# Patient Record
Sex: Male | Born: 1970
Health system: Southern US, Community
[De-identification: ages and names within clinical notes are randomized; demographics above are authoritative.]

## PROBLEM LIST (undated history)

## (undated) DIAGNOSIS — N289 Disorder of kidney and ureter, unspecified: Secondary | ICD-10-CM

## (undated) DIAGNOSIS — I10 Essential (primary) hypertension: Secondary | ICD-10-CM

## (undated) DIAGNOSIS — E118 Type 2 diabetes mellitus with unspecified complications: Secondary | ICD-10-CM

## (undated) DIAGNOSIS — I5032 Chronic diastolic (congestive) heart failure: Secondary | ICD-10-CM

## (undated) HISTORY — PX: OTHER SURGICAL HISTORY: SHX169

## (undated) HISTORY — PX: NO PAST SURGERIES: SHX2092

---

## 2018-10-17 DIAGNOSIS — D649 Anemia, unspecified: Secondary | ICD-10-CM

## 2018-10-17 DIAGNOSIS — I10 Essential (primary) hypertension: Secondary | ICD-10-CM

## 2018-10-17 DIAGNOSIS — E876 Hypokalemia: Secondary | ICD-10-CM

## 2018-10-17 HISTORY — DX: Essential (primary) hypertension: I10

## 2020-05-09 ENCOUNTER — Inpatient Hospital Stay (HOSPITAL_BASED_OUTPATIENT_CLINIC_OR_DEPARTMENT_OTHER)
Admission: EM | Admit: 2020-05-09 | Discharge: 2020-05-15 | DRG: 304 | Disposition: A | Discharge status: Home / Self Care | Payer: Self-pay | Attending: Internal Medicine | Admitting: Internal Medicine

## 2020-05-09 ENCOUNTER — Other Ambulatory Visit: Payer: Self-pay

## 2020-05-09 ENCOUNTER — Encounter (HOSPITAL_BASED_OUTPATIENT_CLINIC_OR_DEPARTMENT_OTHER): Payer: Self-pay | Admitting: Emergency Medicine

## 2020-05-09 ENCOUNTER — Emergency Department (HOSPITAL_BASED_OUTPATIENT_CLINIC_OR_DEPARTMENT_OTHER): Payer: Self-pay

## 2020-05-09 DIAGNOSIS — D649 Anemia, unspecified: Secondary | ICD-10-CM

## 2020-05-09 DIAGNOSIS — Y929 Unspecified place or not applicable: Secondary | ICD-10-CM

## 2020-05-09 DIAGNOSIS — N189 Chronic kidney disease, unspecified: Secondary | ICD-10-CM | POA: Diagnosis present

## 2020-05-09 DIAGNOSIS — I110 Hypertensive heart disease with heart failure: Secondary | ICD-10-CM | POA: Diagnosis present

## 2020-05-09 DIAGNOSIS — I5041 Acute combined systolic (congestive) and diastolic (congestive) heart failure: Secondary | ICD-10-CM | POA: Diagnosis present

## 2020-05-09 DIAGNOSIS — D72829 Elevated white blood cell count, unspecified: Secondary | ICD-10-CM | POA: Diagnosis present

## 2020-05-09 DIAGNOSIS — I248 Other forms of acute ischemic heart disease: Secondary | ICD-10-CM | POA: Diagnosis present

## 2020-05-09 DIAGNOSIS — I129 Hypertensive chronic kidney disease with stage 1 through stage 4 chronic kidney disease, or unspecified chronic kidney disease: Secondary | ICD-10-CM | POA: Diagnosis present

## 2020-05-09 DIAGNOSIS — Z20822 Contact with and (suspected) exposure to covid-19: Secondary | ICD-10-CM | POA: Diagnosis present

## 2020-05-09 DIAGNOSIS — I272 Pulmonary hypertension, unspecified: Secondary | ICD-10-CM | POA: Diagnosis present

## 2020-05-09 DIAGNOSIS — I088 Other rheumatic multiple valve diseases: Secondary | ICD-10-CM | POA: Diagnosis present

## 2020-05-09 DIAGNOSIS — I161 Hypertensive emergency: Principal | ICD-10-CM | POA: Diagnosis present

## 2020-05-09 DIAGNOSIS — I1310 Hypertensive heart and chronic kidney disease without heart failure, with stage 1 through stage 4 chronic kidney disease, or unspecified chronic kidney disease: Secondary | ICD-10-CM

## 2020-05-09 DIAGNOSIS — I43 Cardiomyopathy in diseases classified elsewhere: Secondary | ICD-10-CM | POA: Diagnosis present

## 2020-05-09 DIAGNOSIS — I10 Essential (primary) hypertension: Secondary | ICD-10-CM

## 2020-05-09 DIAGNOSIS — D638 Anemia in other chronic diseases classified elsewhere: Secondary | ICD-10-CM | POA: Diagnosis present

## 2020-05-09 DIAGNOSIS — I119 Hypertensive heart disease without heart failure: Secondary | ICD-10-CM | POA: Diagnosis present

## 2020-05-09 DIAGNOSIS — Z880 Allergy status to penicillin: Secondary | ICD-10-CM

## 2020-05-09 DIAGNOSIS — N289 Disorder of kidney and ureter, unspecified: Secondary | ICD-10-CM

## 2020-05-09 DIAGNOSIS — E538 Deficiency of other specified B group vitamins: Secondary | ICD-10-CM | POA: Diagnosis present

## 2020-05-09 DIAGNOSIS — D509 Iron deficiency anemia, unspecified: Secondary | ICD-10-CM | POA: Diagnosis present

## 2020-05-09 DIAGNOSIS — I169 Hypertensive crisis, unspecified: Secondary | ICD-10-CM | POA: Diagnosis present

## 2020-05-09 DIAGNOSIS — Z8249 Family history of ischemic heart disease and other diseases of the circulatory system: Secondary | ICD-10-CM

## 2020-05-09 DIAGNOSIS — N179 Acute kidney failure, unspecified: Secondary | ICD-10-CM | POA: Insufficient documentation

## 2020-05-09 DIAGNOSIS — Z91120 Patient's intentional underdosing of medication regimen due to financial hardship: Secondary | ICD-10-CM

## 2020-05-09 DIAGNOSIS — E876 Hypokalemia: Secondary | ICD-10-CM

## 2020-05-09 DIAGNOSIS — I77810 Thoracic aortic ectasia: Secondary | ICD-10-CM | POA: Diagnosis present

## 2020-05-09 DIAGNOSIS — T465X6A Underdosing of other antihypertensive drugs, initial encounter: Secondary | ICD-10-CM | POA: Diagnosis present

## 2020-05-09 LAB — BASIC METABOLIC PANEL
Anion gap: 14 (ref 5–15)
BUN: 57 mg/dL — ABNORMAL HIGH (ref 6–20)
CO2: 22 mmol/L (ref 22–32)
Calcium: 9 mg/dL (ref 8.9–10.3)
Chloride: 98 mmol/L (ref 98–111)
Creatinine, Ser: 4.55 mg/dL — ABNORMAL HIGH (ref 0.61–1.24)
GFR calc Af Amer: 16 mL/min — ABNORMAL LOW (ref >60)
GFR calc non Af Amer: 14 mL/min — ABNORMAL LOW (ref >60)
Glucose, Bld: 133 mg/dL — ABNORMAL HIGH (ref 70–99)
Potassium: 2.9 mmol/L — ABNORMAL LOW (ref 3.5–5.1)
Sodium: 134 mmol/L — ABNORMAL LOW (ref 135–145)

## 2020-05-09 LAB — URINALYSIS, COMPLETE (UACMP) WITH MICROSCOPIC
Bilirubin Urine: NEGATIVE
Glucose, UA: 50 mg/dL — AB
Ketones, ur: NEGATIVE mg/dL
Leukocytes,Ua: NEGATIVE
Nitrite: NEGATIVE
Protein, ur: >=300 mg/dL — AB
Specific Gravity, Urine: 1.012 (ref 1.005–1.030)
pH: 6 (ref 5.0–8.0)

## 2020-05-09 LAB — RAPID URINE DRUG SCREEN, HOSP PERFORMED
Amphetamines: NOT DETECTED
Barbiturates: NOT DETECTED
Benzodiazepines: NOT DETECTED
Cocaine: NOT DETECTED
Opiates: NOT DETECTED
Tetrahydrocannabinol: NOT DETECTED

## 2020-05-09 LAB — CBC
HCT: 41.3 % (ref 39.0–52.0)
Hemoglobin: 13.6 g/dL (ref 13.0–17.0)
MCH: 26.4 pg (ref 26.0–34.0)
MCHC: 32.9 g/dL (ref 30.0–36.0)
MCV: 80.2 fL (ref 80.0–100.0)
Platelets: 272 10*3/uL (ref 150–400)
RBC: 5.15 MIL/uL (ref 4.22–5.81)
RDW: 13.3 % (ref 11.5–15.5)
WBC: 15 10*3/uL — ABNORMAL HIGH (ref 4.0–10.5)
nRBC: 0 % (ref 0.0–0.2)

## 2020-05-09 LAB — TROPONIN I (HIGH SENSITIVITY)
Troponin I (High Sensitivity): 105 ng/L (ref <18)
Troponin I (High Sensitivity): 115 ng/L (ref <18)

## 2020-05-09 LAB — D-DIMER, QUANTITATIVE: D-Dimer, Quant: 0.87 ug/mL-FEU — ABNORMAL HIGH (ref 0.00–0.50)

## 2020-05-09 LAB — BRAIN NATRIURETIC PEPTIDE: B Natriuretic Peptide: 2048.3 pg/mL — ABNORMAL HIGH (ref 0.0–100.0)

## 2020-05-09 LAB — SARS CORONAVIRUS 2 BY RT PCR (HOSPITAL ORDER, PERFORMED IN ~~LOC~~ HOSPITAL LAB): SARS Coronavirus 2: NEGATIVE

## 2020-05-09 MED ORDER — LABETALOL HCL 5 MG/ML IV SOLN
10.0000 mg | Freq: Once | INTRAVENOUS | Status: AC
  Administered 2020-05-09: 10 mg via INTRAVENOUS
  Filled 2020-05-09: qty 4

## 2020-05-09 MED ORDER — LABETALOL HCL 5 MG/ML IV SOLN
5.0000 mg | Freq: Once | INTRAVENOUS | Status: AC
  Administered 2020-05-09: 5 mg via INTRAVENOUS
  Filled 2020-05-09: qty 4

## 2020-05-09 MED ORDER — LABETALOL HCL 5 MG/ML IV SOLN
20.0000 mg | Freq: Once | INTRAVENOUS | Status: AC
  Administered 2020-05-09: 20 mg via INTRAVENOUS
  Filled 2020-05-09: qty 4

## 2020-05-09 MED ORDER — ASPIRIN 81 MG PO CHEW
324.0000 mg | CHEWABLE_TABLET | Freq: Once | ORAL | Status: AC
  Administered 2020-05-09: 324 mg via ORAL
  Filled 2020-05-09: qty 4

## 2020-05-09 MED ORDER — LABETALOL HCL 5 MG/ML IV SOLN: 10.0000 mg | INTRAVENOUS | Status: DC | PRN

## 2020-05-09 MED ORDER — POTASSIUM CHLORIDE CRYS ER 20 MEQ PO TBCR
40.0000 meq | EXTENDED_RELEASE_TABLET | Freq: Once | ORAL | Status: AC
  Administered 2020-05-09: 40 meq via ORAL
  Filled 2020-05-09: qty 2

## 2020-05-09 MED ORDER — ONDANSETRON HCL 4 MG PO TABS: 4.0000 mg | ORAL_TABLET | Freq: Four times a day (QID) | ORAL | Status: DC | PRN

## 2020-05-09 MED ORDER — AMLODIPINE BESYLATE 5 MG PO TABS
5.0000 mg | ORAL_TABLET | Freq: Every day | ORAL | Status: DC
  Administered 2020-05-09 – 2020-05-10 (×2): 5 mg via ORAL
  Filled 2020-05-09 (×2): qty 1

## 2020-05-09 MED ORDER — ONDANSETRON HCL 4 MG/2ML IJ SOLN: 4.0000 mg | Freq: Four times a day (QID) | INTRAMUSCULAR | Status: DC | PRN

## 2020-05-09 MED ORDER — ACETAMINOPHEN 325 MG PO TABS
650.0000 mg | ORAL_TABLET | Freq: Four times a day (QID) | ORAL | Status: DC | PRN
  Administered 2020-05-10 – 2020-05-15 (×3): 650 mg via ORAL
  Filled 2020-05-09 (×3): qty 2

## 2020-05-09 MED ORDER — ACETAMINOPHEN 650 MG RE SUPP: 650.0000 mg | Freq: Four times a day (QID) | RECTAL | Status: DC | PRN

## 2020-05-09 MED ORDER — HEPARIN SODIUM (PORCINE) 5000 UNIT/ML IJ SOLN
5000.0000 [IU] | Freq: Three times a day (TID) | INTRAMUSCULAR | Status: DC
  Administered 2020-05-09 – 2020-05-15 (×14): 5000 [IU] via SUBCUTANEOUS
  Filled 2020-05-09 (×15): qty 1

## 2020-05-09 MED ORDER — HYDRALAZINE HCL 20 MG/ML IJ SOLN
5.0000 mg | Freq: Once | INTRAMUSCULAR | Status: AC
  Administered 2020-05-09: 5 mg via INTRAVENOUS
  Filled 2020-05-09: qty 1

## 2020-05-09 NOTE — ED Provider Notes (Signed)
[facility] EMERGENCY DEPARTMENT Provider Note   CSN: [id number] Arrival date & time: 05/09/20  1213     History Chief Complaint  Patient presents with  . Hypertension    Bobby Stevens is a 49 y.o. male.  Patient is a 50 year old gentleman with past medical history of hypertension presenting to the emergency department for elevated blood pressure.  Patient reports that he has not been taking any of his blood pressure medication since at least before April.  Reports that he could not afford the follow-up visits with his primary medical doctor.  Reports that the last couple of days he has been feeling generally weak and lightheaded.  He does endorse orthopnea and dyspnea on exertion.  Denies any chest pain or current shortness of breath.  Denies any leg swelling, coughing, fever, chills.        Past Medical History:  Diagnosis Date  . Hypertension     There are no problems to display for this patient.   History reviewed. No pertinent surgical history.     No family history on file.  Social History   Tobacco Use  . Smoking status: Never Smoker  . Smokeless tobacco: Never Used  Substance Use Topics  . Alcohol use: Not Currently  . Drug use: Never    Home Medications Prior to Admission medications   Not on File    Allergies    Penicillins  Review of Systems   Review of Systems  Constitutional: Positive for fatigue. Negative for appetite change, chills and fever.  HENT: Negative.   Respiratory: Positive for shortness of breath. Negative for cough.   Cardiovascular: Negative for chest pain, palpitations and leg swelling.  Gastrointestinal: Negative for abdominal pain, nausea and vomiting.  Genitourinary: Negative for dysuria.  Musculoskeletal: Negative for back pain.  Skin: Negative for rash.  Neurological: Positive for light-headedness. Negative for dizziness, syncope and headaches.    Physical Exam Updated Vital Signs BP (!) 205/140    Pulse 83   Temp 98.9 F (37.2 C) (Oral)   Resp (!) 31   Ht 6\' 1"  (1.854 m)   Wt 84.9 kg   SpO2 96%   BMI 24.70 kg/m   Physical Exam Vitals and nursing note reviewed.  Constitutional:      General: He is not in acute distress.    Appearance: Normal appearance. He is not ill-appearing, toxic-appearing or diaphoretic.  HENT:     Head: Normocephalic.     Mouth/Throat:     Mouth: Mucous membranes are moist.  Eyes:     Conjunctiva/sclera: Conjunctivae normal.  Cardiovascular:     Rate and Rhythm: Regular rhythm. Tachycardia present.     Pulses: Normal pulses.     Heart sounds: No murmur heard.   Pulmonary:     Effort: Pulmonary effort is normal.     Breath sounds: Normal breath sounds.  Abdominal:     General: Abdomen is flat.  Musculoskeletal:     Right lower leg: No edema.     Left lower leg: No edema.  Skin:    General: Skin is warm and dry.  Neurological:     Mental Status: He is alert and oriented to person, place, and time.  Psychiatric:        Mood and Affect: Mood normal.     ED Results / Procedures / Treatments   Labs (all labs ordered are listed, but only abnormal results are displayed) Labs Reviewed  BASIC METABOLIC PANEL - Abnormal; Notable for the  following components:      Result Value   Sodium 134 (*)    Potassium 2.9 (*)    Glucose, Bld 133 (*)    BUN 57 (*)    Creatinine, Ser 4.55 (*)    GFR calc non Af Amer 14 (*)    GFR calc Af Amer 16 (*)    All other components within normal limits  CBC - Abnormal; Notable for the following components:   WBC 15.0 (*)    All other components within normal limits  BRAIN NATRIURETIC PEPTIDE - Abnormal; Notable for the following components:   B Natriuretic Peptide 2,048.3 (*)    All other components within normal limits  D-DIMER, QUANTITATIVE (NOT AT [facility]) - Abnormal; Notable for the following components:   D-Dimer, Quant 0.87 (*)    All other components within normal limits  TROPONIN I (HIGH SENSITIVITY) -  Abnormal; Notable for the following components:   Troponin I (High Sensitivity) 105 (*)    All other components within normal limits    EKG EKG Interpretation  Date/Time:  Saturday May 09 2020 12:20:47 EDT Ventricular Rate:  115 PR Interval:  136 QRS Duration: 92 QT Interval:  348 QTC Calculation: 481 R Axis:   9 Text Interpretation: Sinus tachycardia Biatrial enlargement Left ventricular hypertrophy ( R in aVL , Sokolow-Lyon , Cornell product , Romhilt-Estes ) Nonspecific ST and T wave abnormality Abnormal ECG No old tracing to compare Confirmed by Sherwood Gambler [phone]) on 05/09/2020 12:39:05 PM   Radiology DG Chest Port 1 View  Result Date: 05/09/2020 CLINICAL DATA:  Generalized weakness and fatigue for 2 weeks, hypertension EXAM: PORTABLE CHEST 1 VIEW COMPARISON:  Portable exam 1247 hours without priors for comparison FINDINGS: Enlargement of cardiac silhouette. Mediastinal contours and pulmonary vascularity normal. Minimal RIGHT basilar atelectasis. Remaining lungs clear. No infiltrate, pleural effusion or pneumothorax. Osseous structures unremarkable. IMPRESSION: Enlargement of cardiac silhouette with minimal RIGHT basilar atelectasis. Electronically Signed   By: Lavonia Dana M.D.   On: 05/09/2020 13:18    Procedures Procedures (including critical care time)  Medications Ordered in ED Medications  hydrALAZINE (APRESOLINE) injection 5 mg (5 mg Intravenous Given 05/09/20 1247)  aspirin chewable tablet 324 mg (324 mg Oral Given 05/09/20 1246)  labetalol (NORMODYNE) injection 5 mg (5 mg Intravenous Given 05/09/20 1334)  labetalol (NORMODYNE) injection 10 mg (10 mg Intravenous Given 05/09/20 1346)  potassium chloride SA (KLOR-CON) CR tablet 40 mEq (40 mEq Oral Given 05/09/20 1344)    ED Course  I have reviewed the triage vital signs and the nursing notes.  Pertinent labs & imaging results that were available during my care of the patient were reviewed by me and considered in my  medical decision making (see chart for details).  Clinical Course as of May 09 1748  Sat May 09, 2020  1328 Patient with hx of hypertension off BP meds for months presenting with lightheadedness, SOB for the last new days. Patient significantly hypertensive to 246/181, tachycardica on arrival but no acute distress. Labs reveal elevated BNP over 2k, trop of 105 and AKI with creatinin of 4.55.  He was initially given hydralazine which very slightly decrease his blood pressure but he still remains hypertensive and tachycardic.  Labetalol is ordered.  Awaiting consult from cardiology.  Likely will be a medicine admit for further work-up and treatment of hypertensive emergency.   [KM]  2952 I discussed with cardiology who feel this may all be primary renal failure causing his elevated BP and  trop. Advised to admit to medicine and they will consult on him as well when he arrives to [facility]. Bps are responding to labetalol, now with a map of 159    [KM]  1425 Spoke with Dr. Doristine Bosworth to admit this patient to medicine   [KM]  1712 BP(!): 204/143 [KM]  1712 MAP (mmHg): 161 [KM]    Clinical Course User Index [KM] Kristine Royal   MDM Rules/Calculators/A&P                           CRITICAL CARE Performed by: Alveria Apley   Total critical care time: 35 minutes  Critical care time was exclusive of separately billable procedures and treating other patients.  Critical care was necessary to treat or prevent imminent or life-threatening deterioration.  Critical care was time spent personally by me on the following activities: development of treatment plan with patient and/or surrogate as well as nursing, discussions with consultants, evaluation of patient's response to treatment, examination of patient, obtaining history from patient or surrogate, ordering and performing treatments and interventions, ordering and review of laboratory studies, ordering and review of radiographic studies, pulse  oximetry and re-evaluation of patient's condition.  Final Clinical Impression(s) / ED Diagnoses Final diagnoses:  Hypertensive emergency    Rx / DC Orders ED Discharge Orders    None       Alveria Apley, PA-C 05/09/20 1749    Sherwood Gambler, MD 05/13/20 [phone]

## 2020-05-09 NOTE — ED Triage Notes (Addendum)
Pt reports feeling unwell x 2 weeks. He has been out of BP meds for 6 months. Endorses SOB

## 2020-05-09 NOTE — Progress Notes (Signed)
Patient introduced to room, vitals taken, paged doctor of his arrival.

## 2020-05-09 NOTE — ED Notes (Signed)
ED Provider at bedside. 

## 2020-05-09 NOTE — H&P (Signed)
History and Physical    Hardy Harcum KYH:[id number] DOB: November 04, 1970 DOA: 05/09/2020  PCP: System, Pcp Not In  Patient coming from: Home  I have personally briefly reviewed patient's old medical records in [place]  Chief Complaint: HTN  HPI: Bobby Stevens is a 49 y.o. male with medical history significant of HTN, hasnt seen PCP in a long time, hasnt been on BP meds since at least before April, couldn't afford follow ups.  Pt presents to ED: Reports that the last couple of days he has been feeling generally weak and lightheaded.  He does endorse orthopnea and dyspnea on exertion.  Denies any chest pain or current shortness of breath.  Denies any leg swelling, coughing, fever, chills.   ED Course: initial BP 240/185.  Multiple readings confirm that this is correct with SBP as high as 260.  Also initially mild S.Tach with HR 117.  BNP 2000, trop 105 and 115.  Creat 4.55 BUN 57.  Pt reports no changes in UOP recently.  Given escalating doses of Labetalol, BP down to 200/140 by time of admission and HR 87.   Review of Systems: As per HPI, otherwise all review of systems negative.  Past Medical History:  Diagnosis Date  . Hypertension     History reviewed. No pertinent surgical history.   reports that he has never smoked. He has never used smokeless tobacco. He reports previous alcohol use. He reports that he does not use drugs.  Allergies  Allergen Reactions  . Penicillins     No family history on file.   Prior to Admission medications   Not on File    Physical Exam: Vitals:   05/09/20 1756 05/09/20 1759 05/09/20 1850 05/09/20 1853  BP:  (!) 207/159  (!) 198/141  Pulse:  89  92  Resp:  (!) 29  20  Temp: 98.4 F (36.9 C)   98.4 F (36.9 C)  TempSrc: Oral   Oral  SpO2:  99%  100%  Weight:   83.8 kg   Height:   6\' 1"  (1.854 m)     Constitutional: NAD, calm, comfortable Eyes: PERRL, lids and conjunctivae normal ENMT: Mucous membranes are  moist. Posterior pharynx clear of any exudate or lesions.Normal dentition.  Neck: normal, supple, no masses, no thyromegaly Respiratory: clear to auscultation bilaterally, no wheezing, no crackles. Normal respiratory effort. No accessory muscle use.  Cardiovascular: Regular rate and rhythm, no murmurs / rubs / gallops. No extremity edema. 2+ pedal pulses. No carotid bruits.  Abdomen: no tenderness, no masses palpated. No hepatosplenomegaly. Bowel sounds positive.  Musculoskeletal: no clubbing / cyanosis. No joint deformity upper and lower extremities. Good ROM, no contractures. Normal muscle tone.  Skin: no rashes, lesions, ulcers. No induration Neurologic: CN 2-12 grossly intact. Sensation intact, DTR normal. Strength 5/5 in all 4.  Psychiatric: Normal judgment and insight. Alert and oriented x 3. Normal mood.    Labs on Admission: I have personally reviewed following labs and imaging studies  CBC: Recent Labs  Lab 05/09/20 1240  WBC 15.0*  HGB 13.6  HCT 41.3  MCV 80.2  PLT 151   Basic Metabolic Panel: Recent Labs  Lab 05/09/20 1240  NA 134*  K 2.9*  CL 98  CO2 22  GLUCOSE 133*  BUN 57*  CREATININE 4.55*  CALCIUM 9.0   GFR: Estimated Creatinine Clearance: 22.2 mL/min (A) (by C-G formula based on SCr of 4.55 mg/dL (H)). Liver Function Tests: No results for input(s): AST, ALT, ALKPHOS, BILITOT,  PROT, ALBUMIN in the last 168 hours. No results for input(s): LIPASE, AMYLASE in the last 168 hours. No results for input(s): AMMONIA in the last 168 hours. Coagulation Profile: No results for input(s): INR, PROTIME in the last 168 hours. Cardiac Enzymes: No results for input(s): CKTOTAL, CKMB, CKMBINDEX, TROPONINI in the last 168 hours. BNP (last 3 results) No results for input(s): PROBNP in the last 8760 hours. HbA1C: No results for input(s): HGBA1C in the last 72 hours. CBG: No results for input(s): GLUCAP in the last 168 hours. Lipid Profile: No results for input(s):  CHOL, HDL, LDLCALC, TRIG, CHOLHDL, LDLDIRECT in the last 72 hours. Thyroid Function Tests: No results for input(s): TSH, T4TOTAL, FREET4, T3FREE, THYROIDAB in the last 72 hours. Anemia Panel: No results for input(s): VITAMINB12, FOLATE, FERRITIN, TIBC, IRON, RETICCTPCT in the last 72 hours. Urine analysis: No results found for: COLORURINE, APPEARANCEUR, LABSPEC, [place], GLUCOSEU, HGBUR, BILIRUBINUR, KETONESUR, PROTEINUR, UROBILINOGEN, NITRITE, LEUKOCYTESUR  Radiological Exams on Admission: DG Chest Port 1 View  Result Date: 05/09/2020 CLINICAL DATA:  Generalized weakness and fatigue for 2 weeks, hypertension EXAM: PORTABLE CHEST 1 VIEW COMPARISON:  Portable exam 1247 hours without priors for comparison FINDINGS: Enlargement of cardiac silhouette. Mediastinal contours and pulmonary vascularity normal. Minimal RIGHT basilar atelectasis. Remaining lungs clear. No infiltrate, pleural effusion or pneumothorax. Osseous structures unremarkable. IMPRESSION: Enlargement of cardiac silhouette with minimal RIGHT basilar atelectasis. Electronically Signed   By: Lavonia Dana M.D.   On: 05/09/2020 13:18    EKG: Independently reviewed.  Assessment/Plan Principal Problem:   Hypertensive emergency Active Problems:   Renal insufficiency   Hypertensive nephropathy   Hypertensive cardiomyopathy ([place])    1. HTN emergency - 1. Given renal failure, this qualifies as HTN emergency (as opposed to urgency) 2. Goal BP for first 24h 200/140. 3. Start amlodipine 5mg  (will undoubtedly need to increase) 1. Cant do ACEi due to Creat 4.5 2. Dont want to do diuretic due to Creat 4.5 4. PRN labetalol 5. Tele monitor 2. Renal insufficiency - 1. No prior recs available, have to assume that there is an acute component, though this may just be chronic and severe hypertensive nephropathy (in which case it would be CKD 4-5 if this is his baseline). 2. Strict intake and output 3. Check UA with microscopic. 4. Repeat BMP  in AM 5. BP control as above 6. Renal US 7. Needs formal nephrology consult in AM 3. Suspect hypertensive cardiomyopathy - 1. Enlarged heart, positive trops, and EKG hypertrophic findings 2. Getting 2d echo 3. EDP spoke with cards 4. BP control as above  DVT prophylaxis: Heparin [place] Code Status: Full Family Communication: No family in room Disposition Plan: Home after BP controlled, cardiac and renal work ups Consults called: EDP spoke with cards, call nephrology for formal consult in AM Admission status: Admit to inpatient  Severity of Illness: The appropriate patient status for this patient is INPATIENT. Inpatient status is judged to be reasonable and necessary in order to provide the required intensity of service to ensure the patient's safety. The patient's presenting symptoms, physical exam findings, and initial radiographic and laboratory data in the context of their chronic comorbidities is felt to place them at high risk for further clinical deterioration. Furthermore, it is not anticipated that the patient will be medically stable for discharge from the hospital within 2 midnights of admission. The following factors support the patient status of inpatient.   IP status due to hypertensive emergency with renal failure.  Must be assumed to  be acute until we know otherwise.   * I certify that at the point of admission it is my clinical judgment that the patient will require inpatient hospital care spanning beyond 2 midnights from the point of admission due to high intensity of service, high risk for further deterioration and high frequency of surveillance required.*    Rachna Schonberger M. DO [facility]  How to contact the [facility] Attending or Consulting provider [place] or covering provider during after hours [place], for this patient?  1. Check the care team in [facility] and look for a) attending/consulting [facility] provider listed and b) the [facility] team listed 2. Log into [email or web address]    Amion Physician Scheduling and messaging for groups and whole hospitals  On call and physician scheduling software for group practices, residents, hospitalists and other medical providers for call, clinic, rotation and shift schedules. On[employer] is a hospital-wide system for scheduling doctors and paging doctors on call. EasyPlot is for scientific plotting and data analysis.  [email or web address]  and use [place]'s universal password to access. If you do not have the password, please contact the hospital operator.  3. Locate the [facility] provider you are looking for under [facility] and page to a number that you can be directly reached. 4. If you still have difficulty reaching the provider, please page the [facility] (Director on Call) for the Hospitalists listed on amion for assistance.  05/09/2020, 8:15 PM

## 2020-05-09 NOTE — ED Notes (Signed)
Troponin 105, results given to ED PA

## 2020-05-10 ENCOUNTER — Inpatient Hospital Stay (HOSPITAL_COMMUNITY): Payer: Self-pay

## 2020-05-10 ENCOUNTER — Encounter (HOSPITAL_COMMUNITY): Payer: Self-pay | Admitting: Internal Medicine

## 2020-05-10 DIAGNOSIS — D649 Anemia, unspecified: Secondary | ICD-10-CM

## 2020-05-10 DIAGNOSIS — I371 Nonrheumatic pulmonary valve insufficiency: Secondary | ICD-10-CM

## 2020-05-10 DIAGNOSIS — I340 Nonrheumatic mitral (valve) insufficiency: Secondary | ICD-10-CM

## 2020-05-10 DIAGNOSIS — I43 Cardiomyopathy in diseases classified elsewhere: Secondary | ICD-10-CM

## 2020-05-10 DIAGNOSIS — N179 Acute kidney failure, unspecified: Secondary | ICD-10-CM

## 2020-05-10 DIAGNOSIS — I161 Hypertensive emergency: Principal | ICD-10-CM

## 2020-05-10 DIAGNOSIS — I129 Hypertensive chronic kidney disease with stage 1 through stage 4 chronic kidney disease, or unspecified chronic kidney disease: Secondary | ICD-10-CM

## 2020-05-10 DIAGNOSIS — N289 Disorder of kidney and ureter, unspecified: Secondary | ICD-10-CM

## 2020-05-10 DIAGNOSIS — I361 Nonrheumatic tricuspid (valve) insufficiency: Secondary | ICD-10-CM

## 2020-05-10 DIAGNOSIS — I110 Hypertensive heart disease with heart failure: Secondary | ICD-10-CM

## 2020-05-10 LAB — HEPATIC FUNCTION PANEL
ALT: 26 U/L (ref 0–44)
AST: 31 U/L (ref 15–41)
Albumin: 3.1 g/dL — ABNORMAL LOW (ref 3.5–5.0)
Alkaline Phosphatase: 59 U/L (ref 38–126)
Bilirubin, Direct: 0.4 mg/dL — ABNORMAL HIGH (ref 0.0–0.2)
Indirect Bilirubin: 0.9 mg/dL (ref 0.3–0.9)
Total Bilirubin: 1.3 mg/dL — ABNORMAL HIGH (ref 0.3–1.2)
Total Protein: 6.2 g/dL — ABNORMAL LOW (ref 6.5–8.1)

## 2020-05-10 LAB — MAGNESIUM: Magnesium: 2.1 mg/dL (ref 1.7–2.4)

## 2020-05-10 LAB — BASIC METABOLIC PANEL
Anion gap: 9 (ref 5–15)
BUN: 53 mg/dL — ABNORMAL HIGH (ref 6–20)
CO2: 25 mmol/L (ref 22–32)
Calcium: 8.8 mg/dL — ABNORMAL LOW (ref 8.9–10.3)
Chloride: 102 mmol/L (ref 98–111)
Creatinine, Ser: 4.61 mg/dL — ABNORMAL HIGH (ref 0.61–1.24)
GFR calc Af Amer: 16 mL/min — ABNORMAL LOW (ref >60)
GFR calc non Af Amer: 14 mL/min — ABNORMAL LOW (ref >60)
Glucose, Bld: 107 mg/dL — ABNORMAL HIGH (ref 70–99)
Potassium: 2.8 mmol/L — ABNORMAL LOW (ref 3.5–5.1)
Sodium: 136 mmol/L (ref 135–145)

## 2020-05-10 LAB — CBC
HCT: 36.6 % — ABNORMAL LOW (ref 39.0–52.0)
Hemoglobin: 12 g/dL — ABNORMAL LOW (ref 13.0–17.0)
MCH: 26.7 pg (ref 26.0–34.0)
MCHC: 32.8 g/dL (ref 30.0–36.0)
MCV: 81.3 fL (ref 80.0–100.0)
Platelets: 228 10*3/uL (ref 150–400)
RBC: 4.5 MIL/uL (ref 4.22–5.81)
RDW: 13.6 % (ref 11.5–15.5)
WBC: 12.2 10*3/uL — ABNORMAL HIGH (ref 4.0–10.5)
nRBC: 0 % (ref 0.0–0.2)

## 2020-05-10 LAB — HEPATITIS C ANTIBODY: HCV Ab: NONREACTIVE

## 2020-05-10 LAB — IRON AND TIBC
Iron: 29 ug/dL — ABNORMAL LOW (ref 45–182)
Saturation Ratios: 9 % — ABNORMAL LOW (ref 17.9–39.5)
TIBC: 332 ug/dL (ref 250–450)
UIBC: 303 ug/dL

## 2020-05-10 LAB — NA AND K (SODIUM & POTASSIUM), RAND UR
Potassium Urine: 39 mmol/L
Sodium, Ur: 48 mmol/L

## 2020-05-10 LAB — ECHOCARDIOGRAM COMPLETE
Area-P 1/2: 4.04 cm2
Height: 73 in
S' Lateral: 4.2 cm
Weight: 2924.8 oz

## 2020-05-10 LAB — PROTEIN / CREATININE RATIO, URINE
Creatinine, Urine: 73.64 mg/dL
Protein Creatinine Ratio: 7.71 mg/mg{Cre} — ABNORMAL HIGH (ref 0.00–0.15)
Total Protein, Urine: 568 mg/dL

## 2020-05-10 LAB — VITAMIN B12: Vitamin B-12: 328 pg/mL (ref 180–914)

## 2020-05-10 LAB — HEPATITIS B CORE ANTIBODY, TOTAL: Hep B Core Total Ab: NONREACTIVE

## 2020-05-10 LAB — RAPID HIV SCREEN (HIV 1/2 AB+AG)
HIV 1/2 Antibodies: NONREACTIVE
HIV-1 P24 Antigen - HIV24: NONREACTIVE

## 2020-05-10 LAB — OSMOLALITY, URINE: Osmolality, Ur: 440 mOsm/kg (ref 300–900)

## 2020-05-10 LAB — TROPONIN I (HIGH SENSITIVITY): Troponin I (High Sensitivity): 79 ng/L — ABNORMAL HIGH (ref <18)

## 2020-05-10 LAB — FERRITIN: Ferritin: 118 ng/mL (ref 24–336)

## 2020-05-10 LAB — FOLATE: Folate: 16.5 ng/mL (ref >5.9)

## 2020-05-10 LAB — CREATININE, URINE, RANDOM: Creatinine, Urine: 75.3 mg/dL

## 2020-05-10 LAB — CK: Total CK: 128 U/L (ref 49–397)

## 2020-05-10 LAB — HEPATITIS B SURFACE ANTIGEN: Hepatitis B Surface Ag: NONREACTIVE

## 2020-05-10 LAB — HIV ANTIBODY (ROUTINE TESTING W REFLEX): HIV Screen 4th Generation wRfx: NONREACTIVE

## 2020-05-10 LAB — BRAIN NATRIURETIC PEPTIDE: B Natriuretic Peptide: 1827 pg/mL — ABNORMAL HIGH (ref 0.0–100.0)

## 2020-05-10 MED ORDER — ISOSORBIDE DINITRATE 10 MG PO TABS
20.0000 mg | ORAL_TABLET | Freq: Three times a day (TID) | ORAL | Status: DC
  Administered 2020-05-10 – 2020-05-11 (×4): 20 mg via ORAL
  Filled 2020-05-10 (×4): qty 2

## 2020-05-10 MED ORDER — HYDRALAZINE HCL 50 MG PO TABS
50.0000 mg | ORAL_TABLET | Freq: Three times a day (TID) | ORAL | Status: DC
  Administered 2020-05-10 – 2020-05-11 (×4): 50 mg via ORAL
  Filled 2020-05-10 (×4): qty 1

## 2020-05-10 MED ORDER — POTASSIUM CHLORIDE CRYS ER 20 MEQ PO TBCR
40.0000 meq | EXTENDED_RELEASE_TABLET | ORAL | Status: AC
  Administered 2020-05-10 (×3): 40 meq via ORAL
  Filled 2020-05-10 (×3): qty 2

## 2020-05-10 NOTE — Assessment & Plan Note (Addendum)
-   differential wide at this time; greatly appreciate nephrology assistance. Differential includes hypertensive nephropathy vs ATN 2/2 sustained pre-renal vs autoimmune process - Prot:creat = 7.71 consistent with nephrotic range - continue following nephrology workup - trend BMP - BP too high for kidney biopsy, but nephrology planning to order once BP more acceptable and safe range  - creatinine still uptrending since admission  - also possible trial of amiloride once renal function improves per nephrology

## 2020-05-10 NOTE — Progress Notes (Signed)
PROGRESS NOTE    Kiaan Overholser   YQM:[id number]  DOB: 1970-11-10  DOA: 05/09/2020     1  PCP: System, Pcp Not In  CC: elevated BP at home, weakness, lightheadedness  Hospital Course: Mr. Alper is a 49 yo AA male with PMH HTN (severely uncontrolled), medication noncompliance 2/2 financial constraints who presented to the ER with complaints of feeling weak and lightheaded.  He was also short of breath with minimal exertion and also describing symptoms of orthopnea. His blood pressure was excessively elevated on admission, 246/181 initially.  He sustained diastolic blood pressures in the 150s to 180s for several hours. His mother the following morning from admission also stated that his blood pressures have been running that high at home as well.  They knew it was high but due to him being relatively asymptomatic initially over the past several months, did not put much thought into it. On lab work-up he was notable to have elevated BUN and creatinine, 57 and 4.55 respectively. Troponins were also checked and trended.  105, 115, 79. BNP was also elevated.  2048, 1827. EKG initially in the ER revealed sinus tachycardia with significant LVH. An echo was also obtained which showed reduced EF, 25 to 30% and global hypokinesis of the left ventricle with LVH.  Grade 3 diastolic dysfunction.  Right ventricular systolic function was also reduced.  Biatrial enlargement.  Cardiology and nephrology were consulted for further input and evaluation.  Goal was for gradual BP reduction. He was started on amlodipine, hydralazine, and isordil. He also underwent further workup from nephrology regarding other causes of his renal failure aside from uncontrolled HTN.    Interval History:  Admitted overnight with elevated BP. Found to be in hypertensive emergency.   He has not been able to afford his medications in over 1 year.  Mother was present at bedside this morning and also confirms his excessively  elevated blood pressure at home.  He is overall relatively asymptomatic sitting up in bed with no chest pain or shortness of breath at rest.  When discussing how high his blood pressure was, he did not seem surprised as he does see these numbers at home.  His mom bedside also did not appreciate how high his blood pressure numbers are, again as they were used to seeing these numbers and he was relatively asymptomatic. He does state that his urine output has decreased a little.   Old records reviewed in assessment of this patient  ROS: Constitutional: negative for chills and fevers, Respiratory: positive for Shortness of breath, Cardiovascular: positive for chest pressure/discomfort and Gastrointestinal: negative for abdominal pain  Assessment & Plan: Hypertensive emergency - elevated troponin and creatinine on admission consistent with end organ failure/damage; still unclear baseline renal function - appreciate cardiology involvement in lower of BP; continue current regimen with adjustment as needed; patient started on amlodipine, hydralazine, isordil per cardiology; adjustment and timing of BP lowering deferred to cardiology   AKI (acute kidney injury) ([place]) - differential wide at this time; greatly appreciate nephrology assistance. Differential includes hypertensive nephropathy vs ATN 2/2 sustained pre-renal vs autoimmune process - Prot:creat = 7.71 consistent with nephrotic range - continue following nephrology workup - trend BMP - BP too high for kidney biopsy, but nephrology planning to order once BP more acceptable and safe range   Hypertensive nephropathy - see AKI  Hypertensive cardiomyopathy ([place]) - LVH noted on EKG and echo; EF 25-30% with global hypokinesis of the left ventricle with LVH.  Grade 3  diastolic dysfunction.  Right ventricular systolic function was also reduced.  Biatrial enlargement. - not a cath candidate due to renal function - cardiology following and for now will  be medical management until if/when renal function allows further ischemic and/or pulm HTN workup.   Hypokalemia - unclear etiology - check Mg - replete and recheck as needed  Normocytic anemia - more than likely is ACD; however may also be mixed - check iron studies and B12, folate    Antimicrobials: None  DVT prophylaxis: H SQ Code Status: Full Family Communication: Mother bedside Disposition Plan:  Status is: Inpatient  Remains inpatient appropriate because:Hemodynamically unstable, Ongoing diagnostic testing needed not appropriate for outpatient work up, Unsafe d/c plan, IV treatments appropriate due to intensity of illness or inability to take PO and Inpatient level of care appropriate due to severity of illness   Dispo: The patient is from: Home              Anticipated d/c is to: Home              Anticipated d/c date is: > 3 days              Patient currently is not medically stable to d/c.       Objective: Blood pressure (!) 170/119, pulse 100, temperature 98.2 F (36.8 C), temperature source Oral, resp. rate 18, height 6\' 1"  (1.854 m), weight 82.9 kg, SpO2 98 %.  Examination: General appearance: alert, cooperative and appears stated age Head: Normocephalic, without obvious abnormality, atraumatic Eyes: EOMI Lungs: clear to auscultation bilaterally Heart: regular rate and rhythm and S1, S2 normal Abdomen: normal findings: bowel sounds normal and soft, non-tender Extremities: No significant lower extremity edema Skin: mobility and turgor normal Neurologic: Grossly normal   Consultants:   Cardiology  Nephrology  Procedures:   None  Data Reviewed: I have personally reviewed following labs and imaging studies Results for orders placed or performed during the hospital encounter of 05/09/20 (from the past 24 hour(s))  Urinalysis, Complete w Microscopic     Status: Abnormal   Collection Time: 05/09/20  8:51 PM  Result Value Ref Range   Color, Urine  YELLOW YELLOW   APPearance CLEAR CLEAR   Specific Gravity, Urine 1.012 1.005 - 1.030   pH 6.0 5.0 - 8.0   Glucose, UA 50 (A) NEGATIVE mg/dL   Hgb urine dipstick SMALL (A) NEGATIVE   Bilirubin Urine NEGATIVE NEGATIVE   Ketones, ur NEGATIVE NEGATIVE mg/dL   Protein, ur >=300 (A) NEGATIVE mg/dL   Nitrite NEGATIVE NEGATIVE   Leukocytes,Ua NEGATIVE NEGATIVE   RBC / HPF 11-20 0 - 5 RBC/hpf   WBC, UA 0-5 0 - 5 WBC/hpf   Bacteria, UA RARE (A) NONE SEEN   Hyaline Casts, UA PRESENT   HIV Antibody (routine testing w rflx)     Status: None   Collection Time: 05/10/20  5:07 AM  Result Value Ref Range   HIV Screen 4th Generation wRfx Non Reactive Non Reactive  CBC     Status: Abnormal   Collection Time: 05/10/20  5:07 AM  Result Value Ref Range   WBC 12.2 (H) 4.0 - 10.5 K/uL   RBC 4.50 4.22 - 5.81 MIL/uL   Hemoglobin 12.0 (L) 13.0 - 17.0 g/dL   HCT 36.6 (L) 39 - 52 %   MCV 81.3 80.0 - 100.0 fL   MCH 26.7 26.0 - 34.0 pg   MCHC 32.8 30.0 - 36.0 g/dL   RDW 13.6 11.5 -  15.5 %   Platelets 228 150 - 400 K/uL   nRBC 0.0 0.0 - 0.2 %  Basic metabolic panel     Status: Abnormal   Collection Time: 05/10/20  5:07 AM  Result Value Ref Range   Sodium 136 135 - 145 mmol/L   Potassium 2.8 (L) 3.5 - 5.1 mmol/L   Chloride 102 98 - 111 mmol/L   CO2 25 22 - 32 mmol/L   Glucose, Bld 107 (H) 70 - 99 mg/dL   BUN 53 (H) 6 - 20 mg/dL   Creatinine, Ser 4.61 (H) 0.61 - 1.24 mg/dL   Calcium 8.8 (L) 8.9 - 10.3 mg/dL   GFR calc non Af Amer 14 (L) >60 mL/min   GFR calc Af Amer 16 (L) >60 mL/min   Anion gap 9 5 - 15  Magnesium     Status: None   Collection Time: 05/10/20  8:07 AM  Result Value Ref Range   Magnesium 2.1 1.7 - 2.4 mg/dL  Troponin I (High Sensitivity)     Status: Abnormal   Collection Time: 05/10/20  8:07 AM  Result Value Ref Range   Troponin I (High Sensitivity) 79 (H) <18 ng/L  Brain natriuretic peptide     Status: Abnormal   Collection Time: 05/10/20  8:07 AM  Result Value Ref Range   B  Natriuretic Peptide 1,827.0 (H) 0.0 - 100.0 pg/mL  Hepatic function panel     Status: Abnormal   Collection Time: 05/10/20  8:07 AM  Result Value Ref Range   Total Protein 6.2 (L) 6.5 - 8.1 g/dL   Albumin 3.1 (L) 3.5 - 5.0 g/dL   AST 31 15 - 41 U/L   ALT 26 0 - 44 U/L   Alkaline Phosphatase 59 38 - 126 U/L   Total Bilirubin 1.3 (H) 0.3 - 1.2 mg/dL   Bilirubin, Direct 0.4 (H) 0.0 - 0.2 mg/dL   Indirect Bilirubin 0.9 0.3 - 0.9 mg/dL  CK     Status: None   Collection Time: 05/10/20  1:00 PM  Result Value Ref Range   Total CK 128 49.0 - 397.0 U/L    Recent Results (from the past 240 hour(s))  SARS Coronavirus 2 by RT PCR (hospital order, performed in [place] hospital lab) Nasopharyngeal Nasopharyngeal Swab     Status: None   Collection Time: 05/09/20  2:59 PM   Specimen: Nasopharyngeal Swab  Result Value Ref Range Status   SARS Coronavirus 2 NEGATIVE NEGATIVE Final    Comment: (NOTE) SARS-CoV-2 target nucleic acids are NOT DETECTED.  The SARS-CoV-2 RNA is generally detectable in upper and lower respiratory specimens during the acute phase of infection. The lowest concentration of SARS-CoV-2 viral copies this assay can detect is 250 copies / mL. A negative result does not preclude SARS-CoV-2 infection and should not be used as the sole basis for treatment or other patient management decisions.  A negative result may occur with improper specimen collection / handling, submission of specimen other than nasopharyngeal swab, presence of viral mutation(s) within the areas targeted by this assay, and inadequate number of viral copies (<250 copies / mL). A negative result must be combined with clinical observations, patient history, and epidemiological information.  Fact Sheet for Patients:   StrictlyIdeas.no  Fact Sheet for Healthcare Providers: BankingDealers.co.za  This test is not yet approved or  cleared by the [place] FDA  and has been authorized for detection and/or diagnosis of SARS-CoV-2 by FDA under an Emergency Use Authorization (EUA).  This EUA will remain in effect (meaning this test can be used) for the duration of the COVID-19 declaration under Section 564(b)(1) of the Act, 21 U.S.C. section 360bbb-3(b)(1), unless the authorization is terminated or revoked sooner.  Performed at [facility], [address], [place], [place] [address]      Radiology Studies: US RENAL  Result Date: 05/10/2020 CLINICAL DATA:  Renal insufficiency. EXAM: RENAL / URINARY TRACT ULTRASOUND COMPLETE COMPARISON:  None. FINDINGS: Right Kidney: Renal measurements: 10.9 x 4.4 x 3.7 cm = volume: 93 mL. Moderate increased cortical echogenicity. No mass or hydronephrosis visualized. Left Kidney: Renal measurements: 10.3 x 5.8 x 3.3 cm = volume: 101 mL. Mild increased cortical echogenicity. No mass or hydronephrosis visualized. Bladder: Appears normal for degree of bladder distention. Other: None. IMPRESSION: Normal size kidneys with increased cortical echogenicity which can be seen in medical renal disease. No hydronephrosis. Electronically Signed   By: Marin Olp M.D.   On: 05/10/2020 08:22   DG Chest Port 1 View  Result Date: 05/09/2020 CLINICAL DATA:  Generalized weakness and fatigue for 2 weeks, hypertension EXAM: PORTABLE CHEST 1 VIEW COMPARISON:  Portable exam 1247 hours without priors for comparison FINDINGS: Enlargement of cardiac silhouette. Mediastinal contours and pulmonary vascularity normal. Minimal RIGHT basilar atelectasis. Remaining lungs clear. No infiltrate, pleural effusion or pneumothorax. Osseous structures unremarkable. IMPRESSION: Enlargement of cardiac silhouette with minimal RIGHT basilar atelectasis. Electronically Signed   By: Lavonia Dana M.D.   On: 05/09/2020 13:18   ECHOCARDIOGRAM COMPLETE  Result Date: 05/10/2020    ECHOCARDIOGRAM REPORT   Patient Name:   WILFREDO CANTERBURY Date of Exam:  05/10/2020 Medical Rec #:  [id number]       Height:       73.0 in Accession #:    [id number]      Weight:       182.8 lb Date of Birth:  03-May-1971      BSA:          2.071 m Patient Age:    82 years        BP:           186/128 mmHg Patient Gender: M               HR:           97 bpm. Exam Location:  Inpatient Procedure: 2D Echo, Cardiac Doppler and Color Doppler Indications:    R94.31 Abnormal EKG; Elevated Troponin.  History:        Patient has no prior history of Echocardiogram examinations.                 Risk Factors:Hypertension.  Sonographer:    Jonelle Sidle Dance Referring Phys: [place]  1. Left ventricular ejection fraction, by estimation, is 25 to 30%. The left ventricle has severely decreased function. The left ventricle demonstrates global hypokinesis. There is moderate concentric left ventricular hypertrophy. Left ventricular diastolic parameters are consistent with Grade III diastolic dysfunction (restrictive). Elevated left ventricular end-diastolic pressure.  2. Right ventricular systolic function is mildly reduced. The right ventricular size is normal. There is moderately elevated pulmonary artery systolic pressure.  3. Left atrial size was moderately dilated.  4. Right atrial size was mildly dilated.  5. The mitral valve is normal in structure. Mild to moderate mitral valve regurgitation.  6. Tricuspid valve regurgitation is moderate.  7. The aortic valve is grossly normal. Aortic valve regurgitation is not visualized. No aortic stenosis is  present.  8. Aortic dilatation noted. There is mild to moderate dilatation of the ascending aorta measuring 43 mm.  9. The inferior vena cava is dilated in size with <50% respiratory variability, suggesting right atrial pressure of 15 mmHg. Comparison(s): No prior Echocardiogram. Conclusion(s)/Recommendation(s): Severely reduced LVEF with global hypokinesis. While LV measurements are within normal limits, appearance of mitral valve suggests  mitral annular dilation secondary to LV dilation. Elevated LVEDP and elevated right sided pressures, with moderate bordering on severe pulmonary hypertension. FINDINGS  Left Ventricle: Left ventricular ejection fraction, by estimation, is 25 to 30%. The left ventricle has severely decreased function. The left ventricle demonstrates global hypokinesis. The left ventricular internal cavity size was normal in size. There is moderate concentric left ventricular hypertrophy. Left ventricular diastolic parameters are consistent with Grade III diastolic dysfunction (restrictive). Elevated left ventricular end-diastolic pressure. Right Ventricle: The right ventricular size is normal. Right vetricular wall thickness was not assessed. Right ventricular systolic function is mildly reduced. There is moderately elevated pulmonary artery systolic pressure. The tricuspid regurgitant velocity is 3.26 m/s, and with an assumed right atrial pressure of 15 mmHg, the estimated right ventricular systolic pressure is 26.3 mmHg. Left Atrium: Left atrial size was moderately dilated. Right Atrium: Right atrial size was mildly dilated. Pericardium: A small pericardial effusion is present. The pericardial effusion is circumferential. Mitral Valve: The mitral valve is normal in structure. Mild to moderate mitral valve regurgitation. Tricuspid Valve: The tricuspid valve is normal in structure. Tricuspid valve regurgitation is moderate. Aortic Valve: The aortic valve is grossly normal. Aortic valve regurgitation is not visualized. No aortic stenosis is present. Pulmonic Valve: The pulmonic valve was not well visualized. Pulmonic valve regurgitation is mild to moderate. Aorta: Aortic dilatation noted. There is mild to moderate dilatation of the ascending aorta measuring 43 mm. Venous: The inferior vena cava is dilated in size with less than 50% respiratory variability, suggesting right atrial pressure of 15 mmHg. IAS/Shunts: The atrial septum is  grossly normal.  LEFT VENTRICLE PLAX 2D LVIDd:         4.90 cm  Diastology LVIDs:         4.20 cm  LV e' lateral:   5.15 cm/s LV PW:         1.60 cm  LV E/e' lateral: 20.7 LV IVS:        1.20 cm  LV e' medial:    4.19 cm/s LVOT diam:     2.30 cm  LV E/e' medial:  25.4 LV SV:         52 LV SV Index:   25 LVOT Area:     4.15 cm  RIGHT VENTRICLE            IVC RV Basal diam:  3.40 cm    IVC diam: 2.60 cm RV Mid diam:    2.00 cm RV S prime:     8.18 cm/s TAPSE (M-mode): 1.5 cm LEFT ATRIUM             Index       RIGHT ATRIUM           Index LA diam:        5.00 cm 2.41 cm/m  RA Area:     18.10 cm LA Vol (A2C):   85.9 ml 41.48 ml/m RA Volume:   56.50 ml  27.28 ml/m LA Vol (A4C):   88.0 ml 42.49 ml/m LA Biplane Vol: 87.2 ml 42.11 ml/m  AORTIC VALVE LVOT Vmax:  74.60 cm/s LVOT Vmean:  54.600 cm/s LVOT VTI:    0.126 m  AORTA Ao Root diam: 4.30 cm Ao Asc diam:  4.25 cm MITRAL VALVE                TRICUSPID VALVE MV Area (PHT): 4.04 cm     TR Peak grad:   42.5 mmHg MV Decel Time: 188 msec     TR Vmax:        326.00 cm/s MV E velocity: 106.50 cm/s MV A velocity: 44.00 cm/s   SHUNTS MV E/A ratio:  2.42         Systemic VTI:  0.13 m                             Systemic Diam: 2.30 cm Buford Dresser MD Electronically signed by Buford Dresser MD Signature Date/Time: 05/10/2020/10:40:47 AM    Final    US RENAL  Final Result    DG Chest Port 1 View  Final Result       Scheduled Meds: . amLODipine  5 mg Oral Daily  . heparin  5,000 Units Subcutaneous Q8H  . hydrALAZINE  50 mg Oral TID  . isosorbide dinitrate  20 mg Oral TID   PRN Meds: acetaminophen **OR** acetaminophen, labetalol, ondansetron **OR** ondansetron (ZOFRAN) IV Continuous Infusions:    LOS: 1 day  Time spent: Greater than 50% of the 35 minute visit was spent in counseling/coordination of care for the patient as laid out in the A&P.   Dwyane Dee, MD [facility] 05/10/2020, 4:26 PM   Contact via secure chat.  To  contact the attending provider between 7A-7P or the covering provider during after hours 7P-7A, please log into the web site [email or web address] and access using universal [place] password for that web site. If you do not have the password, please call the hospital operator.

## 2020-05-10 NOTE — Assessment & Plan Note (Addendum)
-   elevated troponin and creatinine on admission consistent with end organ failure/damage; still unclear baseline renal function - appreciate cardiology involvement in lower of BP; continue current regimen with adjustment as needed; patient started on amlodipine, hydralazine, isordil per cardiology; adjustment and timing of BP lowering deferred to cardiology  - meds further uptitrated on 7/26 per cardiology; BP remains well above goal

## 2020-05-10 NOTE — Assessment & Plan Note (Addendum)
-   more than likely is ACD; however may also be mixed - check iron studies and B12, folate  - iron studies low: start on oral iron - B12 low normal, start on oral supplement

## 2020-05-10 NOTE — Consult Note (Addendum)
Cardiology Consultation:   Patient ID: Bobby Stevens; [id number]; 08/26/1971   Admit date: 05/09/2020 Date of Consult: 05/10/2020  Primary Care Provider: System, Pcp Not In [facility], [place], [place],  [phone] Primary Cardiologist: Buford Dresser, MD New Primary Electrophysiologist:  None   Patient Profile:   Bobby Stevens is a 49 y.o. male with a hx of HTN, medication noncompliance, who is being seen today for the evaluation of hypertensive cardiomyopathy at the request of Dr. Sabino Gasser.  History of Present Illness:   Bobby Stevens was admitted 7/24 with hypertensive emergency, initial blood pressure of 240/185, SBP as high as 260.  BUN 57, creatinine 4.55, potassium 2.9.  He had cardiac enlargement on the chest x-ray and elevated troponin as well as abnormal EKG, cardiology asked to evaluate him.  Bobby Stevens has never had chest pain.  He denies lower extremity edema or orthopnea, but says he does regularly wake in the night and feels that he is a little short of breath at the time.  He is completely unaware of any problem with his kidneys. He cannot remember when he last had labs done, thinks it might have been 6 months ago.   He has a blood pressure cuff at home, but does not use it.  He has not taken his blood pressure in a long time.  He says that 6 months ago, he was seen at [facility] on [address]  He was on medications then, not sure exactly which ones.  He says his blood pressure was well controlled with a systolic in the 026V.  For about the last 2 weeks, he has been having problems with dyspnea on exertion and fatigue.  He says that he will start to do something, and get very tired and short of breath and have to sit down.  He has not been short of breath at rest.  He feels better since being in the hospital.   Past Medical History:  Diagnosis Date  . Hypertension 2020    Past Surgical History:  Procedure  Laterality Date  . None       Prior to Admission medications   None now  Per the [facility] [address], he got metoprolol ER 50 mg and lisinopril/HCTZ 20/25 mg filled in 2019     Inpatient Medications: Scheduled Meds: . amLODipine  5 mg Oral Daily  . heparin  5,000 Units Subcutaneous Q8H  . potassium chloride  40 mEq Oral Q4H   Continuous Infusions:  PRN Meds: acetaminophen **OR** acetaminophen, labetalol, ondansetron **OR** ondansetron (ZOFRAN) IV  Allergies:    Allergies  Allergen Reactions  . Penicillins     Social History:   Social History   Socioeconomic History  . Marital status: Single    Spouse name: Not on file  . Number of children: Not on file  . Years of education: Not on file  . Highest education level: Not on file  Occupational History  . Occupation: Automotive engineer: [employer]  Tobacco Use  . Smoking status: Never Smoker  . Smokeless tobacco: Never Used  Substance and Sexual Activity  . Alcohol use: Not Currently  . Drug use: Never  . Sexual activity: Not on file  Other Topics Concern  . Not on file  Social History Narrative   Pt lives w/ mother   Social Determinants of Health   Financial Resource Strain:   . Difficulty of Paying Living Expenses:  Food Insecurity:   . Worried About Charity fundraiser in the Last Year:   . Arboriculturist in the Last Year:   Transportation Needs:   . Film/video editor (Medical):   Marland Kitchen Lack of Transportation (Non-Medical):   Physical Activity:   . Days of Exercise per Week:   . Minutes of Exercise per Session:   Stress:   . Feeling of Stress :   Social Connections:   . Frequency of Communication with Friends and Family:   . Frequency of Social Gatherings with Friends and Family:   . Attends Religious Services:   . Active Member of Clubs or Organizations:   . Attends Archivist Meetings:   Marland Kitchen Marital Status:   Intimate Partner Violence:   . Fear of  Current or Ex-Partner:   . Emotionally Abused:   Marland Kitchen Physically Abused:   . Sexually Abused:     Family History:   Family History  Problem Relation Age of Onset  . Hypertension Mother   . Hypertension Father        died age 73  . Hypertension Brother   . Hypertension Maternal Grandmother    Family Status:  Family Status  Relation Name Status  . Mother  Alive  . Father  Deceased  . Brother  Alive  . MGM  Alive    ROS:  Please see the history of present illness.  All other ROS reviewed and negative.     Physical Exam/Data:   Vitals:   05/09/20 2336 05/09/20 2337 05/10/20 0233 05/10/20 0925  BP: (!) 184/136  (!) 186/128 (!) 194/159  Pulse:  88 97 102  Resp:  18 18 18   Temp:  98 F (36.7 C) 97.7 F (36.5 C) 98.1 F (36.7 C)  TempSrc:  Oral Oral Oral  SpO2:  98% 98% 99%  Weight:   82.9 kg   Height:        Intake/Output Summary (Last 24 hours) at 05/10/2020 1036 Last data filed at 05/10/2020 0236 Gross per 24 hour  Intake 240 ml  Output 1100 ml  Net -860 ml    Last 3 Weights 05/10/2020 05/09/2020 05/09/2020  Weight (lbs) 182 lb 12.8 oz 184 lb 12.8 oz 187 lb 3.2 oz  Weight (kg) 82.918 kg 83.825 kg 84.913 kg     Body mass index is 24.12 kg/m.   General:  Well nourished, well developed, male in no acute distress HEENT: normal Lymph: no adenopathy Neck: JVD - mildly elevated Endocrine:  No thryomegaly Vascular: No carotid bruits; 4/4 extremity pulses 2+  Cardiac:  normal S1, S2; RRR; soft murmur Lungs:  clear bilaterally, no wheezing, rhonchi or rales  Abd: soft, nontender, no hepatomegaly  Ext: no edema Musculoskeletal:  No deformities, BUE and BLE strength normal and equal Skin: warm and dry  Neuro:  CNs 2-12 intact, no focal abnormalities noted Psych:  Normal affect   EKG:  The EKG was personally reviewed and demonstrates: Sinus tachycardia, heart rate 115, LVH, no acute ischemic changes Telemetry:  Telemetry was personally reviewed and demonstrates: Sinus  rhythm, sinus tach   CV studies:   ECHO: Done, results pending  Laboratory Data:   Chemistry Recent Labs  Lab 05/09/20 1240 05/10/20 0507  NA 134* 136  K 2.9* 2.8*  CL 98 102  CO2 22 25  GLUCOSE 133* 107*  BUN 57* 53*  CREATININE 4.55* 4.61*  CALCIUM 9.0 8.8*  GFRNONAA 14* 14*  GFRAA 16* 16*  ANIONGAP  14 9    Lab Results  Component Value Date   ALT 26 2020-05-14   AST 31 May 14, 2020   ALKPHOS 59 2020-05-14   BILITOT 1.3 (H) 2020-05-14   Hematology Recent Labs  Lab 05/09/20 1240 05-14-20 0507  WBC 15.0* 12.2*  RBC 5.15 4.50  HGB 13.6 12.0*  HCT 41.3 36.6*  MCV 80.2 81.3  MCH 26.4 26.7  MCHC 32.9 32.8  RDW 13.3 13.6  PLT 272 228   Cardiac Enzymes High Sensitivity Troponin:   Recent Labs  Lab 05/09/20 1240 05/09/20 1438 May 14, 2020 0807  TROPONINIHS 105* 115* 79*      BNP Recent Labs  Lab 05/09/20 1240 05/14/2020 0807  BNP 2,048.3* 1,827.0*    DDimer  Recent Labs  Lab 05/09/20 1240  DDIMER 0.87*   TSH: No results found for: TSH Lipids:No results found for: CHOL, HDL, LDLCALC, LDLDIRECT, TRIG, CHOLHDL HgbA1c:No results found for: HGBA1C Magnesium:  Magnesium  Date Value Ref Range Status  2020-05-14 2.1 1.7 - 2.4 mg/dL Final    Comment:    Performed at [facility], [place] [address], [place], [place] [address]     Radiology/Studies:  US RENAL  Result Date: 05/14/20 CLINICAL DATA:  Renal insufficiency. EXAM: RENAL / URINARY TRACT ULTRASOUND COMPLETE COMPARISON:  None. FINDINGS: Right Kidney: Renal measurements: 10.9 x 4.4 x 3.7 cm = volume: 93 mL. Moderate increased cortical echogenicity. No mass or hydronephrosis visualized. Left Kidney: Renal measurements: 10.3 x 5.8 x 3.3 cm = volume: 101 mL. Mild increased cortical echogenicity. No mass or hydronephrosis visualized. Bladder: Appears normal for degree of bladder distention. Other: None. IMPRESSION: Normal size kidneys with increased cortical echogenicity which can be seen in medical  renal disease. No hydronephrosis. Electronically Signed   By: Marin Olp M.D.   On: 14-May-2020 08:22   DG Chest Port 1 View  Result Date: 05/09/2020 CLINICAL DATA:  Generalized weakness and fatigue for 2 weeks, hypertension EXAM: PORTABLE CHEST 1 VIEW COMPARISON:  Portable exam 1247 hours without priors for comparison FINDINGS: Enlargement of cardiac silhouette. Mediastinal contours and pulmonary vascularity normal. Minimal RIGHT basilar atelectasis. Remaining lungs clear. No infiltrate, pleural effusion or pneumothorax. Osseous structures unremarkable. IMPRESSION: Enlargement of cardiac silhouette with minimal RIGHT basilar atelectasis. Electronically Signed   By: Lavonia Dana M.D.   On: 05/09/2020 13:18    Assessment and Plan:   1.  Hypertensive cardiomyopathy: -We need the echo results before we can determine the final treatment plan. -He only mild volume overload on exam despite a very elevated BNP. -He is not having any ischemic symptoms - diuretic use limited by his poor renal function  2.  Hypertension: -The last blood pressure medications he had filled were in 2019. -He does not check his blood pressure at home, does not know how long it has been as high as it is now -Work with IM to get him on a medication regimen  3.  Acute versus chronic renal failure -He still has good urine output -He has not had any recent labs -Nephrology consult pending  Otherwise, per IM Principal Problem:   Hypertensive emergency Active Problems:   Renal insufficiency   Hypertensive nephropathy   Hypertensive cardiomyopathy ([place])     For questions or updates, please contact [place] Please consult [email or web address] for contact info under Cardiology/STEMI.   SignedRosaria Ferries, PA-C  2020/05/14 10:36 AM

## 2020-05-10 NOTE — Progress Notes (Signed)
  Echocardiogram 2D Echocardiogram has been performed.  Bobby Stevens 05/10/2020, 8:51 AM

## 2020-05-10 NOTE — Plan of Care (Signed)
  Problem: Education: Goal: Knowledge of General Education information will improve Description: Including pain rating scale, medication(s)/side effects and non-pharmacologic comfort measures Outcome: Progressing   Problem: Health Behavior/Discharge Planning: Goal: Ability to manage health-related needs will improve Outcome: Progressing   Problem: Clinical Measurements: Goal: Ability to maintain clinical measurements within normal limits will improve Outcome: Progressing Goal: Diagnostic test results will improve Outcome: Progressing Goal: Cardiovascular complication will be avoided Outcome: Progressing   Problem: Elimination: Goal: Will not experience complications related to bowel motility Outcome: Progressing Goal: Will not experience complications related to urinary retention Outcome: Progressing

## 2020-05-10 NOTE — Assessment & Plan Note (Signed)
-   LVH noted on EKG and echo; EF 25-30% with global hypokinesis of the left ventricle with LVH.  Grade 3 diastolic dysfunction.  Right ventricular systolic function was also reduced.  Biatrial enlargement. - not a cath candidate due to renal function - cardiology following and for now will be medical management until if/when renal function allows further ischemic and/or pulm HTN workup.

## 2020-05-10 NOTE — Assessment & Plan Note (Addendum)
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

## 2020-05-10 NOTE — Assessment & Plan Note (Signed)
-   see AKI

## 2020-05-10 NOTE — Hospital Course (Signed)
Mr. Limb is a 49 yo AA male with PMH HTN (severely uncontrolled), medication noncompliance 2/2 financial constraints who presented to the ER with complaints of feeling weak and lightheaded.  He was also short of breath with minimal exertion and also describing symptoms of orthopnea. His blood pressure was excessively elevated on admission, 246/181 initially.  He sustained diastolic blood pressures in the 150s to 180s for several hours. His mother the following morning from admission also stated that his blood pressures have been running that high at home as well.  They knew it was high but due to him being relatively asymptomatic initially over the past several months, did not put much thought into it. On lab work-up he was notable to have elevated BUN and creatinine, 57 and 4.55 respectively. Troponins were also checked and trended.  105, 115, 79. BNP was also elevated.  2048, 1827. EKG initially in the ER revealed sinus tachycardia with significant LVH. An echo was also obtained which showed reduced EF, 25 to 30% and global hypokinesis of the left ventricle with LVH.  Grade 3 diastolic dysfunction.  Right ventricular systolic function was also reduced.  Biatrial enlargement.  Cardiology and nephrology were consulted for further input and evaluation.  Goal was for gradual BP reduction. He was started on amlodipine, hydralazine, and isordil. He also underwent further workup from nephrology regarding other causes of his renal failure aside from uncontrolled HTN.

## 2020-05-10 NOTE — Consult Note (Signed)
Bobby Stevens Admit Date: 05/09/2020 05/10/2020 Bobby Stevens Requesting Physician:  Dwyane Dee, MD  Reason for Consult:  Bobby Stevens HPI:  49 year old African-American male with past medical history of hypertension (diagnosed in 2000) who has been off of blood pressure pills for quite some time due to cost who presents to the ER due to feeling generally weak and lightheaded.  He also reported dyspnea on exertion as well as orthopnea prior to coming in.  Patient reports that his blood pressure used to be controlled with medications (was taking lisinopril and hydrochlorothiazide).  He reports that his blood pressure was typically in the systolic 151V.  He also has been utilizing ibuprofen every other day since April because of headaches and dizziness.  He wants to follow-up with a primary care physician at [facility]  He was never told that he has kidney disease prior.  He has a very strong family history of hypertension but no kidney disease. Interestingly he used to take lisinopril and hydrochlorothiazide and was told that he needs a lot of potassium supplementation in the past (as per patient). Currently feels better.  Denies any chest pain, shortness of breath, dizziness, frothiness of his urine, hematuria, swelling, rash, epistaxis, hemoptysis, change in his vision, neck pain, arthralgias.  PMH Incudes: Past Medical History:  Diagnosis Date  . Hypertension 2020       Creatinine, Ser (mg/dL)  Date Value  05/10/2020 4.61 (H)  05/09/2020 4.55 (H)  ] I/Os:  ROS Balance of 12 systems is negative w/ exceptions as above  PMH  Past Medical History:  Diagnosis Date  . Hypertension 2020   [place]  Past Surgical History:  Procedure Laterality Date  . None     FH  Family History  Problem Relation Age of Onset  . Hypertension Mother   . Hypertension Father        died age 20  . Hypertension Brother   . Hypertension Maternal Grandmother    SH  reports that he has never  smoked. He has never used smokeless tobacco. He reports previous alcohol use. He reports that he does not use drugs. Allergies  Allergies  Allergen Reactions  . Penicillins Rash   Home medications Prior to Admission medications   Medication Sig Start Date End Date Taking? Authorizing Provider  hydrochlorothiazide (HYDRODIURIL) 25 MG tablet Take 25 mg by mouth daily.    Yes [provider]  lisinopril (ZESTRIL) 20 MG tablet Take 20 mg by mouth daily.    Yes [provider]    Current Medications Scheduled Meds: . amLODipine  5 mg Oral Daily  . heparin  5,000 Units Subcutaneous Q8H  . hydrALAZINE  50 mg Oral TID  . isosorbide dinitrate  20 mg Oral TID  . potassium chloride  40 mEq Oral Q4H   Continuous Infusions: PRN Meds:.acetaminophen **OR** acetaminophen, labetalol, ondansetron **OR** ondansetron (ZOFRAN) IV  CBC Recent Labs  Lab 05/09/20 1240 05/10/20 0507  WBC 15.0* 12.2*  HGB 13.6 12.0*  HCT 41.3 36.6*  MCV 80.2 81.3  PLT 272 616   Basic Metabolic Panel Recent Labs  Lab 05/09/20 1240 05/10/20 0507  NA 134* 136  K 2.9* 2.8*  CL 98 102  CO2 22 25  GLUCOSE 133* 107*  BUN 57* 53*  CREATININE 4.55* 4.61*  CALCIUM 9.0 8.8*    Physical Exam  Blood pressure (!) 170/119, pulse 100, temperature 98.2 F (36.8 C), temperature source Oral, resp. rate 18, height 6\' 1"  (1.854 m), weight 82.9 kg,  SpO2 98 %. GEN: wdwn, sitting in bed, nad ENT: no nasal discharge, mmm EYES: no scleral icterus, eomi CV: normal rate, no murmurs PULM: no iwob, bilateral chest rise ABD: NABS, non-distended SKIN: no rashes or jaundice EXT: no edema, warm and well perfused  Renal u/s 05/10/2020 Normal size kidneys with increased cortical echogenicity which can be seen in medical renal disease. No hydronephrosis.  Assessment 1. Rise in Cr, suspecting more of an AKI. Given nephrotic range proteinuria and longstanding history of hypertension (albeit controlled) there may be  a component of collapsing variant of FSGS? May have NSAID induced AKI as well 2. HTN urgency 3. Nephrotic range proteinuria 4. Hypokalemia 5. Hypertensive cardiomyopathy   Plan 1. Ideally, would like to biopsy him however his blood pressure is a limiting factor.  We will plan for this once he has a more acceptable blood pressure.  Typically I would like to aim for systolic less than 845 before moving forward.  We will hold off on consulting IR for now.  Once his blood pressures are better we will consult them and get their input. 2. Will need his outpatient records to determine what his baseline renal function actually is 3. Given that he is on chronic potassium supplementation despite taking an ACE inhibitor and he is profoundly hypokalemic, I wonder if he has a component of Liddle's syndrome (strong family history of hypertension as well).  Consider adding amiloride as an antihypertensive-appreciate assistance from cardiology 4. Hepatitis panel, HIV, SPEP with IFE, free light chains, RPR to start the work up for nephrotic range proteinuria. Will hold on sending out apol1 especially if going down the route of a potential biopsy. 5. ANA, anti dsDNA, and ANCA serologies (hematuria on UA) 6. Will check CK 7. If hgb downtrends, check hemolysis labs (ldh and hapto) 8. Avoid nephrotoxic medications including NSAIDs and iodinated intravenous contrast exposure unless the latter is absolutely indicated.  Preferred narcotic agents for pain control are hydromorphone, fentanyl, and methadone. Morphine should not be used. Avoid Baclofen and avoid oral sodium phosphate and magnesium citrate based laxatives / bowel preps. Continue strict Input and Output monitoring. Will monitor the patient closely with you and intervene or adjust therapy as indicated by changes in clinical status/labs   Plans communicated to the primary team   Bobby Quint, MD [place] 05/10/2020, 3:35 PM

## 2020-05-11 DIAGNOSIS — I429 Cardiomyopathy, unspecified: Secondary | ICD-10-CM

## 2020-05-11 DIAGNOSIS — I1311 Hypertensive heart and chronic kidney disease without heart failure, with stage 5 chronic kidney disease, or end stage renal disease: Secondary | ICD-10-CM

## 2020-05-11 DIAGNOSIS — I10 Essential (primary) hypertension: Secondary | ICD-10-CM

## 2020-05-11 LAB — COMPREHENSIVE METABOLIC PANEL
ALT: 23 U/L (ref 0–44)
AST: 17 U/L (ref 15–41)
Albumin: 3.4 g/dL — ABNORMAL LOW (ref 3.5–5.0)
Alkaline Phosphatase: 64 U/L (ref 38–126)
Anion gap: 13 (ref 5–15)
BUN: 56 mg/dL — ABNORMAL HIGH (ref 6–20)
CO2: 20 mmol/L — ABNORMAL LOW (ref 22–32)
Calcium: 8.9 mg/dL (ref 8.9–10.3)
Chloride: 105 mmol/L (ref 98–111)
Creatinine, Ser: 4.8 mg/dL — ABNORMAL HIGH (ref 0.61–1.24)
GFR calc Af Amer: 15 mL/min — ABNORMAL LOW (ref >60)
GFR calc non Af Amer: 13 mL/min — ABNORMAL LOW (ref >60)
Glucose, Bld: 107 mg/dL — ABNORMAL HIGH (ref 70–99)
Potassium: 3.7 mmol/L (ref 3.5–5.1)
Sodium: 138 mmol/L (ref 135–145)
Total Bilirubin: 1.2 mg/dL (ref 0.3–1.2)
Total Protein: 6.4 g/dL — ABNORMAL LOW (ref 6.5–8.1)

## 2020-05-11 LAB — CBC WITH DIFFERENTIAL/PLATELET
Abs Immature Granulocytes: 0.07 10*3/uL (ref 0.00–0.07)
Basophils Absolute: 0 10*3/uL (ref 0.0–0.1)
Basophils Relative: 0 %
Eosinophils Absolute: 0.1 10*3/uL (ref 0.0–0.5)
Eosinophils Relative: 1 %
HCT: 37.7 % — ABNORMAL LOW (ref 39.0–52.0)
Hemoglobin: 12 g/dL — ABNORMAL LOW (ref 13.0–17.0)
Immature Granulocytes: 1 %
Lymphocytes Relative: 10 %
Lymphs Abs: 1.5 10*3/uL (ref 0.7–4.0)
MCH: 26 pg (ref 26.0–34.0)
MCHC: 31.8 g/dL (ref 30.0–36.0)
MCV: 81.8 fL (ref 80.0–100.0)
Monocytes Absolute: 0.8 10*3/uL (ref 0.1–1.0)
Monocytes Relative: 5 %
Neutro Abs: 12.4 10*3/uL — ABNORMAL HIGH (ref 1.7–7.7)
Neutrophils Relative %: 83 %
Platelets: 258 10*3/uL (ref 150–400)
RBC: 4.61 MIL/uL (ref 4.22–5.81)
RDW: 14.1 % (ref 11.5–15.5)
WBC: 14.9 10*3/uL — ABNORMAL HIGH (ref 4.0–10.5)
nRBC: 0 % (ref 0.0–0.2)

## 2020-05-11 LAB — MICROALBUMIN / CREATININE URINE RATIO
Creatinine, Urine: 87.1 mg/dL
Microalb Creat Ratio: 2759 mg/g creat — ABNORMAL HIGH (ref 0–29)
Microalb, Ur: 2403.5 ug/mL — ABNORMAL HIGH

## 2020-05-11 LAB — RPR: RPR Ser Ql: NONREACTIVE

## 2020-05-11 LAB — KAPPA/LAMBDA LIGHT CHAINS
Kappa free light chain: 64.8 mg/L — ABNORMAL HIGH (ref 3.3–19.4)
Kappa, lambda light chain ratio: 2.1 — ABNORMAL HIGH (ref 0.26–1.65)
Lambda free light chains: 30.8 mg/L — ABNORMAL HIGH (ref 5.7–26.3)

## 2020-05-11 LAB — MAGNESIUM: Magnesium: 2.3 mg/dL (ref 1.7–2.4)

## 2020-05-11 LAB — PHOSPHORUS: Phosphorus: 3.8 mg/dL (ref 2.5–4.6)

## 2020-05-11 LAB — ANA W/REFLEX IF POSITIVE: Anti Nuclear Antibody (ANA): NEGATIVE

## 2020-05-11 LAB — TROPONIN I (HIGH SENSITIVITY): Troponin I (High Sensitivity): 67 ng/L — ABNORMAL HIGH (ref <18)

## 2020-05-11 LAB — HEPATITIS B SURFACE ANTIBODY, QUANTITATIVE: Hep B S AB Quant (Post): <3.1 m[IU]/mL — ABNORMAL LOW (ref >9.9)

## 2020-05-11 LAB — ANTI-DNA ANTIBODY, DOUBLE-STRANDED: ds DNA Ab: <1 IU/mL (ref 0–9)

## 2020-05-11 MED ORDER — HYDRALAZINE HCL 50 MG PO TABS
100.0000 mg | ORAL_TABLET | Freq: Three times a day (TID) | ORAL | Status: DC
  Administered 2020-05-11 – 2020-05-15 (×12): 100 mg via ORAL
  Filled 2020-05-11 (×12): qty 2

## 2020-05-11 MED ORDER — ISOSORBIDE DINITRATE 10 MG PO TABS
40.0000 mg | ORAL_TABLET | Freq: Three times a day (TID) | ORAL | Status: DC
  Administered 2020-05-11 – 2020-05-15 (×12): 40 mg via ORAL
  Filled 2020-05-11 (×12): qty 4

## 2020-05-11 MED ORDER — CARVEDILOL 6.25 MG PO TABS
6.2500 mg | ORAL_TABLET | Freq: Two times a day (BID) | ORAL | Status: DC
  Administered 2020-05-12 (×2): 6.25 mg via ORAL
  Filled 2020-05-11 (×2): qty 1

## 2020-05-11 MED ORDER — AMLODIPINE BESYLATE 10 MG PO TABS
10.0000 mg | ORAL_TABLET | Freq: Every day | ORAL | Status: DC
  Administered 2020-05-11 – 2020-05-15 (×5): 10 mg via ORAL
  Filled 2020-05-11 (×5): qty 1

## 2020-05-11 MED ORDER — FERROUS SULFATE 325 (65 FE) MG PO TABS
325.0000 mg | ORAL_TABLET | Freq: Every day | ORAL | Status: DC
  Administered 2020-05-11 – 2020-05-15 (×5): 325 mg via ORAL
  Filled 2020-05-11 (×5): qty 1

## 2020-05-11 MED ORDER — VITAMIN B-12 1000 MCG PO TABS
1000.0000 ug | ORAL_TABLET | Freq: Every day | ORAL | Status: DC
  Administered 2020-05-11 – 2020-05-15 (×5): 1000 ug via ORAL
  Filled 2020-05-11 (×5): qty 1

## 2020-05-11 NOTE — Progress Notes (Signed)
Pharmacist Heart Failure Core Measure Documentation  Assessment: Bobby Stevens has an EF documented as 25-30% on 05/10/20 by Echo.  Rationale: Heart failure patients with left ventricular systolic dysfunction (LVSD) and an EF < 40% should be prescribed an angiotensin converting enzyme inhibitor (ACEI) or angiotensin receptor blocker (ARB) at discharge unless a contraindication is documented in the medical record.  This patient is not currently on an ACEI or ARB for HF.  This note is being placed in the record in order to provide documentation that a contraindication to the use of these agents is present for this encounter.  ACE Inhibitor or Angiotensin Receptor Blocker is contraindicated (specify all that apply)  []   ACEI allergy AND ARB allergy []   Angioedema []   Moderate or severe aortic stenosis []   Hyperkalemia []   Hypotension []   Renal artery stenosis [x]   Worsening renal function, preexisting renal disease or dysfunction  Hildred Laser, PharmD Clinical Pharmacist **Pharmacist phone directory can now be found on [email or web address] ([email or web address]).  Listed under [place].

## 2020-05-11 NOTE — Plan of Care (Signed)
  Problem: Education: Goal: Knowledge of General Education information will improve Description: Including pain rating scale, medication(s)/side effects and non-pharmacologic comfort measures Outcome: Progressing   Problem: Health Behavior/Discharge Planning: Goal: Ability to manage health-related needs will improve Outcome: Progressing   Problem: Clinical Measurements: Goal: Ability to maintain clinical measurements within normal limits will improve Outcome: Progressing Goal: Diagnostic test results will improve Outcome: Progressing Goal: Cardiovascular complication will be avoided Outcome: Progressing   Problem: Elimination: Goal: Will not experience complications related to bowel motility Outcome: Progressing Goal: Will not experience complications related to urinary retention Outcome: Progressing

## 2020-05-11 NOTE — Assessment & Plan Note (Signed)
-   see hypertensive emergency - will need multiple agents prior to discharge; once stable on a regimen will need to discuss with patient if financial assistance needed

## 2020-05-11 NOTE — TOC Initial Note (Signed)
Transition of Care [facility]) - Initial/Assessment Note    Patient Details  Name: Bobby Stevens MRN: [id number] Date of Birth: 30-Mar-1971  Transition of [facility] CM/SW Contact:    Ninfa Meeker, RN Phone Number: [phone] (working remotely) 05/11/2020, 11:52 AM  Clinical Narrative: 49 yr old male admitted with Hypertensive emergency.  BP in 200's. Case manager spoke with patient concerning PCP and medications. He states he has not been able to afford the co-pay for Medical Care at [facility] $125.00 per visit and he hasn't taken blood pressure medications for 6 months or more. Patient is agreeable with CM getting him appointment with one of our sites. Hospital follow up appointment and to establish medical care is scheduled for Thursday, June 11, 2020,1:50pm  At the [facility]. Medications will need to be provided through [facility], MATCH Letter will be done. TOC Team will continue to monitor.              Expected Discharge Plan: Home/Self Care Barriers to Discharge: Continued Medical Work up   Patient Goals and CMS Choice        Expected Discharge Plan and Services Expected Discharge Plan: Home/Self Care In-house Referral: NA Discharge Planning Services: [facility], Follow-up appt scheduled   Living arrangements for the past 2 months: Single Family Home                 DME Arranged: N/A DME Agency: NA       HH Arranged: NA HH Agency: NA        Prior Living Arrangements/Services Living arrangements for the past 2 months: Single Family Home Lives with:: Parents Patient language and need for interpreter reviewed:: Yes Do you feel safe going back to the place where you live?: Yes      Need for Family Participation in Patient Care: No (Comment) Care giver support system in place?: Yes (comment)   Criminal Activity/Legal Involvement Pertinent to Current Situation/Hospitalization: No - Comment as needed  Activities of Daily  Living      Permission Sought/Granted Permission sought to share information with : Case Manager Permission granted to share information with : Yes, Verbal Permission Granted              Emotional Assessment   Attitude/Demeanor/Rapport: Gracious, Engaged   Orientation: : Oriented to Place, Oriented to Self, Oriented to  Time, Oriented to Situation Alcohol / Substance Use: Not Applicable Psych Involvement: No (comment)  Admission diagnosis:  Hypertensive emergency [I16.1] Patient Active Problem List   Diagnosis Date Noted  . Hypertensive emergency 05/09/2020  . AKI (acute kidney injury) ([place]) 05/09/2020  . Hypertensive nephropathy 05/09/2020  . Hypertensive cardiomyopathy ([place]) 05/09/2020  . Hypertension 2020  . Hypokalemia 2020  . Normocytic anemia 2020   PCP:  System, Pcp Not In Pharmacy:   [place] [place] [address] [place] [address] Phone: [phone] Fax: [phone]     Social Determinants of Health (SDOH) Interventions    Readmission Risk Interventions No flowsheet data found.

## 2020-05-11 NOTE — Progress Notes (Signed)
Progress Note  Patient Name: Bobby Stevens Date of Encounter: 05/11/2020  [facility] Cardiologist: Buford Dresser, MD   Subjective   No acute overnight events. He denies any chest pain. He does note difficulty sleeping overnight. He describes some mild orthopnea and PND. No palpitations.  Inpatient Medications    Scheduled Meds: . amLODipine  5 mg Oral Daily  . heparin  5,000 Units Subcutaneous Q8H  . hydrALAZINE  50 mg Oral TID  . isosorbide dinitrate  20 mg Oral TID   Continuous Infusions:  PRN Meds: acetaminophen **OR** acetaminophen, labetalol, ondansetron **OR** ondansetron (ZOFRAN) IV   Vital Signs    Vitals:   05/10/20 1806 05/10/20 1955 05/11/20 0025 05/11/20 0429  BP: (!) 171/127 (!) 192/140 (!) 167/111 (!) 200/148  Pulse: 104 100 101 100  Resp: 18 19 17 18   Temp: 98.2 F (36.8 C) 98.3 F (36.8 C) 98.1 F (36.7 C) 97.9 F (36.6 C)  TempSrc: Oral Oral Oral Oral  SpO2:  100% 98% 99%  Weight:    82.2 kg  Height:        Intake/Output Summary (Last 24 hours) at 05/11/2020 0736 Last data filed at 05/10/2020 2015 Gross per 24 hour  Intake 1065 ml  Output 500 ml  Net 565 ml   Last 3 Weights 05/11/2020 05/10/2020 05/09/2020  Weight (lbs) 181 lb 3.2 oz 182 lb 12.8 oz 184 lb 12.8 oz  Weight (kg) 82.192 kg 82.918 kg 83.825 kg      Telemetry    Sinus rhythm with rates in the 90's to low's 100's. - Personally Reviewed  ECG    No new ECG tracing today. - Personally Reviewed  Physical Exam   GEN: No acute distress.   Neck: Supple. Elevated JVD. Cardiac: RRR. No murmurs, rubs, or gallops. Radial and distal pedal pulses 2+ and equal bilaterally. Respiratory: Clear to auscultation bilaterally. No wheezes, rhonchi, or rales. GI: Soft, non-tender, non-distended  MS: No lower extremity edema. No deformity. Skin: Warm and dry. Neuro:  No focal deficits. Psych: Normal affect. Responds appropriately.  Labs    High Sensitivity Troponin:   Recent  Labs  Lab 05/09/20 1240 05/09/20 1438 05/10/20 0807  TROPONINIHS 105* 115* 79*      Chemistry Recent Labs  Lab 05/09/20 1240 05/10/20 0507 05/10/20 0807 05/11/20 0342  NA 134* 136  --  138  K 2.9* 2.8*  --  3.7  CL 98 102  --  105  CO2 22 25  --  20*  GLUCOSE 133* 107*  --  107*  BUN 57* 53*  --  56*  CREATININE 4.55* 4.61*  --  4.80*  CALCIUM 9.0 8.8*  --  8.9  PROT  --   --  6.2* 6.4*  ALBUMIN  --   --  3.1* 3.4*  AST  --   --  31 17  ALT  --   --  26 23  ALKPHOS  --   --  59 64  BILITOT  --   --  1.3* 1.2  GFRNONAA 14* 14*  --  13*  GFRAA 16* 16*  --  15*  ANIONGAP 14 9  --  13     Hematology Recent Labs  Lab 05/09/20 1240 05/10/20 0507 05/11/20 0342  WBC 15.0* 12.2* 14.9*  RBC 5.15 4.50 4.61  HGB 13.6 12.0* 12.0*  HCT 41.3 36.6* 37.7*  MCV 80.2 81.3 81.8  MCH 26.4 26.7 26.0  MCHC 32.9 32.8 31.8  RDW 13.3 13.6  14.1  PLT 272 228 258    BNP Recent Labs  Lab 05/09/20 1240 05/10/20 0807  BNP 2,048.3* 1,827.0*     DDimer  Recent Labs  Lab 05/09/20 1240  DDIMER 0.87*     Radiology    US RENAL  Result Date: 05/10/2020 CLINICAL DATA:  Renal insufficiency. EXAM: RENAL / URINARY TRACT ULTRASOUND COMPLETE COMPARISON:  None. FINDINGS: Right Kidney: Renal measurements: 10.9 x 4.4 x 3.7 cm = volume: 93 mL. Moderate increased cortical echogenicity. No mass or hydronephrosis visualized. Left Kidney: Renal measurements: 10.3 x 5.8 x 3.3 cm = volume: 101 mL. Mild increased cortical echogenicity. No mass or hydronephrosis visualized. Bladder: Appears normal for degree of bladder distention. Other: None. IMPRESSION: Normal size kidneys with increased cortical echogenicity which can be seen in medical renal disease. No hydronephrosis. Electronically Signed   By: Marin Olp M.D.   On: 05/10/2020 08:22   DG Chest Port 1 View  Result Date: 05/09/2020 CLINICAL DATA:  Generalized weakness and fatigue for 2 weeks, hypertension EXAM: PORTABLE CHEST 1 VIEW  COMPARISON:  Portable exam 1247 hours without priors for comparison FINDINGS: Enlargement of cardiac silhouette. Mediastinal contours and pulmonary vascularity normal. Minimal RIGHT basilar atelectasis. Remaining lungs clear. No infiltrate, pleural effusion or pneumothorax. Osseous structures unremarkable. IMPRESSION: Enlargement of cardiac silhouette with minimal RIGHT basilar atelectasis. Electronically Signed   By: Lavonia Dana M.D.   On: 05/09/2020 13:18   ECHOCARDIOGRAM COMPLETE  Result Date: 05/10/2020    ECHOCARDIOGRAM REPORT   Patient Name:   Bobby Stevens Date of Exam: 05/10/2020 Medical Rec #:  [id number]       Height:       73.0 in Accession #:    [id number]      Weight:       182.8 lb Date of Birth:  03-11-1971      BSA:          2.071 m Patient Age:    49 years        BP:           186/128 mmHg Patient Gender: M               HR:           97 bpm. Exam Location:  Inpatient Procedure: 2D Echo, Cardiac Doppler and Color Doppler Indications:    R94.31 Abnormal EKG; Elevated Troponin.  History:        Patient has no prior history of Echocardiogram examinations.                 Risk Factors:Hypertension.  Sonographer:    Jonelle Sidle Dance Referring Phys: [place]  1. Left ventricular ejection fraction, by estimation, is 25 to 30%. The left ventricle has severely decreased function. The left ventricle demonstrates global hypokinesis. There is moderate concentric left ventricular hypertrophy. Left ventricular diastolic parameters are consistent with Grade III diastolic dysfunction (restrictive). Elevated left ventricular end-diastolic pressure.  2. Right ventricular systolic function is mildly reduced. The right ventricular size is normal. There is moderately elevated pulmonary artery systolic pressure.  3. Left atrial size was moderately dilated.  4. Right atrial size was mildly dilated.  5. The mitral valve is normal in structure. Mild to moderate mitral valve regurgitation.  6.  Tricuspid valve regurgitation is moderate.  7. The aortic valve is grossly normal. Aortic valve regurgitation is not visualized. No aortic stenosis is present.  8. Aortic dilatation noted. There is mild to moderate dilatation of  the ascending aorta measuring 43 mm.  9. The inferior vena cava is dilated in size with <50% respiratory variability, suggesting right atrial pressure of 15 mmHg. Comparison(s): No prior Echocardiogram. Conclusion(s)/Recommendation(s): Severely reduced LVEF with global hypokinesis. While LV measurements are within normal limits, appearance of mitral valve suggests mitral annular dilation secondary to LV dilation. Elevated LVEDP and elevated right sided pressures, with moderate bordering on severe pulmonary hypertension. FINDINGS  Left Ventricle: Left ventricular ejection fraction, by estimation, is 25 to 30%. The left ventricle has severely decreased function. The left ventricle demonstrates global hypokinesis. The left ventricular internal cavity size was normal in size. There is moderate concentric left ventricular hypertrophy. Left ventricular diastolic parameters are consistent with Grade III diastolic dysfunction (restrictive). Elevated left ventricular end-diastolic pressure. Right Ventricle: The right ventricular size is normal. Right vetricular wall thickness was not assessed. Right ventricular systolic function is mildly reduced. There is moderately elevated pulmonary artery systolic pressure. The tricuspid regurgitant velocity is 3.26 m/s, and with an assumed right atrial pressure of 15 mmHg, the estimated right ventricular systolic pressure is 26.9 mmHg. Left Atrium: Left atrial size was moderately dilated. Right Atrium: Right atrial size was mildly dilated. Pericardium: A small pericardial effusion is present. The pericardial effusion is circumferential. Mitral Valve: The mitral valve is normal in structure. Mild to moderate mitral valve regurgitation. Tricuspid Valve: The  tricuspid valve is normal in structure. Tricuspid valve regurgitation is moderate. Aortic Valve: The aortic valve is grossly normal. Aortic valve regurgitation is not visualized. No aortic stenosis is present. Pulmonic Valve: The pulmonic valve was not well visualized. Pulmonic valve regurgitation is mild to moderate. Aorta: Aortic dilatation noted. There is mild to moderate dilatation of the ascending aorta measuring 43 mm. Venous: The inferior vena cava is dilated in size with less than 50% respiratory variability, suggesting right atrial pressure of 15 mmHg. IAS/Shunts: The atrial septum is grossly normal.  LEFT VENTRICLE PLAX 2D LVIDd:         4.90 cm  Diastology LVIDs:         4.20 cm  LV e' lateral:   5.15 cm/s LV PW:         1.60 cm  LV E/e' lateral: 20.7 LV IVS:        1.20 cm  LV e' medial:    4.19 cm/s LVOT diam:     2.30 cm  LV E/e' medial:  25.4 LV SV:         52 LV SV Index:   25 LVOT Area:     4.15 cm  RIGHT VENTRICLE            IVC RV Basal diam:  3.40 cm    IVC diam: 2.60 cm RV Mid diam:    2.00 cm RV S prime:     8.18 cm/s TAPSE (M-mode): 1.5 cm LEFT ATRIUM             Index       RIGHT ATRIUM           Index LA diam:        5.00 cm 2.41 cm/m  RA Area:     18.10 cm LA Vol (A2C):   85.9 ml 41.48 ml/m RA Volume:   56.50 ml  27.28 ml/m LA Vol (A4C):   88.0 ml 42.49 ml/m LA Biplane Vol: 87.2 ml 42.11 ml/m  AORTIC VALVE LVOT Vmax:   74.60 cm/s LVOT Vmean:  54.600 cm/s LVOT VTI:    0.126 m  AORTA Ao Root diam: 4.30 cm Ao Asc diam:  4.25 cm MITRAL VALVE                TRICUSPID VALVE MV Area (PHT): 4.04 cm     TR Peak grad:   42.5 mmHg MV Decel Time: 188 msec     TR Vmax:        326.00 cm/s MV E velocity: 106.50 cm/s MV A velocity: 44.00 cm/s   SHUNTS MV E/A ratio:  2.42         Systemic VTI:  0.13 m                             Systemic Diam: 2.30 cm Buford Dresser MD Electronically signed by Buford Dresser MD Signature Date/Time: 05/10/2020/10:40:47 AM    Final     Cardiac Studies     Echocardiogram 05/10/2020: Impressions: 1. Left ventricular ejection fraction, by estimation, is 25 to 30%. The  left ventricle has severely decreased function. The left ventricle  demonstrates global hypokinesis. There is moderate concentric left  ventricular hypertrophy. Left ventricular  diastolic parameters are consistent with Grade III diastolic dysfunction  (restrictive). Elevated left ventricular end-diastolic pressure.  2. Right ventricular systolic function is mildly reduced. The right  ventricular size is normal. There is moderately elevated pulmonary artery  systolic pressure.  3. Left atrial size was moderately dilated.  4. Right atrial size was mildly dilated.  5. The mitral valve is normal in structure. Mild to moderate mitral valve  regurgitation.  6. Tricuspid valve regurgitation is moderate.  7. The aortic valve is grossly normal. Aortic valve regurgitation is not  visualized. No aortic stenosis is present.  8. Aortic dilatation noted. There is mild to moderate dilatation of the  ascending aorta measuring 43 mm.  9. The inferior vena cava is dilated in size with <50% respiratory  variability, suggesting right atrial pressure of 15 mmHg.   Comparison(s): No prior Echocardiogram.   Conclusion(s)/Recommendation(s): Severely reduced LVEF with global  hypokinesis. While LV measurements are within normal limits, appearance of  mitral valve suggests mitral annular dilation secondary to LV dilation.  Elevated LVEDP and elevated right sided pressures, with moderate bordering on severe pulmonary hypertension.   Patient Profile     49 y.o. male with a history of hypertension and medication non-compliance who is being seen for evaluation of hypertensive emergency and hypertensive cardiomyopathy at the request of Dr. Sabino Gasser.  Assessment & Plan    Hypertensive Cardiomyopathy - Echo showed LVEF of 25-30% with global hypokinesis, grade 3 diastolic dysfunction, and  elevated LVEDP. Also showed mild to moderate MR, moderate TR, and mildly reduced RV systolic function with moderately elevated PASP.  - BNP elevated at 1,827.  - Patient describes some orthopnea PND but lungs clear but does not look volume overloaded.  - Started on Hydralazine 50mg  TID and Isordil 20mg  TID yesterday. - No ACEi/ARB/ARNI or Spironolactone due to renal function.  - Plan is to add Carvedilol once he tolerates afterload reduction. - Would defer any diuresis to Nephrology given renal function but I do not think he needs any at this time. - Continue to monitor volume status closely.  - Would ideally due to [facility] for work-up of new cardiomyopathy; however, cannot due this at this time due to renal function.  Hypertensive Emergency - Presented with markedly elevated BP as high as 240/185 and AKI with creatinine >4.0.  - Started on Amlodipine 5mg   daily as well as Hydralazine and Isordil as above. BP still very elevated and 200/148 and 182/136 this morning. - Will go ahead and increase Amlodipine to 10mg  daily. Can likely increase Hydralazine as well. - Nephrology recommended considering adding Amiloride.   Demand Ischemia - High-sensitivity troponin elevated but down-trending at 105 >> 115 >> 79 >> 67.  - EKG showed significant LVH and non-specific ST/T changes. - Echo as above.  - Patient denies any chest pain. - Unable to perform LHC due to renal function. However, suspect demand ischemia due to hypertensive emergency.   AKI - Creatinine 4.55 on admission and up to 4.80 today.  - Renal ultrasound showed normal size kidney with increased cortical echogenicity which can be seen in medical renal disease. No hydronephrosis. - Nephrology following and has ordered extensive work-up (hepatitis panel, HIV, SPEP with IFE, free light chains, RPR as well as ANA, anti-dsDNA, and ANCA serologies. Given significant hypokalemia, Liddle's syndrome is a possibility.  - Management per  Nephrology.  For questions or updates, please contact [place] Please consult [email or web address] for contact info under        Signed, Darreld Mclean, PA-C  05/11/2020, 7:36 AM

## 2020-05-11 NOTE — Progress Notes (Signed)
PROGRESS NOTE    Bobby Stevens   UKG:[id number]  DOB: [birth year]  DOA: 05/09/2020     2  PCP: System, Pcp Not In  CC: elevated BP at home, weakness, lightheadedness  Hospital Course: [name] is a 49 yo AA male with PMH HTN (severely uncontrolled), medication noncompliance 2/2 financial constraints who presented to the ER with complaints of feeling weak and lightheaded.  He was also short of breath with minimal exertion and also describing symptoms of orthopnea. His blood pressure was excessively elevated on admission, 246/181 initially.  He sustained diastolic blood pressures in the 150s to 180s for several hours. His mother the following morning from admission also stated that his blood pressures have been running that high at home as well.  They knew it was high but due to him being relatively asymptomatic initially over the past several months, did not put much thought into it. On lab work-up he was notable to have elevated BUN and creatinine, 57 and 4.55 respectively. Troponins were also checked and trended.  105, 115, 79. BNP was also elevated.  2048, 1827. EKG initially in the ER revealed sinus tachycardia with significant LVH. An echo was also obtained which showed reduced EF, 25 to 30% and global hypokinesis of the left ventricle with LVH.  Grade 3 diastolic dysfunction.  Right ventricular systolic function was also reduced.  Biatrial enlargement.  Cardiology and nephrology were consulted for further input and evaluation.  Goal was for gradual BP reduction. He was started on amlodipine, hydralazine, and isordil. He also underwent further workup from nephrology regarding other causes of his renal failure aside from uncontrolled HTN.    Interval History:  No events overnight.  Blood pressure still remains significantly elevated this morning.  He is resting in bed essentially asymptomatic, denying any complaints of chest pain or shortness of breath.  Denies any headaches or  eye pressure, pain, change in vision. Mother is bedside as well.  Old records reviewed in assessment of this patient  ROS: Constitutional: negative for chills and fevers, Respiratory: positive for Shortness of breath, Cardiovascular: positive for chest pressure/discomfort and Gastrointestinal: negative for abdominal pain  Assessment & Plan: Hypertensive emergency - elevated troponin and creatinine on admission consistent with end organ failure/damage; still unclear baseline renal function - appreciate cardiology involvement in lower of BP; continue current regimen with adjustment as needed; patient started on amlodipine, hydralazine, isordil per cardiology; adjustment and timing of BP lowering deferred to cardiology  - meds further uptitrated on 7/26 per cardiology; BP remains well above goal  AKI (acute kidney injury) ([place]) - differential wide at this time; greatly appreciate nephrology assistance. Differential includes hypertensive nephropathy vs ATN 2/2 sustained pre-renal vs autoimmune process - Prot:creat = 7.71 consistent with nephrotic range - continue following nephrology workup - trend BMP - BP too high for kidney biopsy, but nephrology planning to order once BP more acceptable and safe range  - creatinine still uptrending since admission  - also possible trial of amiloride once renal function improves per nephrology   Hypertensive nephropathy - see AKI  Hypertensive cardiomyopathy ([place]) - LVH noted on EKG and echo; EF 25-30% with global hypokinesis of the left ventricle with LVH.  Grade 3 diastolic dysfunction.  Right ventricular systolic function was also reduced.  Biatrial enlargement. - not a cath candidate due to renal function - cardiology following and for now will be medical management until if/when renal function allows further ischemic and/or pulm HTN workup.   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; nephrology further working up as well - check  Mg daily - replete and recheck as needed - has normalized with repletion, will monitor trend   Normocytic anemia - more than likely is ACD; however may also be mixed - check iron studies and B12, folate  - iron studies low: start on oral iron - B12 low normal, start on oral supplement   Hypertension - see hypertensive emergency - will need multiple agents prior to discharge; once stable on a regimen will need to discuss with patient if financial assistance needed   Antimicrobials: None  DVT prophylaxis: H SQ Code Status: Full Family Communication: Mother bedside Disposition Plan:  Status is: Inpatient  Remains inpatient appropriate because:Hemodynamically unstable, Ongoing diagnostic testing needed not appropriate for outpatient work up, Unsafe d/c plan, IV treatments appropriate due to intensity of illness or inability to take PO and Inpatient level of care appropriate due to severity of illness   Dispo: The patient is from: Home              Anticipated d/c is to: Home              Anticipated d/c date is: > 3 days              Patient currently is not medically stable to d/c.  Objective: Blood pressure (!) 179/122, pulse 101, temperature 98.5 F (36.9 C), temperature source Oral, resp. rate 18, height 6\' 1"  (1.854 m), weight 82.2 kg, SpO2 98 %.  Examination: General appearance: alert, cooperative and appears stated age Head: Normocephalic, without obvious abnormality, atraumatic Eyes: EOMI Lungs: clear to auscultation bilaterally Heart: regular rate and rhythm and S1, S2 normal Abdomen: normal findings: bowel sounds normal and soft, non-tender Extremities: No significant lower extremity edema Skin: mobility and turgor normal Neurologic: Grossly normal   Consultants:   Cardiology  Nephrology  Procedures:   None  Data Reviewed: I have personally reviewed following labs and imaging studies Results for orders placed or performed during the hospital encounter of  05/09/20 (from the past 24 hour(s))  RPR     Status: None   Collection Time: 05/10/20  4:28 PM  Result Value Ref Range   RPR Ser Ql NON REACTIVE NON REACTIVE  Hepatitis B core antibody, total     Status: None   Collection Time: 05/10/20  4:28 PM  Result Value Ref Range   Hep B Core Total Ab NON REACTIVE NON REACTIVE  Hepatitis C antibody     Status: None   Collection Time: 05/10/20  4:28 PM  Result Value Ref Range   HCV Ab NON REACTIVE NON REACTIVE  Hepatitis B surface antigen     Status: None   Collection Time: 05/10/20  4:28 PM  Result Value Ref Range   Hepatitis B Surface Ag NON REACTIVE NON REACTIVE  Hepatitis B surface antibody     Status: Abnormal   Collection Time: 05/10/20  4:28 PM  Result Value Ref Range   Hepatitis B-Post <3.1 (L) Immunity>9.9 mIU/mL  Rapid HIV screen (HIV 1/2 Ab+Ag)     Status: None   Collection Time: 05/10/20  4:28 PM  Result Value Ref Range   HIV-1 P24 Antigen - HIV24 NON REACTIVE NON REACTIVE   HIV 1/2 Antibodies NON REACTIVE NON REACTIVE   Interpretation (HIV Ag Ab)      A non reactive test result means that HIV 1 or HIV 2 antibodies and HIV 1 p24 antigen were not detected in  the specimen.  Anti-DNA antibody, double-stranded     Status: None   Collection Time: 05/10/20  4:28 PM  Result Value Ref Range   ds DNA Ab <1 0 - 9 IU/mL  ANA w/Reflex if Positive     Status: None   Collection Time: 05/10/20  4:28 PM  Result Value Ref Range   Anti Nuclear Antibody (ANA) Negative Negative  Iron and TIBC     Status: Abnormal   Collection Time: 05/10/20  4:32 PM  Result Value Ref Range   Iron 29 (L) 45 - 182 ug/dL   TIBC 332 250 - 450 ug/dL   Saturation Ratios 9 (L) 17.9 - 39.5 %   UIBC 303 ug/dL  Ferritin     Status: None   Collection Time: 05/10/20  4:32 PM  Result Value Ref Range   Ferritin 118 24 - 336 ng/mL  Vitamin B12     Status: None   Collection Time: 05/10/20  4:32 PM  Result Value Ref Range   Vitamin B-12 328 180 - 914 pg/mL  Folate      Status: None   Collection Time: 05/10/20  4:32 PM  Result Value Ref Range   Folate 16.5 >5.9 ng/mL  Na and K (sodium & potassium), rand urine     Status: None   Collection Time: 05/10/20  5:07 PM  Result Value Ref Range   Sodium, Ur 48 mmol/L   Potassium Urine 39 mmol/L  Osmolality, urine     Status: None   Collection Time: 05/10/20  5:07 PM  Result Value Ref Range   Osmolality, Ur 440 300 - 900 mOsm/kg  CBC with Differential/Platelet     Status: Abnormal   Collection Time: 05/11/20  3:42 AM  Result Value Ref Range   WBC 14.9 (H) 4.0 - 10.5 K/uL   RBC 4.61 4.22 - 5.81 MIL/uL   Hemoglobin 12.0 (L) 13.0 - 17.0 g/dL   HCT 37.7 (L) 39 - 52 %   MCV 81.8 80.0 - 100.0 fL   MCH 26.0 26.0 - 34.0 pg   MCHC 31.8 30.0 - 36.0 g/dL   RDW 14.1 11.5 - 15.5 %   Platelets 258 150 - 400 K/uL   nRBC 0.0 0.0 - 0.2 %   Neutrophils Relative % 83 %   Neutro Abs 12.4 (H) 1.7 - 7.7 K/uL   Lymphocytes Relative 10 %   Lymphs Abs 1.5 0.7 - 4.0 K/uL   Monocytes Relative 5 %   Monocytes Absolute 0.8 0 - 1 K/uL   Eosinophils Relative 1 %   Eosinophils Absolute 0.1 0 - 0 K/uL   Basophils Relative 0 %   Basophils Absolute 0.0 0 - 0 K/uL   Immature Granulocytes 1 %   Abs Immature Granulocytes 0.07 0.00 - 0.07 K/uL  Comprehensive metabolic panel     Status: Abnormal   Collection Time: 05/11/20  3:42 AM  Result Value Ref Range   Sodium 138 135 - 145 mmol/L   Potassium 3.7 3.5 - 5.1 mmol/L   Chloride 105 98 - 111 mmol/L   CO2 20 (L) 22 - 32 mmol/L   Glucose, Bld 107 (H) 70 - 99 mg/dL   BUN 56 (H) 6 - 20 mg/dL   Creatinine, Ser 4.80 (H) 0.61 - 1.24 mg/dL   Calcium 8.9 8.9 - 10.3 mg/dL   Total Protein 6.4 (L) 6.5 - 8.1 g/dL   Albumin 3.4 (L) 3.5 - 5.0 g/dL   AST 17 15 - 41 U/L  ALT 23 0 - 44 U/L   Alkaline Phosphatase 64 38 - 126 U/L   Total Bilirubin 1.2 0.3 - 1.2 mg/dL   GFR calc non Af Amer 13 (L) >60 mL/min   GFR calc Af Amer 15 (L) >60 mL/min   Anion gap 13 5 - 15  Magnesium     Status: None     Collection Time: 05/11/20  3:42 AM  Result Value Ref Range   Magnesium 2.3 1.7 - 2.4 mg/dL  Phosphorus     Status: None   Collection Time: 05/11/20  3:42 AM  Result Value Ref Range   Phosphorus 3.8 2.5 - 4.6 mg/dL  Troponin I (High Sensitivity)     Status: Abnormal   Collection Time: 05/11/20  5:30 AM  Result Value Ref Range   Troponin I (High Sensitivity) 67 (H) <18 ng/L    Recent Results (from the past 240 hour(s))  SARS Coronavirus 2 by RT PCR (hospital order, performed in [place] hospital lab) Nasopharyngeal Nasopharyngeal Swab     Status: None   Collection Time: 05/09/20  2:59 PM   Specimen: Nasopharyngeal Swab  Result Value Ref Range Status   SARS Coronavirus 2 NEGATIVE NEGATIVE Final    Comment: (NOTE) SARS-CoV-2 target nucleic acids are NOT DETECTED.  The SARS-CoV-2 RNA is generally detectable in upper and lower respiratory specimens during the acute phase of infection. The lowest concentration of SARS-CoV-2 viral copies this assay can detect is 250 copies / mL. A negative result does not preclude SARS-CoV-2 infection and should not be used as the sole basis for treatment or other patient management decisions.  A negative result may occur with improper specimen collection / handling, submission of specimen other than nasopharyngeal swab, presence of viral mutation(s) within the areas targeted by this assay, and inadequate number of viral copies (<250 copies / mL). A negative result must be combined with clinical observations, patient history, and epidemiological information.  Fact Sheet for Patients:   StrictlyIdeas.no  Fact Sheet for Healthcare Providers: BankingDealers.co.za  This test is not yet approved or  cleared by the [place] FDA and has been authorized for detection and/or diagnosis of SARS-CoV-2 by FDA under an Emergency Use Authorization (EUA).  This EUA will remain in effect (meaning this test can  be used) for the duration of the COVID-19 declaration under Section 564(b)(1) of the Act, 21 U.S.C. section 360bbb-3(b)(1), unless the authorization is terminated or revoked sooner.  Performed at [facility], [address], [place], [place] [address]      Radiology Studies: US RENAL  Result Date: 05/10/2020 CLINICAL DATA:  Renal insufficiency. EXAM: RENAL / URINARY TRACT ULTRASOUND COMPLETE COMPARISON:  None. FINDINGS: Right Kidney: Renal measurements: 10.9 x 4.4 x 3.7 cm = volume: 93 mL. Moderate increased cortical echogenicity. No mass or hydronephrosis visualized. Left Kidney: Renal measurements: 10.3 x 5.8 x 3.3 cm = volume: 101 mL. Mild increased cortical echogenicity. No mass or hydronephrosis visualized. Bladder: Appears normal for degree of bladder distention. Other: None. IMPRESSION: Normal size kidneys with increased cortical echogenicity which can be seen in medical renal disease. No hydronephrosis. Electronically Signed   By: Marin Olp M.D.   On: 05/10/2020 08:22   ECHOCARDIOGRAM COMPLETE  Result Date: 05/10/2020    ECHOCARDIOGRAM REPORT   Patient Name:   Bobby Stevens Date of Exam: 05/10/2020 Medical Rec #:  [id number]       Height:       73.0 in Accession #:  [id number]      Weight:       182.8 lb Date of Birth:  [birth year]      BSA:          2.071 m Patient Age:    68 years        BP:           186/128 mmHg Patient Gender: M               HR:           97 bpm. Exam Location:  Inpatient Procedure: 2D Echo, Cardiac Doppler and Color Doppler Indications:    R94.31 Abnormal EKG; Elevated Troponin.  History:        Patient has no prior history of Echocardiogram examinations.                 Risk Factors:Hypertension.  Sonographer:    Jonelle Sidle Dance Referring Phys: [place]  1. Left ventricular ejection fraction, by estimation, is 25 to 30%. The left ventricle has severely decreased function. The left ventricle demonstrates global hypokinesis.  There is moderate concentric left ventricular hypertrophy. Left ventricular diastolic parameters are consistent with Grade III diastolic dysfunction (restrictive). Elevated left ventricular end-diastolic pressure.  2. Right ventricular systolic function is mildly reduced. The right ventricular size is normal. There is moderately elevated pulmonary artery systolic pressure.  3. Left atrial size was moderately dilated.  4. Right atrial size was mildly dilated.  5. The mitral valve is normal in structure. Mild to moderate mitral valve regurgitation.  6. Tricuspid valve regurgitation is moderate.  7. The aortic valve is grossly normal. Aortic valve regurgitation is not visualized. No aortic stenosis is present.  8. Aortic dilatation noted. There is mild to moderate dilatation of the ascending aorta measuring 43 mm.  9. The inferior vena cava is dilated in size with <50% respiratory variability, suggesting right atrial pressure of 15 mmHg. Comparison(s): No prior Echocardiogram. Conclusion(s)/Recommendation(s): Severely reduced LVEF with global hypokinesis. While LV measurements are within normal limits, appearance of mitral valve suggests mitral annular dilation secondary to LV dilation. Elevated LVEDP and elevated right sided pressures, with moderate bordering on severe pulmonary hypertension. FINDINGS  Left Ventricle: Left ventricular ejection fraction, by estimation, is 25 to 30%. The left ventricle has severely decreased function. The left ventricle demonstrates global hypokinesis. The left ventricular internal cavity size was normal in size. There is moderate concentric left ventricular hypertrophy. Left ventricular diastolic parameters are consistent with Grade III diastolic dysfunction (restrictive). Elevated left ventricular end-diastolic pressure. Right Ventricle: The right ventricular size is normal. Right vetricular wall thickness was not assessed. Right ventricular systolic function is mildly reduced. There  is moderately elevated pulmonary artery systolic pressure. The tricuspid regurgitant velocity is 3.26 m/s, and with an assumed right atrial pressure of 15 mmHg, the estimated right ventricular systolic pressure is 90.2 mmHg. Left Atrium: Left atrial size was moderately dilated. Right Atrium: Right atrial size was mildly dilated. Pericardium: A small pericardial effusion is present. The pericardial effusion is circumferential. Mitral Valve: The mitral valve is normal in structure. Mild to moderate mitral valve regurgitation. Tricuspid Valve: The tricuspid valve is normal in structure. Tricuspid valve regurgitation is moderate. Aortic Valve: The aortic valve is grossly normal. Aortic valve regurgitation is not visualized. No aortic stenosis is present. Pulmonic Valve: The pulmonic valve was not well visualized. Pulmonic valve regurgitation is mild to moderate. Aorta: Aortic dilatation noted. There is mild to moderate dilatation of the ascending  aorta measuring 43 mm. Venous: The inferior vena cava is dilated in size with less than 50% respiratory variability, suggesting right atrial pressure of 15 mmHg. IAS/Shunts: The atrial septum is grossly normal.  LEFT VENTRICLE PLAX 2D LVIDd:         4.90 cm  Diastology LVIDs:         4.20 cm  LV e' lateral:   5.15 cm/s LV PW:         1.60 cm  LV E/e' lateral: 20.7 LV IVS:        1.20 cm  LV e' medial:    4.19 cm/s LVOT diam:     2.30 cm  LV E/e' medial:  25.4 LV SV:         52 LV SV Index:   25 LVOT Area:     4.15 cm  RIGHT VENTRICLE            IVC RV Basal diam:  3.40 cm    IVC diam: 2.60 cm RV Mid diam:    2.00 cm RV S prime:     8.18 cm/s TAPSE (M-mode): 1.5 cm LEFT ATRIUM             Index       RIGHT ATRIUM           Index LA diam:        5.00 cm 2.41 cm/m  RA Area:     18.10 cm LA Vol (A2C):   85.9 ml 41.48 ml/m RA Volume:   56.50 ml  27.28 ml/m LA Vol (A4C):   88.0 ml 42.49 ml/m LA Biplane Vol: 87.2 ml 42.11 ml/m  AORTIC VALVE LVOT Vmax:   74.60 cm/s LVOT Vmean:   54.600 cm/s LVOT VTI:    0.126 m  AORTA Ao Root diam: 4.30 cm Ao Asc diam:  4.25 cm MITRAL VALVE                TRICUSPID VALVE MV Area (PHT): 4.04 cm     TR Peak grad:   42.5 mmHg MV Decel Time: 188 msec     TR Vmax:        326.00 cm/s MV E velocity: 106.50 cm/s MV A velocity: 44.00 cm/s   SHUNTS MV E/A ratio:  2.42         Systemic VTI:  0.13 m                             Systemic Diam: 2.30 cm Buford Dresser MD Electronically signed by Buford Dresser MD Signature Date/Time: 05/10/2020/10:40:47 AM    Final    US RENAL  Final Result    DG Chest Port 1 View  Final Result    US RENAL ARTERY DUPLEX COMPLETE    (Results Pending)     Scheduled Meds: . amLODipine  10 mg Oral Daily  . [START ON 05/12/2020] carvedilol  6.25 mg Oral BID WC  . ferrous sulfate  325 mg Oral Q breakfast  . heparin  5,000 Units Subcutaneous Q8H  . hydrALAZINE  100 mg Oral TID  . isosorbide dinitrate  40 mg Oral TID  . vitamin B-12  1,000 mcg Oral Daily   PRN Meds: acetaminophen **OR** acetaminophen, labetalol, ondansetron **OR** ondansetron (ZOFRAN) IV Continuous Infusions:    LOS: 2 days  Time spent: Greater than 50% of the 35 minute visit was spent in counseling/coordination of care for the patient as laid out  in the A&P.   Dwyane Dee, MD [facility] 05/11/2020, 3:00 PM   Contact via secure chat.  To contact the attending provider between 7A-7P or the covering provider during after hours 7P-7A, please log into the web site [email or web address] and access using universal [place] password for that web site. If you do not have the password, please call the hospital operator.

## 2020-05-11 NOTE — Consult Note (Addendum)
Admit: 05/09/2020 LOS: 2  53M with HTN emergency, AKI with nephrotic proteinuria  Subjective:  Bobby Stevens Kitchen BPs slowly improving, feels fuzzy/off; denies chest pain, vision changes, dyspnea, headache . On amlodipine full dose, carvedilol 6.25 mg twice daily, hydralazine 100 mg 3 times daily, isosorbide dinitrate 40 mg 3 times daily . Creatinine 4.9, BUN 53, K3.7 . PRA and aldosterone levels collected this morning  07/25 0701 - 07/26 0700 In: 1065 [P.O.:1065] Out: 500 [Urine:500]  Filed Weights   05/09/20 1850 05/10/20 0233 05/11/20 0429  Weight: 83.8 kg 82.9 kg 82.2 kg    Scheduled Meds: . amLODipine  10 mg Oral Daily  . [START ON 05/12/2020] carvedilol  6.25 mg Oral BID WC  . ferrous sulfate  325 mg Oral Q breakfast  . heparin  5,000 Units Subcutaneous Q8H  . hydrALAZINE  100 mg Oral TID  . isosorbide dinitrate  40 mg Oral TID  . vitamin B-12  1,000 mcg Oral Daily   Continuous Infusions: PRN Meds:.acetaminophen **OR** acetaminophen, labetalol, ondansetron **OR** ondansetron (ZOFRAN) IV  Current Labs: reviewed  Results for Bobby Stevens, Bobby Stevens (MRN [id number]) as of 05/11/2020 15:14  Ref. Range 05/09/2020 10:56  Protein Creatinine Ratio Latest Ref Range: 0.00 - 0.15 mg/mgCre 7.71 (H)   NEGATIVE: HIV, HBV, HCV, ANA, dsDNA PENDING: Renin/Aldo levels, ANCA, SFLC, SPEP   Physical Exam:  Blood pressure (!) 179/122, pulse 101, temperature 98.5 F (36.9 C), temperature source Oral, resp. rate 18, height 6\' 1"  (1.854 m), weight 82.2 kg, SpO2 98 %. NAD, well-appearing Regular, normal S1 and S2 CTA B No peripheral edema Soft, nontender No rashes or lesions Nonfocal, CN II through XII intact  A 1. Renal failure, unclear chronicity, UPC with nephrotic range proteinuria 2. Hypertensive emergency, suspect subacute or chronic uncontrolled severe hypertension 3. Systolic heart failure, chronic; hypertensive heart disease 4. Severe hypokalemia  P . Continue to uptitrate beta-blocker, given heart  failure, will leave this to cardiology . Start spironolactone 25 mg every morning, first dose today, trend potassium . Next would add on further loop diuretic and consider use of minoxidil if blood pressure remains elevated, if cardiology is in agreement . Follow-up outstanding labs: PRA and Aldo levels, pheochromocytoma eval . Currently not feasible to do a biopsy, but this might require following up as an outpatient . ANCA pending . Daily weights, Daily Renal Panel, Strict I/Os, Avoid nephrotoxins (NSAIDs, judicious IV Contrast)   Bobby Grippe MD 05/11/2020, 3:13 PM  Recent Labs  Lab 05/09/20 1240 05/10/20 0507 05/11/20 0342  NA 134* 136 138  K 2.9* 2.8* 3.7  CL 98 102 105  CO2 22 25 20*  GLUCOSE 133* 107* 107*  BUN 57* 53* 56*  CREATININE 4.55* 4.61* 4.80*  CALCIUM 9.0 8.8* 8.9  PHOS  --   --  3.8   Recent Labs  Lab 05/09/20 1240 05/10/20 0507 05/11/20 0342  WBC 15.0* 12.2* 14.9*  NEUTROABS  --   --  12.4*  HGB 13.6 12.0* 12.0*  HCT 41.3 36.6* 37.7*  MCV 80.2 81.3 81.8  PLT 272 228 258

## 2020-05-12 ENCOUNTER — Inpatient Hospital Stay (HOSPITAL_COMMUNITY): Payer: Self-pay

## 2020-05-12 LAB — COMPREHENSIVE METABOLIC PANEL
ALT: 20 U/L (ref 0–44)
AST: 16 U/L (ref 15–41)
Albumin: 3 g/dL — ABNORMAL LOW (ref 3.5–5.0)
Alkaline Phosphatase: 52 U/L (ref 38–126)
Anion gap: 11 (ref 5–15)
BUN: 53 mg/dL — ABNORMAL HIGH (ref 6–20)
CO2: 21 mmol/L — ABNORMAL LOW (ref 22–32)
Calcium: 8.7 mg/dL — ABNORMAL LOW (ref 8.9–10.3)
Chloride: 103 mmol/L (ref 98–111)
Creatinine, Ser: 4.93 mg/dL — ABNORMAL HIGH (ref 0.61–1.24)
GFR calc Af Amer: 15 mL/min — ABNORMAL LOW (ref >60)
GFR calc non Af Amer: 13 mL/min — ABNORMAL LOW (ref >60)
Glucose, Bld: 120 mg/dL — ABNORMAL HIGH (ref 70–99)
Potassium: 3.7 mmol/L (ref 3.5–5.1)
Sodium: 135 mmol/L (ref 135–145)
Total Bilirubin: 1.6 mg/dL — ABNORMAL HIGH (ref 0.3–1.2)
Total Protein: 6 g/dL — ABNORMAL LOW (ref 6.5–8.1)

## 2020-05-12 LAB — PROTEIN ELECTROPHORESIS, SERUM
A/G Ratio: 1.1 (ref 0.7–1.7)
Albumin ELP: 3.1 g/dL (ref 2.9–4.4)
Alpha-1-Globulin: 0.2 g/dL (ref 0.0–0.4)
Alpha-2-Globulin: 0.6 g/dL (ref 0.4–1.0)
Beta Globulin: 1 g/dL (ref 0.7–1.3)
Gamma Globulin: 1 g/dL (ref 0.4–1.8)
Globulin, Total: 2.9 g/dL (ref 2.2–3.9)
Total Protein ELP: 6 g/dL (ref 6.0–8.5)

## 2020-05-12 LAB — CBC WITH DIFFERENTIAL/PLATELET
Abs Immature Granulocytes: 0.1 10*3/uL — ABNORMAL HIGH (ref 0.00–0.07)
Basophils Absolute: 0 10*3/uL (ref 0.0–0.1)
Basophils Relative: 0 %
Eosinophils Absolute: 0.1 10*3/uL (ref 0.0–0.5)
Eosinophils Relative: 1 %
HCT: 33.4 % — ABNORMAL LOW (ref 39.0–52.0)
Hemoglobin: 10.7 g/dL — ABNORMAL LOW (ref 13.0–17.0)
Immature Granulocytes: 1 %
Lymphocytes Relative: 9 %
Lymphs Abs: 1.3 10*3/uL (ref 0.7–4.0)
MCH: 26.6 pg (ref 26.0–34.0)
MCHC: 32 g/dL (ref 30.0–36.0)
MCV: 82.9 fL (ref 80.0–100.0)
Monocytes Absolute: 0.8 10*3/uL (ref 0.1–1.0)
Monocytes Relative: 5 %
Neutro Abs: 11.6 10*3/uL — ABNORMAL HIGH (ref 1.7–7.7)
Neutrophils Relative %: 84 %
Platelets: 212 10*3/uL (ref 150–400)
RBC: 4.03 MIL/uL — ABNORMAL LOW (ref 4.22–5.81)
RDW: 14.2 % (ref 11.5–15.5)
WBC: 13.9 10*3/uL — ABNORMAL HIGH (ref 4.0–10.5)
nRBC: 0 % (ref 0.0–0.2)

## 2020-05-12 LAB — TSH: TSH: 2.067 u[IU]/mL (ref 0.350–4.500)

## 2020-05-12 LAB — ANCA TITERS
Atypical P-ANCA titer: <1:20 {titer}
C-ANCA: <1:20 {titer}
P-ANCA: <1:20 {titer}

## 2020-05-12 LAB — T4, FREE: Free T4: 1.18 ng/dL — ABNORMAL HIGH (ref 0.61–1.12)

## 2020-05-12 LAB — PHOSPHORUS: Phosphorus: 4.4 mg/dL (ref 2.5–4.6)

## 2020-05-12 LAB — MAGNESIUM: Magnesium: 2.1 mg/dL (ref 1.7–2.4)

## 2020-05-12 MED ORDER — SPIRONOLACTONE 25 MG PO TABS
25.0000 mg | ORAL_TABLET | Freq: Every day | ORAL | Status: DC
  Administered 2020-05-12 – 2020-05-15 (×4): 25 mg via ORAL
  Filled 2020-05-12 (×4): qty 1

## 2020-05-12 NOTE — Plan of Care (Signed)
  Problem: Education: Goal: Knowledge of General Education information will improve Description: Including pain rating scale, medication(s)/side effects and non-pharmacologic comfort measures Outcome: Progressing   Problem: Health Behavior/Discharge Planning: Goal: Ability to manage health-related needs will improve Outcome: Progressing   Problem: Clinical Measurements: Goal: Ability to maintain clinical measurements within normal limits will improve Outcome: Progressing Goal: Diagnostic test results will improve Outcome: Progressing Goal: Cardiovascular complication will be avoided Outcome: Progressing   Problem: Elimination: Goal: Will not experience complications related to bowel motility Outcome: Progressing Goal: Will not experience complications related to urinary retention Outcome: Progressing   Problem: Pain Managment: Goal: General experience of comfort will improve Outcome: Progressing   Problem: Safety: Goal: Ability to remain free from injury will improve Outcome: Progressing   Problem: Skin Integrity: Goal: Risk for impaired skin integrity will decrease Outcome: Progressing

## 2020-05-12 NOTE — Progress Notes (Signed)
Pharmacist Heart Failure Core Measure Documentation  Assessment: Bobby Stevens has an EF documented as 25-30% on 05/10/2020 by ECHO.  Rationale: Heart failure patients with left ventricular systolic dysfunction (LVSD) and an EF < 40% should be prescribed an angiotensin converting enzyme inhibitor (ACEI) or angiotensin receptor blocker (ARB) at discharge unless a contraindication is documented in the medical record.  This patient is not currently on an ACEI or ARB for HF.  This note is being placed in the record in order to provide documentation that a contraindication to the use of these agents is present for this encounter.  ACE Inhibitor or Angiotensin Receptor Blocker is contraindicated (specify all that apply)  []   ACEI allergy AND ARB allergy []   Angioedema []   Moderate or severe aortic stenosis []   Hyperkalemia []   Hypotension []   Renal artery stenosis [x]   Worsening renal function, preexisting renal disease or dysfunction   Bobby Stevens, PharmD, BCCCP Clinical Pharmacist  Phone: [phone] 05/12/2020 3:20 PM  Please check AMION for all [place] phone numbers After 10:00 PM, call [place] [phone]

## 2020-05-12 NOTE — Progress Notes (Signed)
PROGRESS NOTE    Bobby Stevens   OQH:[id number]  DOB: 02-20-1971  DOA: 05/09/2020     3  PCP: System, Pcp Not In  CC: elevated BP at home, weakness, lightheadedness  Hospital Course: [name] is a 49 yo AA male with PMH HTN (severely uncontrolled), medication noncompliance 2/2 financial constraints who presented to the ER with complaints of feeling weak and lightheaded.  He was also short of breath with minimal exertion and also describing symptoms of orthopnea. His blood pressure was excessively elevated on admission, 246/181 initially.  He sustained diastolic blood pressures in the 150s to 180s for several hours. His mother the following morning from admission also stated that his blood pressures have been running that high at home as well.  They knew it was high but due to him being relatively asymptomatic initially over the past several months, did not put much thought into it. On lab work-up he was notable to have elevated BUN and creatinine, 57 and 4.55 respectively. Troponins were also checked and trended.  105, 115, 79. BNP was also elevated.  2048, 1827. EKG initially in the ER revealed sinus tachycardia with significant LVH. An echo was also obtained which showed reduced EF, 25 to 30% and global hypokinesis of the left ventricle with LVH.  Grade 3 diastolic dysfunction.  Right ventricular systolic function was also reduced.  Biatrial enlargement.  Cardiology and nephrology were consulted for further input and evaluation.  Goal was for gradual BP reduction. He was started on amlodipine, hydralazine, and isordil. He also underwent further workup from nephrology regarding other causes of his renal failure aside from uncontrolled HTN.    Interval History:   Patient feels generally okay.  His only note is that he is still slightly short of breath but states it is much much improved from admission.  He is getting out of bed and going to the bathroom.  No chest pain.  No  headache.  Old records reviewed in assessment of this patient  ROS: Patient denies any chest pain or pressure.  He does have some mild shortness of breath much improved from admission.  No abdominal pain.  No dysuria.  No constipation.  No dizziness.  No headache.  Assessment & Plan:  Hypertensive emergency Blood pressure is being managed by cardiology and nephrology They have been bringing blood pressure down slowly, would need to avoid watershed stroke since we do not know how long he has been this hypertensive for, possibly for a while. Blood pressure is still not at goal but much improved from admission. Work-up for secondary hypertension including Pheo and hyperaldosteronemia are ongoing per renal  Plan is to continue amlodipine 10 and hydralazine 100 3 times daily  Cardiology to uptitrate beta-blocker per renal recommendations Renal has added spironolactone is only only on 25, they can uptitrate as warranted Both continue to follow actively  Hypertensive nephropathy with acute kidney injury Appreciate ongoing management by nephrology Unclear etiology although clearly hypertensive kidney failure is high on the differential Unfortunately unable to do renal biopsy due to uncontrolled blood pressure Patient also does have nephrotic range proteinuria with protein creatinine ratio 7  Hypertensive cardiomyopathy Echo has both diastolic and systolic heart failure with grade 3 diastolic dysfunction with market LVH but also EF of 25 to 30% with global hypokinesis of the left ventricle. Renal function also precludes cath for now. Continue efforts at BP management  Hypokalemia Has been within normal limits over the past couple of days Never previously had  been difficult to treat, there is a concern for possible Liddle syndrome, renal working up as well. Magnesium within normal limits  Normocytic anemia Multifactorial, secondary to B12 deficiency, on oral B12 Also has low iron studies,  on oral iron Likely component of anemia of chronic disease as well   Antimicrobials: None  DVT prophylaxis: H SQ Code Status: Full Family Communication: Patient states his family has been involved along and he will continue to be in communication with them home Disposition Plan:  Status is: Inpatient  Remains inpatient appropriate because:Hemodynamically unstable, Ongoing diagnostic testing needed not appropriate for outpatient work up, Unsafe d/c plan, IV treatments appropriate due to intensity of illness or inability to take PO and Inpatient level of care appropriate due to severity of illness   Dispo: The patient is from: Home              Anticipated d/c is to: Home              Anticipated d/c date is: > 3 days              Patient currently is not medically stable to d/c.  Objective: Blood pressure (!) 164/113, pulse 92, temperature 98.7 F (37.1 C), temperature source Oral, resp. rate 18, height 6\' 1"  (1.854 m), weight 82.3 kg, SpO2 98 %.  Examination: General appearance: Remarkably well-appearing man in no acute distress, coherent and cooperative Head: Normocephalic, without obvious abnormality, atraumatic Eyes: EOMI Lungs: clear to auscultation bilaterally Heart: regular rate and rhythm and S1, S2 normal Abdomen: normal findings: bowel sounds normal and soft, non-tender Extremities: No significant lower extremity edema Skin: mobility and turgor normal Neurologic: Grossly normal   Consultants:   Cardiology  Nephrology  Procedures:   None  Data Reviewed: I have personally reviewed following labs and imaging studies Results for orders placed or performed during the hospital encounter of 05/09/20 (from the past 24 hour(s))  CBC with Differential/Platelet     Status: Abnormal   Collection Time: 05/12/20  3:54 AM  Result Value Ref Range   WBC 13.9 (H) 4.0 - 10.5 K/uL   RBC 4.03 (L) 4.22 - 5.81 MIL/uL   Hemoglobin 10.7 (L) 13.0 - 17.0 g/dL   HCT 33.4 (L) 39 - 52  %   MCV 82.9 80.0 - 100.0 fL   MCH 26.6 26.0 - 34.0 pg   MCHC 32.0 30.0 - 36.0 g/dL   RDW 14.2 11.5 - 15.5 %   Platelets 212 150 - 400 K/uL   nRBC 0.0 0.0 - 0.2 %   Neutrophils Relative % 84 %   Neutro Abs 11.6 (H) 1.7 - 7.7 K/uL   Lymphocytes Relative 9 %   Lymphs Abs 1.3 0.7 - 4.0 K/uL   Monocytes Relative 5 %   Monocytes Absolute 0.8 0 - 1 K/uL   Eosinophils Relative 1 %   Eosinophils Absolute 0.1 0 - 0 K/uL   Basophils Relative 0 %   Basophils Absolute 0.0 0 - 0 K/uL   Immature Granulocytes 1 %   Abs Immature Granulocytes 0.10 (H) 0.00 - 0.07 K/uL  Comprehensive metabolic panel     Status: Abnormal   Collection Time: 05/12/20  3:54 AM  Result Value Ref Range   Sodium 135 135 - 145 mmol/L   Potassium 3.7 3.5 - 5.1 mmol/L   Chloride 103 98 - 111 mmol/L   CO2 21 (L) 22 - 32 mmol/L   Glucose, Bld 120 (H) 70 - 99 mg/dL  BUN 53 (H) 6 - 20 mg/dL   Creatinine, Ser 4.93 (H) 0.61 - 1.24 mg/dL   Calcium 8.7 (L) 8.9 - 10.3 mg/dL   Total Protein 6.0 (L) 6.5 - 8.1 g/dL   Albumin 3.0 (L) 3.5 - 5.0 g/dL   AST 16 15 - 41 U/L   ALT 20 0 - 44 U/L   Alkaline Phosphatase 52 38 - 126 U/L   Total Bilirubin 1.6 (H) 0.3 - 1.2 mg/dL   GFR calc non Af Amer 13 (L) >60 mL/min   GFR calc Af Amer 15 (L) >60 mL/min   Anion gap 11 5 - 15  Magnesium     Status: None   Collection Time: 05/12/20  3:54 AM  Result Value Ref Range   Magnesium 2.1 1.7 - 2.4 mg/dL  Phosphorus     Status: None   Collection Time: 05/12/20  3:54 AM  Result Value Ref Range   Phosphorus 4.4 2.5 - 4.6 mg/dL  TSH     Status: None   Collection Time: 05/12/20  3:54 AM  Result Value Ref Range   TSH 2.067 0.350 - 4.500 uIU/mL  T4, free     Status: Abnormal   Collection Time: 05/12/20  3:54 AM  Result Value Ref Range   Free T4 1.18 (H) 0.61 - 1.12 ng/dL    Recent Results (from the past 240 hour(s))  SARS Coronavirus 2 by RT PCR (hospital order, performed in [place] hospital lab) Nasopharyngeal Nasopharyngeal Swab      Status: None   Collection Time: 05/09/20  2:59 PM   Specimen: Nasopharyngeal Swab  Result Value Ref Range Status   SARS Coronavirus 2 NEGATIVE NEGATIVE Final    Comment: (NOTE) SARS-CoV-2 target nucleic acids are NOT DETECTED.  The SARS-CoV-2 RNA is generally detectable in upper and lower respiratory specimens during the acute phase of infection. The lowest concentration of SARS-CoV-2 viral copies this assay can detect is 250 copies / mL. A negative result does not preclude SARS-CoV-2 infection and should not be used as the sole basis for treatment or other patient management decisions.  A negative result may occur with improper specimen collection / handling, submission of specimen other than nasopharyngeal swab, presence of viral mutation(s) within the areas targeted by this assay, and inadequate number of viral copies (<250 copies / mL). A negative result must be combined with clinical observations, patient history, and epidemiological information.  Fact Sheet for Patients:   StrictlyIdeas.no  Fact Sheet for Healthcare Providers: BankingDealers.co.za  This test is not yet approved or  cleared by the [place] FDA and has been authorized for detection and/or diagnosis of SARS-CoV-2 by FDA under an Emergency Use Authorization (EUA).  This EUA will remain in effect (meaning this test can be used) for the duration of the COVID-19 declaration under Section 564(b)(1) of the Act, 21 U.S.C. section 360bbb-3(b)(1), unless the authorization is terminated or revoked sooner.  Performed at [facility], [address] [place], [place] [address]      Radiology Studies: No results found. US RENAL  Final Result    DG Chest Port 1 View  Final Result    VAS US RENAL ARTERY DUPLEX    (Results Pending)     Scheduled Meds:  amLODipine  10 mg Oral Daily   carvedilol  6.25 mg Oral BID WC   ferrous sulfate  325 mg Oral  Q breakfast   heparin  5,000 Units Subcutaneous Q8H   hydrALAZINE  100 mg Oral TID   isosorbide dinitrate  40 mg Oral TID   spironolactone  25 mg Oral Daily   vitamin B-12  1,000 mcg Oral Daily   PRN Meds: acetaminophen **OR** acetaminophen, labetalol, ondansetron **OR** ondansetron (ZOFRAN) IV Continuous Infusions:    LOS: 3 days  Time spent: Greater than 50% of the 35 minute visit was spent in counseling/coordination of care for the patient as laid out in the A&P.   Vashti Hey, MD [facility] 05/12/2020, 2:21 PM   Contact via secure chat.  To contact the attending provider between 7A-7P or the covering provider during after hours 7P-7A, please log into the web site [email or web address] and access using universal  password for that web site. If you do not have the password, please call the hospital operator.

## 2020-05-12 NOTE — Progress Notes (Signed)
Admit: 05/09/2020 LOS: 2  32M with HTN emergency, AKI with nephrotic proteinuria  Subjective:  Bobby Stevens BPs slowly improving, feels fuzzy/off; denies chest pain, vision changes, dyspnea, headache . On amlodipine full dose, carvedilol 6.25 mg twice daily, hydralazine 100 mg 3 times daily, isosorbide dinitrate 40 mg 3 times daily . Creatinine 4.9, BUN 53, K3.7 . PRA and aldosterone levels collected this morning  07/25 0701 - 07/26 0700 In: 1065 [P.O.:1065] Out: 500 [Urine:500]  Filed Weights   05/09/20 1850 05/10/20 0233 05/11/20 0429  Weight: 83.8 kg 82.9 kg 82.2 kg    Scheduled Meds: . amLODipine  10 mg Oral Daily  . [START ON 05/12/2020] carvedilol  6.25 mg Oral BID WC  . ferrous sulfate  325 mg Oral Q breakfast  . heparin  5,000 Units Subcutaneous Q8H  . hydrALAZINE  100 mg Oral TID  . isosorbide dinitrate  40 mg Oral TID  . vitamin B-12  1,000 mcg Oral Daily   Continuous Infusions: PRN Meds:.acetaminophen **OR** acetaminophen, labetalol, ondansetron **OR** ondansetron (ZOFRAN) IV  Current Labs: reviewed  Results for CAELEB, BATALLA (MRN [id number]) as of 05/11/2020 15:14  Ref. Range 05/09/2020 10:56  Protein Creatinine Ratio Latest Ref Range: 0.00 - 0.15 mg/mgCre 7.71 (H)   NEGATIVE: HIV, HBV, HCV, ANA, dsDNA PENDING: Renin/Aldo levels, ANCA, SFLC, SPEP   Physical Exam:  Blood pressure (!) 179/122, pulse 101, temperature 98.5 F (36.9 C), temperature source Oral, resp. rate 18, height 6\' 1"  (1.854 m), weight 82.2 kg, SpO2 98 %. NAD, well-appearing Regular, normal S1 and S2 CTA B No peripheral edema Soft, nontender No rashes or lesions Nonfocal, CN II through XII intact  A 1. Renal failure, unclear chronicity, UPC with nephrotic range proteinuria 2. Hypertensive emergency, suspect subacute or chronic uncontrolled severe hypertension 3. Systolic heart failure, chronic; hypertensive heart disease 4. Severe hypokalemia  P . Continue to uptitrate beta-blocker, given heart  failure, will leave this to cardiology . Start spironolactone 25 mg every morning, first dose today, trend potassium . Next would add on further loop diuretic and consider use of minoxidil if blood pressure remains elevated, if cardiology is in agreement . Follow-up outstanding labs: PRA and Aldo levels, pheochromocytoma eval . Currently not feasible to do a biopsy, but this might require following up as an outpatient . ANCA pending . Daily weights, Daily Renal Panel, Strict I/Os, Avoid nephrotoxins (NSAIDs, judicious IV Contrast)   Pearson Grippe MD 05/11/2020, 3:13 PM  Recent Labs  Lab 05/09/20 1240 05/10/20 0507 05/11/20 0342  NA 134* 136 138  K 2.9* 2.8* 3.7  CL 98 102 105  CO2 22 25 20*  GLUCOSE 133* 107* 107*  BUN 57* 53* 56*  CREATININE 4.55* 4.61* 4.80*  CALCIUM 9.0 8.8* 8.9  PHOS  --   --  3.8   Recent Labs  Lab 05/09/20 1240 05/10/20 0507 05/11/20 0342  WBC 15.0* 12.2* 14.9*  NEUTROABS  --   --  12.4*  HGB 13.6 12.0* 12.0*  HCT 41.3 36.6* 37.7*  MCV 80.2 81.3 81.8  PLT 272 228 258

## 2020-05-12 NOTE — Progress Notes (Signed)
Progress Note  Patient Name: Bobby Stevens Date of Encounter: 05/12/2020  [facility] Cardiologist: Buford Dresser, MD   Subjective   No chest pain or SOB.   Inpatient Medications    Scheduled Meds: . amLODipine  10 mg Oral Daily  . carvedilol  6.25 mg Oral BID WC  . ferrous sulfate  325 mg Oral Q breakfast  . heparin  5,000 Units Subcutaneous Q8H  . hydrALAZINE  100 mg Oral TID  . isosorbide dinitrate  40 mg Oral TID  . vitamin B-12  1,000 mcg Oral Daily   Continuous Infusions:  PRN Meds: acetaminophen **OR** acetaminophen, labetalol, ondansetron **OR** ondansetron (ZOFRAN) IV   Vital Signs    Vitals:   05/12/20 0629 05/12/20 0631 05/12/20 0725 05/12/20 0811  BP:  (!) 195/125  (!) 164/113  Pulse:      Resp:  18    Temp:  99.2 F (37.3 C) 98.5 F (36.9 C)   TempSrc:  Oral Oral   SpO2:  99%    Weight: 82.3 kg     Height:        Intake/Output Summary (Last 24 hours) at 05/12/2020 0839 Last data filed at 05/12/2020 0700 Gross per 24 hour  Intake --  Output 900 ml  Net -900 ml   Last 3 Weights 05/12/2020 05/11/2020 05/10/2020  Weight (lbs) 181 lb 6.4 oz 181 lb 3.2 oz 182 lb 12.8 oz  Weight (kg) 82.283 kg 82.192 kg 82.918 kg      Telemetry    SR, ST, low 100s - Personally Reviewed  ECG    No new ECG tracing today. - Personally Reviewed  Physical Exam   General: Well developed, well nourished, male in no acute distress Head: Eyes PERRLA, Head normocephalic and atraumatic Lungs: clear bilaterally to auscultation. Heart: HRRR S1 S2, without rub or gallop. No murmur. 4/4 extremity pulses are 2+ & equal. JVD 10 cm. Abdomen: Bowel sounds are present, abdomen soft and non-tender without masses or  hernias noted. Msk: Normal strength and tone for age. Extremities: No clubbing, cyanosis or edema.    Skin:  No rashes or lesions noted. Neuro: Alert and oriented X 3. Psych:  Good affect, responds appropriately  Labs    High Sensitivity Troponin:    Recent Labs  Lab 05/09/20 1240 05/09/20 1438 05/10/20 0807 05/11/20 0530  TROPONINIHS 105* 115* 79* 67*      Chemistry Recent Labs  Lab 05/10/20 0507 05/10/20 0807 05/11/20 0342 05/12/20 0354  NA 136  --  138 135  K 2.8*  --  3.7 3.7  CL 102  --  105 103  CO2 25  --  20* 21*  GLUCOSE 107*  --  107* 120*  BUN 53*  --  56* 53*  CREATININE 4.61*  --  4.80* 4.93*  CALCIUM 8.8*  --  8.9 8.7*  PROT  --  6.2* 6.4* 6.0*  ALBUMIN  --  3.1* 3.4* 3.0*  AST  --  31 17 16   ALT  --  26 23 20   ALKPHOS  --  59 64 52  BILITOT  --  1.3* 1.2 1.6*  GFRNONAA 14*  --  13* 13*  GFRAA 16*  --  15* 15*  ANIONGAP 9  --  13 11     Hematology Recent Labs  Lab 05/10/20 0507 05/11/20 0342 05/12/20 0354  WBC 12.2* 14.9* 13.9*  RBC 4.50 4.61 4.03*  HGB 12.0* 12.0* 10.7*  HCT 36.6* 37.7* 33.4*  MCV 81.3  81.8 82.9  MCH 26.7 26.0 26.6  MCHC 32.8 31.8 32.0  RDW 13.6 14.1 14.2  PLT 228 258 212    BNP Recent Labs  Lab 05/09/20 1240 05/10/20 0807  BNP 2,048.3* 1,827.0*     DDimer  Recent Labs  Lab 05/09/20 1240  DDIMER 0.87*     Radiology    ECHOCARDIOGRAM COMPLETE  Result Date: 05/10/2020    ECHOCARDIOGRAM REPORT   Patient Name:   Bobby Stevens Date of Exam: 05/10/2020 Medical Rec #:  [id number]       Height:       73.0 in Accession #:    [id number]      Weight:       182.8 lb Date of Birth:  03/14/1971      BSA:          2.071 m Patient Age:    49 years        BP:           186/128 mmHg Patient Gender: M               HR:           97 bpm. Exam Location:  Inpatient Procedure: 2D Echo, Cardiac Doppler and Color Doppler Indications:    R94.31 Abnormal EKG; Elevated Troponin.  History:        Patient has no prior history of Echocardiogram examinations.                 Risk Factors:Hypertension.  Sonographer:    Jonelle Sidle Dance Referring Phys: [place]  1. Left ventricular ejection fraction, by estimation, is 25 to 30%. The left ventricle has severely decreased  function. The left ventricle demonstrates global hypokinesis. There is moderate concentric left ventricular hypertrophy. Left ventricular diastolic parameters are consistent with Grade III diastolic dysfunction (restrictive). Elevated left ventricular end-diastolic pressure.  2. Right ventricular systolic function is mildly reduced. The right ventricular size is normal. There is moderately elevated pulmonary artery systolic pressure.  3. Left atrial size was moderately dilated.  4. Right atrial size was mildly dilated.  5. The mitral valve is normal in structure. Mild to moderate mitral valve regurgitation.  6. Tricuspid valve regurgitation is moderate.  7. The aortic valve is grossly normal. Aortic valve regurgitation is not visualized. No aortic stenosis is present.  8. Aortic dilatation noted. There is mild to moderate dilatation of the ascending aorta measuring 43 mm.  9. The inferior vena cava is dilated in size with <50% respiratory variability, suggesting right atrial pressure of 15 mmHg. Comparison(s): No prior Echocardiogram. Conclusion(s)/Recommendation(s): Severely reduced LVEF with global hypokinesis. While LV measurements are within normal limits, appearance of mitral valve suggests mitral annular dilation secondary to LV dilation. Elevated LVEDP and elevated right sided pressures, with moderate bordering on severe pulmonary hypertension. FINDINGS  Left Ventricle: Left ventricular ejection fraction, by estimation, is 25 to 30%. The left ventricle has severely decreased function. The left ventricle demonstrates global hypokinesis. The left ventricular internal cavity size was normal in size. There is moderate concentric left ventricular hypertrophy. Left ventricular diastolic parameters are consistent with Grade III diastolic dysfunction (restrictive). Elevated left ventricular end-diastolic pressure. Right Ventricle: The right ventricular size is normal. Right vetricular wall thickness was not assessed.  Right ventricular systolic function is mildly reduced. There is moderately elevated pulmonary artery systolic pressure. The tricuspid regurgitant velocity is 3.26 m/s, and with an assumed right atrial pressure of 15 mmHg, the estimated right ventricular systolic  pressure is 57.5 mmHg. Left Atrium: Left atrial size was moderately dilated. Right Atrium: Right atrial size was mildly dilated. Pericardium: A small pericardial effusion is present. The pericardial effusion is circumferential. Mitral Valve: The mitral valve is normal in structure. Mild to moderate mitral valve regurgitation. Tricuspid Valve: The tricuspid valve is normal in structure. Tricuspid valve regurgitation is moderate. Aortic Valve: The aortic valve is grossly normal. Aortic valve regurgitation is not visualized. No aortic stenosis is present. Pulmonic Valve: The pulmonic valve was not well visualized. Pulmonic valve regurgitation is mild to moderate. Aorta: Aortic dilatation noted. There is mild to moderate dilatation of the ascending aorta measuring 43 mm. Venous: The inferior vena cava is dilated in size with less than 50% respiratory variability, suggesting right atrial pressure of 15 mmHg. IAS/Shunts: The atrial septum is grossly normal.  LEFT VENTRICLE PLAX 2D LVIDd:         4.90 cm  Diastology LVIDs:         4.20 cm  LV e' lateral:   5.15 cm/s LV PW:         1.60 cm  LV E/e' lateral: 20.7 LV IVS:        1.20 cm  LV e' medial:    4.19 cm/s LVOT diam:     2.30 cm  LV E/e' medial:  25.4 LV SV:         52 LV SV Index:   25 LVOT Area:     4.15 cm  RIGHT VENTRICLE            IVC RV Basal diam:  3.40 cm    IVC diam: 2.60 cm RV Mid diam:    2.00 cm RV S prime:     8.18 cm/s TAPSE (M-mode): 1.5 cm LEFT ATRIUM             Index       RIGHT ATRIUM           Index LA diam:        5.00 cm 2.41 cm/m  RA Area:     18.10 cm LA Vol (A2C):   85.9 ml 41.48 ml/m RA Volume:   56.50 ml  27.28 ml/m LA Vol (A4C):   88.0 ml 42.49 ml/m LA Biplane Vol: 87.2 ml  42.11 ml/m  AORTIC VALVE LVOT Vmax:   74.60 cm/s LVOT Vmean:  54.600 cm/s LVOT VTI:    0.126 m  AORTA Ao Root diam: 4.30 cm Ao Asc diam:  4.25 cm MITRAL VALVE                TRICUSPID VALVE MV Area (PHT): 4.04 cm     TR Peak grad:   42.5 mmHg MV Decel Time: 188 msec     TR Vmax:        326.00 cm/s MV E velocity: 106.50 cm/s MV A velocity: 44.00 cm/s   SHUNTS MV E/A ratio:  2.42         Systemic VTI:  0.13 m                             Systemic Diam: 2.30 cm Buford Dresser MD Electronically signed by Buford Dresser MD Signature Date/Time: 05/10/2020/10:40:47 AM    Final     Cardiac Studies   Echocardiogram 05/10/2020: Impressions: 1. Left ventricular ejection fraction, by estimation, is 25 to 30%. The  left ventricle has severely decreased function. The left ventricle  demonstrates global  hypokinesis. There is moderate concentric left  ventricular hypertrophy. Left ventricular  diastolic parameters are consistent with Grade III diastolic dysfunction  (restrictive). Elevated left ventricular end-diastolic pressure.  2. Right ventricular systolic function is mildly reduced. The right  ventricular size is normal. There is moderately elevated pulmonary artery  systolic pressure.  3. Left atrial size was moderately dilated.  4. Right atrial size was mildly dilated.  5. The mitral valve is normal in structure. Mild to moderate mitral valve  regurgitation.  6. Tricuspid valve regurgitation is moderate.  7. The aortic valve is grossly normal. Aortic valve regurgitation is not  visualized. No aortic stenosis is present.  8. Aortic dilatation noted. There is mild to moderate dilatation of the  ascending aorta measuring 43 mm.  9. The inferior vena cava is dilated in size with <50% respiratory  variability, suggesting right atrial pressure of 15 mmHg.   Comparison(s): No prior Echocardiogram.   Conclusion(s)/Recommendation(s): Severely reduced LVEF with global  hypokinesis.  While LV measurements are within normal limits, appearance of  mitral valve suggests mitral annular dilation secondary to LV dilation.  Elevated LVEDP and elevated right sided pressures, with moderate bordering on severe pulmonary hypertension.   Patient Profile     49 y.o. male with a history of hypertension and medication non-compliance who is being seen for evaluation of hypertensive emergency and hypertensive cardiomyopathy at the request of Dr. Sabino Gasser.  Assessment & Plan    Hypertensive Cardiomyopathy - Echo results above, EF 25-30%, grade 3 dd, elevated LVEDP, mod MR ?functional, PASP elevated, RA pressure 15 mmHg - some volume overload on exam, but wt is decreasing w/out Lasix - all rx is new, now on hydralazine 100 mg tid, isordil 40 mg tid - Coreg 6.25 mg bid added this am - no cath for eval due to poor renal function - no ACE/ARB/ARNI or spiro  Hypertensive Emergency - BP up to 240/185 on admit, AKI w/ Cr > 4.5 - BP still very high on hydralazine 100 mg tid, isordil 40 mg tid and amlodipine 10 mg qd - Coreg 6.25 mg bid added this am - BP 195/125 & 164/113 last 2 readings - med titration per MD, Nephrology considering amiloride - plasma catecholamines drawn, urine catecholamines pending - w/ decreased EF and BP still very high, MD advise if we should stop amlodipine  Demand Ischemia - trop mildly elevated w/ downward trend - ECG w/ LVH, no clear ischemic changes - no cath 2nd renal function - trop elevation felt 2nd demand ischemia  AKI - no labs in > 1 yr - no known hx CKD - Cr 4.55 on admit and continues to trend up, 4.93 today - medical renal dz on [place] - Nephrology managing, mult labs ordered - w/ hypokalemia, ?Liddle's syndrome  For questions or updates, please contact [place] Please consult [email or web address] for contact info under        Signed, Rosaria Ferries, PA-C  05/12/2020, 8:39 AM

## 2020-05-12 NOTE — Progress Notes (Signed)
24hr urine ended at 1545 - specimen collected and delivered to lab.

## 2020-05-13 ENCOUNTER — Inpatient Hospital Stay (HOSPITAL_COMMUNITY): Payer: Self-pay

## 2020-05-13 DIAGNOSIS — E876 Hypokalemia: Secondary | ICD-10-CM

## 2020-05-13 DIAGNOSIS — I1310 Hypertensive heart and chronic kidney disease without heart failure, with stage 1 through stage 4 chronic kidney disease, or unspecified chronic kidney disease: Secondary | ICD-10-CM

## 2020-05-13 DIAGNOSIS — I132 Hypertensive heart and chronic kidney disease with heart failure and with stage 5 chronic kidney disease, or end stage renal disease: Secondary | ICD-10-CM

## 2020-05-13 LAB — PHOSPHORUS: Phosphorus: 4.2 mg/dL (ref 2.5–4.6)

## 2020-05-13 LAB — CBC WITH DIFFERENTIAL/PLATELET
Abs Immature Granulocytes: 0.1 10*3/uL — ABNORMAL HIGH (ref 0.00–0.07)
Basophils Absolute: 0 10*3/uL (ref 0.0–0.1)
Basophils Relative: 0 %
Eosinophils Absolute: 0.3 10*3/uL (ref 0.0–0.5)
Eosinophils Relative: 3 %
HCT: 35.8 % — ABNORMAL LOW (ref 39.0–52.0)
Hemoglobin: 11.3 g/dL — ABNORMAL LOW (ref 13.0–17.0)
Immature Granulocytes: 1 %
Lymphocytes Relative: 10 %
Lymphs Abs: 1.3 10*3/uL (ref 0.7–4.0)
MCH: 26.2 pg (ref 26.0–34.0)
MCHC: 31.6 g/dL (ref 30.0–36.0)
MCV: 82.9 fL (ref 80.0–100.0)
Monocytes Absolute: 0.8 10*3/uL (ref 0.1–1.0)
Monocytes Relative: 7 %
Neutro Abs: 9.7 10*3/uL — ABNORMAL HIGH (ref 1.7–7.7)
Neutrophils Relative %: 79 %
Platelets: 247 10*3/uL (ref 150–400)
RBC: 4.32 MIL/uL (ref 4.22–5.81)
RDW: 14.4 % (ref 11.5–15.5)
WBC: 12.2 10*3/uL — ABNORMAL HIGH (ref 4.0–10.5)
nRBC: 0 % (ref 0.0–0.2)

## 2020-05-13 LAB — COMPREHENSIVE METABOLIC PANEL
ALT: 24 U/L (ref 0–44)
AST: 18 U/L (ref 15–41)
Albumin: 3.3 g/dL — ABNORMAL LOW (ref 3.5–5.0)
Alkaline Phosphatase: 55 U/L (ref 38–126)
Anion gap: 10 (ref 5–15)
BUN: 48 mg/dL — ABNORMAL HIGH (ref 6–20)
CO2: 23 mmol/L (ref 22–32)
Calcium: 9.4 mg/dL (ref 8.9–10.3)
Chloride: 102 mmol/L (ref 98–111)
Creatinine, Ser: 5.03 mg/dL — ABNORMAL HIGH (ref 0.61–1.24)
GFR calc Af Amer: 14 mL/min — ABNORMAL LOW (ref >60)
GFR calc non Af Amer: 12 mL/min — ABNORMAL LOW (ref >60)
Glucose, Bld: 121 mg/dL — ABNORMAL HIGH (ref 70–99)
Potassium: 3.8 mmol/L (ref 3.5–5.1)
Sodium: 135 mmol/L (ref 135–145)
Total Bilirubin: 1.3 mg/dL — ABNORMAL HIGH (ref 0.3–1.2)
Total Protein: 6.8 g/dL (ref 6.5–8.1)

## 2020-05-13 LAB — T3, FREE: T3, Free: 2.7 pg/mL (ref 2.0–4.4)

## 2020-05-13 LAB — MAGNESIUM: Magnesium: 2.3 mg/dL (ref 1.7–2.4)

## 2020-05-13 MED ORDER — CARVEDILOL 12.5 MG PO TABS
12.5000 mg | ORAL_TABLET | Freq: Two times a day (BID) | ORAL | Status: DC
  Administered 2020-05-13 – 2020-05-15 (×5): 12.5 mg via ORAL
  Filled 2020-05-13 (×5): qty 1

## 2020-05-13 MED ORDER — CARVEDILOL 12.5 MG PO TABS: 12.5000 mg | ORAL_TABLET | Freq: Two times a day (BID) | ORAL | Status: DC

## 2020-05-13 NOTE — Progress Notes (Signed)
Patient ID: Bobby Stevens, male   DOB: Jun 23, 1971, 49 y.o.   MRN: [id number]  PROGRESS NOTE    Bobby Stevens  XJO:[id number] DOB: 11/19/1970 DOA: 05/09/2020 PCP: System, Pcp Not In   Brief Narrative:  49 year old male with history of uncontrolled hypertension, noncompliance to medications due to financial constraints presented with weakness and lightheadedness.  On presentation, his blood pressure was 246/181 initially.  He was found to have BUN of 57 with creatinine of 4.55.  Echo showed EF of 25 to 30% with global hypokinesis of the right ventricle with LVH along with grade 3 diastolic dysfunction.  Cardiology and nephrology were consulted.  Assessment & Plan:   Hypertensive emergency -Presented with blood pressure of 246/181 initially. -Blood pressure still elevated.  Currently on amlodipine, Coreg, hydralazine, isosorbide dinitrate and spironolactone.  Cardiology and nephrology following.  Work-up for secondary hypertension including pheochromocytoma and hyperaldosteronism are ongoing per nephrology -Continue monitoring blood pressure  Acute kidney injury/hypertensive nephropathy -Nephrology following.  Creatinine trending upwards, 5.03 today.   -Unfortunately, unable to do renal biopsy due to uncontrolled blood pressure -Monitor creatinine  Acute hypertensive cardiomyopathy/acute systolic and diastolic heart failure -Echo showed both systolic and diastolic heart failure with grade 3 diastolic dysfunction with mild LVH with EF of 25 to 30% with global hypokinesis of the left ventricle -Strict input and output.  Daily weights.  Fluid restriction.  Renal function precludes cardiac cath for now. -Continue antihypertensives as above.  Cardiology following  Leukocytosis -Probably reactive.  Improving  Hypokalemia -Improved  Normocytic anemia -Multifactorial due to vitamin B12 deficiency, iron deficiency and anemia of chronic disease -Continue oral iron supplementation and oral  vitamin B12   DVT prophylaxis: Heparin subcutaneous Code Status: Full Family Communication: Patient at bedside Disposition Plan: Status is: Inpatient  Remains inpatient appropriate because:Inpatient level of care appropriate due to severity of illness   Dispo: The patient is from: Home              Anticipated d/c is to: Home              Anticipated d/c date is: > 3 days              Patient currently is not medically stable to d/c.  Consultants: Cardiology/nephrology  Procedures:  Echo 1. Left ventricular ejection fraction, by estimation, is 25 to 30%. The  left ventricle has severely decreased function. The left ventricle  demonstrates global hypokinesis. There is moderate concentric left  ventricular hypertrophy. Left ventricular  diastolic parameters are consistent with Grade III diastolic dysfunction  (restrictive). Elevated left ventricular end-diastolic pressure.  2. Right ventricular systolic function is mildly reduced. The right  ventricular size is normal. There is moderately elevated pulmonary artery  systolic pressure.  3. Left atrial size was moderately dilated.  4. Right atrial size was mildly dilated.  5. The mitral valve is normal in structure. Mild to moderate mitral valve  regurgitation.  6. Tricuspid valve regurgitation is moderate.  7. The aortic valve is grossly normal. Aortic valve regurgitation is not  visualized. No aortic stenosis is present.  8. Aortic dilatation noted. There is mild to moderate dilatation of the  ascending aorta measuring 43 mm.  9. The inferior vena cava is dilated in size with <50% respiratory  variability, suggesting right atrial pressure of 15 mmHg.   Antimicrobials: None   Subjective: Patient seen and examined at bedside.  Denies any overnight chest pain, worsening shortness of breath, fever or vomiting.  Objective: Vitals:  05/12/20 2026 05/12/20 2032 05/13/20 0501 05/13/20 0733  BP: (!) 141/98  (!) 166/109    Pulse: 90  95 97  Resp:   19 19  Temp: 99.3 F (37.4 C)  99.3 F (37.4 C) 98.8 F (37.1 C)  TempSrc: Oral  Oral Oral  SpO2:  99% 100% 100%  Weight:   82.1 kg   Height:        Intake/Output Summary (Last 24 hours) at 05/13/2020 1040 Last data filed at 05/12/2020 2100 Gross per 24 hour  Intake 1055 ml  Output 700 ml  Net 355 ml   Filed Weights   05/11/20 0429 05/12/20 0629 05/13/20 0501  Weight: 82.2 kg 82.3 kg 82.1 kg    Examination:  General exam: Appears calm and comfortable  Respiratory system: Bilateral decreased breath sounds at bases with scattered crackles Cardiovascular system: S1 & S2 heard, Rate controlled Gastrointestinal system: Abdomen is nondistended, soft and nontender. Normal bowel sounds heard. Extremities: No cyanosis, clubbing; trace lower extremity edema present    Data Reviewed: I have personally reviewed following labs and imaging studies  CBC: Recent Labs  Lab 05/09/20 1240 05/10/20 0507 05/11/20 0342 05/12/20 0354 05/13/20 0901  WBC 15.0* 12.2* 14.9* 13.9* 12.2*  NEUTROABS  --   --  12.4* 11.6* 9.7*  HGB 13.6 12.0* 12.0* 10.7* 11.3*  HCT 41.3 36.6* 37.7* 33.4* 35.8*  MCV 80.2 81.3 81.8 82.9 82.9  PLT 272 228 258 212 573   Basic Metabolic Panel: Recent Labs  Lab 05/09/20 1240 05/10/20 0507 05/10/20 0807 05/11/20 0342 05/12/20 0354 05/13/20 0901  NA 134* 136  --  138 135 135  K 2.9* 2.8*  --  3.7 3.7 3.8  CL 98 102  --  105 103 102  CO2 22 25  --  20* 21* 23  GLUCOSE 133* 107*  --  107* 120* 121*  BUN 57* 53*  --  56* 53* 48*  CREATININE 4.55* 4.61*  --  4.80* 4.93* 5.03*  CALCIUM 9.0 8.8*  --  8.9 8.7* 9.4  MG  --   --  2.1 2.3 2.1 2.3  PHOS  --   --   --  3.8 4.4 4.2   GFR: Estimated Creatinine Clearance: 20.1 mL/min (A) (by C-G formula based on SCr of 5.03 mg/dL (H)). Liver Function Tests: Recent Labs  Lab 05/10/20 0807 05/11/20 0342 05/12/20 0354 05/13/20 0901  AST 31 17 16 18   ALT 26 23 20 24   ALKPHOS 59 64  52 55  BILITOT 1.3* 1.2 1.6* 1.3*  PROT 6.2* 6.4* 6.0* 6.8  ALBUMIN 3.1* 3.4* 3.0* 3.3*   No results for input(s): LIPASE, AMYLASE in the last 168 hours. No results for input(s): AMMONIA in the last 168 hours. Coagulation Profile: No results for input(s): INR, PROTIME in the last 168 hours. Cardiac Enzymes: Recent Labs  Lab 05/10/20 1300  CKTOTAL 128   BNP (last 3 results) No results for input(s): PROBNP in the last 8760 hours. HbA1C: No results for input(s): HGBA1C in the last 72 hours. CBG: No results for input(s): GLUCAP in the last 168 hours. Lipid Profile: No results for input(s): CHOL, HDL, LDLCALC, TRIG, CHOLHDL, LDLDIRECT in the last 72 hours. Thyroid Function Tests: Recent Labs    05/12/20 0354  TSH 2.067  FREET4 1.18*  T3FREE 2.7   Anemia Panel: Recent Labs    05/10/20 1632  VITAMINB12 328  FOLATE 16.5  FERRITIN 118  TIBC 332  IRON 29*   Sepsis Labs: No  results for input(s): PROCALCITON, LATICACIDVEN in the last 168 hours.  Recent Results (from the past 240 hour(s))  SARS Coronavirus 2 by RT PCR (hospital order, performed in [facility] hospital lab) Nasopharyngeal Nasopharyngeal Swab     Status: None   Collection Time: 05/09/20  2:59 PM   Specimen: Nasopharyngeal Swab  Result Value Ref Range Status   SARS Coronavirus 2 NEGATIVE NEGATIVE Final    Comment: (NOTE) SARS-CoV-2 target nucleic acids are NOT DETECTED.  The SARS-CoV-2 RNA is generally detectable in upper and lower respiratory specimens during the acute phase of infection. The lowest concentration of SARS-CoV-2 viral copies this assay can detect is 250 copies / mL. A negative result does not preclude SARS-CoV-2 infection and should not be used as the sole basis for treatment or other patient management decisions.  A negative result may occur with improper specimen collection / handling, submission of specimen other than nasopharyngeal swab, presence of viral mutation(s) within the areas  targeted by this assay, and inadequate number of viral copies (<250 copies / mL). A negative result must be combined with clinical observations, patient history, and epidemiological information.  Fact Sheet for Patients:   StrictlyIdeas.no  Fact Sheet for Healthcare Providers: BankingDealers.co.za  This test is not yet approved or  cleared by the [place] FDA and has been authorized for detection and/or diagnosis of SARS-CoV-2 by FDA under an Emergency Use Authorization (EUA).  This EUA will remain in effect (meaning this test can be used) for the duration of the COVID-19 declaration under Section 564(b)(1) of the Act, 21 U.S.C. section 360bbb-3(b)(1), unless the authorization is terminated or revoked sooner.  Performed at [facility], [address] [place], [place] [address]          Radiology Studies: VAS US RENAL ARTERY DUPLEX  Result Date: 05/13/2020 ABDOMINAL VISCERAL Indications: Hypertension and kidney disease. Patient stated that he has not              taken his meds for months. High Risk Factors: Hypertension. Performing Technologist: Oda Cogan RDMS, RVT  Examination Guidelines: A complete evaluation includes B-mode imaging, spectral Doppler, color Doppler, and power Doppler as needed of all accessible portions of each vessel. Bilateral testing is considered an integral part of a complete examination. Limited examinations for reoccurring indications may be performed as noted.  Duplex Findings: +--------------------+--------+--------+------+--------+ Mesenteric          PSV cm/sEDV cm/sPlaqueComments +--------------------+--------+--------+------+--------+ Aorta Mid              71                          +--------------------+--------+--------+------+--------+ Celiac Artery Origin  140                          +--------------------+--------+--------+------+--------+ SMA Origin             194                          +--------------------+--------+--------+------+--------+    +------------------+--------+--------+-------+ Right Renal ArteryPSV cm/sEDV cm/sComment +------------------+--------+--------+-------+ Origin               35      13           +------------------+--------+--------+-------+ Proximal             36      7            +------------------+--------+--------+-------+  Mid                  36      16           +------------------+--------+--------+-------+ Distal               43      19           +------------------+--------+--------+-------+ +-----------------+--------+--------+-------+ Left Renal ArteryPSV cm/sEDV cm/sComment +-----------------+--------+--------+-------+ Origin              43      9            +-----------------+--------+--------+-------+ Proximal            38      10           +-----------------+--------+--------+-------+ Mid                 28      4            +-----------------+--------+--------+-------+ Distal              34      8            +-----------------+--------+--------+-------+  Technologist observations: Kidneys - increased cortical echogenicity noted bilaterally with very pulsatile flow throughout the renal vein making it difficult to evaluate the renal arteries. +------------+--------+--------+----+-----------+--------+--------+----+ Right KidneyPSV cm/sEDV cm/sRI  Left KidneyPSV cm/sEDV cm/sRI   +------------+--------+--------+----+-----------+--------+--------+----+ Upper Pole  21      6       0.71Upper Pole 12      6       0.53 +------------+--------+--------+----+-----------+--------+--------+----+ Mid         20      3       0.85Mid        15      5       0.66 +------------+--------+--------+----+-----------+--------+--------+----+ Lower Pole  16      3       0.78Lower Pole 19      7       0.64  +------------+--------+--------+----+-----------+--------+--------+----+ Hilar       21      7       0.67Hilar                           +------------+--------+--------+----+-----------+--------+--------+----+ +------------------+-----+------------------+-----+ Right Kidney           Left Kidney             +------------------+-----+------------------+-----+ RAR                    RAR                     +------------------+-----+------------------+-----+ RAR (manual)           RAR (manual)            +------------------+-----+------------------+-----+ Cortex                 Cortex                  +------------------+-----+------------------+-----+ Cortex thickness       Corex thickness         +------------------+-----+------------------+-----+ Kidney length (cm)10.90Kidney length (cm)10.60 +------------------+-----+------------------+-----+  Summary: Renal:  Right: No evidence of right renal artery stenosis. Abnormal right        Resistive Index. Normal size right kidney. Left:  No evidence of left renal artery stenosis.  Normal left        Resistive Index. Normal size of left kidney. Mesenteric: Areas of limited visceral study include right parenchymal flow and left parenchymal flow.  *See table(s) above for measurements and observations.     Preliminary         Scheduled Meds: . amLODipine  10 mg Oral Daily  . carvedilol  12.5 mg Oral BID WC  . ferrous sulfate  325 mg Oral Q breakfast  . heparin  5,000 Units Subcutaneous Q8H  . hydrALAZINE  100 mg Oral TID  . isosorbide dinitrate  40 mg Oral TID  . spironolactone  25 mg Oral Daily  . vitamin B-12  1,000 mcg Oral Daily   Continuous Infusions:        Aline August, MD [facility] 05/13/2020, 10:40 AM

## 2020-05-13 NOTE — Progress Notes (Signed)
  Mobility Specialist Criteria Algorithm Info.  Mobility Team:  HOB elevated: Activity: Ambulated in hall;Ambulated to bathroom (In chair before and after ambulation) Range of motion: Active;All extremities Level of assistance: Independent Assistive device: None Minutes sitting in chair:  Minutes stood: 10 minutes Minutes ambulated: 10 minutes Distance ambulated (ft): 2500 ft Mobility response: Tolerated well Bed Position: Chair (Recliner chair)  Pt tolerated ambulation well with no complaints.   05/13/2020 3:10 PM'

## 2020-05-13 NOTE — Progress Notes (Signed)
Admit: 05/09/2020 LOS: 2  56M with HTN emergency, AKI with nephrotic proteinuria  Subjective:  . NAEO . BPs elevated stable . Started MRB yesterday . Renal doppler this AM pending . Stable SCr, K 3.8 . ANCA negative  07/25 0701 - 07/26 0700 In: 1065 [P.O.:1065] Out: 500 [Urine:500]  Filed Weights   05/09/20 1850 05/10/20 0233 05/11/20 0429  Weight: 83.8 kg 82.9 kg 82.2 kg    Scheduled Meds: . amLODipine  10 mg Oral Daily  . [START ON 05/12/2020] carvedilol  6.25 mg Oral BID WC  . ferrous sulfate  325 mg Oral Q breakfast  . heparin  5,000 Units Subcutaneous Q8H  . hydrALAZINE  100 mg Oral TID  . isosorbide dinitrate  40 mg Oral TID  . vitamin B-12  1,000 mcg Oral Daily   Continuous Infusions: PRN Meds:.acetaminophen **OR** acetaminophen, labetalol, ondansetron **OR** ondansetron (ZOFRAN) IV  Current Labs: reviewed  Results for ZAVIYAR, RAHAL (MRN [id number]) as of 05/11/2020 15:14  Ref. Range 05/09/2020 10:56  Protein Creatinine Ratio Latest Ref Range: 0.00 - 0.15 mg/mgCre 7.71 (H)   NEGATIVE: HIV, HBV, HCV, ANA, dsDNA, ANCA, SPEP, SFLC PENDING: Renin/Aldo levels   Physical Exam:  Blood pressure (!) 179/122, pulse 101, temperature 98.5 F (36.9 C), temperature source Oral, resp. rate 18, height 6\' 1"  (1.854 m), weight 82.2 kg, SpO2 98 %. NAD, well-appearing Regular, normal S1 and S2 CTA B No peripheral edema Soft, nontender No rashes or lesions Nonfocal, CN II through XII intact  A 1. Renal failure, unclear chronicity, UPC with nephrotic range proteinuria 2. Hypertensive emergency, suspect subacute or chronic uncontrolled severe hypertension 3. Systolic heart failure, chronic; hypertensive heart disease 4. Severe hypokalemia  P . Inc carvedilol today, uptitrate to max dose as needed . Cont spironolactone 25 mg/d, trend potassium . Next would add on further loop diuretic and consider use of minoxidil if blood pressure remains elevated . Follow-up outstanding  labs: PRA and Aldo levels, pheochromocytoma eval . Currently not feasible to do a biopsy, but this might require following up as an outpatient . Daily weights, Daily Renal Panel, Strict I/Os, Avoid nephrotoxins (NSAIDs, judicious IV Contrast)   Pearson Grippe MD 05/11/2020, 3:13 PM  Recent Labs  Lab 05/09/20 1240 05/10/20 0507 05/11/20 0342  NA 134* 136 138  K 2.9* 2.8* 3.7  CL 98 102 105  CO2 22 25 20*  GLUCOSE 133* 107* 107*  BUN 57* 53* 56*  CREATININE 4.55* 4.61* 4.80*  CALCIUM 9.0 8.8* 8.9  PHOS  --   --  3.8   Recent Labs  Lab 05/09/20 1240 05/10/20 0507 05/11/20 0342  WBC 15.0* 12.2* 14.9*  NEUTROABS  --   --  12.4*  HGB 13.6 12.0* 12.0*  HCT 41.3 36.6* 37.7*  MCV 80.2 81.3 81.8  PLT 272 228 258

## 2020-05-13 NOTE — Progress Notes (Signed)
Progress Note  Patient Name: Bobby Stevens Date of Encounter: 05/13/2020  [facility] Cardiologist: Buford Dresser, MD   Subjective   Feeling well today, no CP or SOB  Inpatient Medications    Scheduled Meds: . amLODipine  10 mg Oral Daily  . carvedilol  12.5 mg Oral BID WC  . ferrous sulfate  325 mg Oral Q breakfast  . heparin  5,000 Units Subcutaneous Q8H  . hydrALAZINE  100 mg Oral TID  . isosorbide dinitrate  40 mg Oral TID  . spironolactone  25 mg Oral Daily  . vitamin B-12  1,000 mcg Oral Daily   Continuous Infusions:  PRN Meds: acetaminophen **OR** acetaminophen, labetalol, ondansetron **OR** ondansetron (ZOFRAN) IV   Vital Signs    Vitals:   05/12/20 2026 05/12/20 2032 05/13/20 0501 05/13/20 0733  BP: (!) 141/98  (!) 166/109   Pulse: 90  95 97  Resp:   19 19  Temp: 99.3 F (37.4 C)  99.3 F (37.4 C) 98.8 F (37.1 C)  TempSrc: Oral  Oral Oral  SpO2:  99% 100% 100%  Weight:   82.1 kg   Height:        Intake/Output Summary (Last 24 hours) at 05/13/2020 0944 Last data filed at 05/12/2020 2100 Gross per 24 hour  Intake 1055 ml  Output 700 ml  Net 355 ml   Last 3 Weights 05/13/2020 05/12/2020 05/11/2020  Weight (lbs) 181 lb 181 lb 6.4 oz 181 lb 3.2 oz  Weight (kg) 82.101 kg 82.283 kg 82.192 kg      Telemetry    SR, ST - Personally Reviewed  ECG    No new ECG tracing today. - Personally Reviewed  Physical Exam   GEN: No acute distress.   Neck: No JVD seen, improved Cardiac: RRR, no murmur, no rubs, or gallops.  Respiratory: clear bilaterally   GI: Soft, nontender, non-distended  MS: No edema; No deformity. Neuro:  Nonfocal  Psych: Normal affect   Labs    High Sensitivity Troponin:   Recent Labs  Lab 05/09/20 1240 05/09/20 1438 05/10/20 0807 05/11/20 0530  TROPONINIHS 105* 115* 79* 67*      Chemistry Recent Labs  Lab 05/10/20 0507 05/10/20 0807 05/11/20 0342 05/12/20 0354  NA 136  --  138 135  K 2.8*  --  3.7  3.7  CL 102  --  105 103  CO2 25  --  20* 21*  GLUCOSE 107*  --  107* 120*  BUN 53*  --  56* 53*  CREATININE 4.61*  --  4.80* 4.93*  CALCIUM 8.8*  --  8.9 8.7*  PROT  --  6.2* 6.4* 6.0*  ALBUMIN  --  3.1* 3.4* 3.0*  AST  --  31 17 16   ALT  --  26 23 20   ALKPHOS  --  59 64 52  BILITOT  --  1.3* 1.2 1.6*  GFRNONAA 14*  --  13* 13*  GFRAA 16*  --  15* 15*  ANIONGAP 9  --  13 11     Hematology Recent Labs  Lab 05/11/20 0342 05/12/20 0354 05/13/20 0901  WBC 14.9* 13.9* 12.2*  RBC 4.61 4.03* 4.32  HGB 12.0* 10.7* 11.3*  HCT 37.7* 33.4* 35.8*  MCV 81.8 82.9 82.9  MCH 26.0 26.6 26.2  MCHC 31.8 32.0 31.6  RDW 14.1 14.2 14.4  PLT 258 212 247    BNP Recent Labs  Lab 05/09/20 1240 05/10/20 0807  BNP 2,048.3* 1,827.0*  DDimer  Recent Labs  Lab 05/09/20 1240  DDIMER 0.87*     Radiology    VAS US RENAL ARTERY DUPLEX  Result Date: 05/13/2020 ABDOMINAL VISCERAL Indications: Hypertension and kidney disease. Patient stated that he has not              taken his meds for months. High Risk Factors: Hypertension. Performing Technologist: Oda Cogan RDMS, RVT  Examination Guidelines: A complete evaluation includes B-mode imaging, spectral Doppler, color Doppler, and power Doppler as needed of all accessible portions of each vessel. Bilateral testing is considered an integral part of a complete examination. Limited examinations for reoccurring indications may be performed as noted.  Duplex Findings: +--------------------+--------+--------+------+--------+ Mesenteric          PSV cm/sEDV cm/sPlaqueComments +--------------------+--------+--------+------+--------+ Aorta Mid              71                          +--------------------+--------+--------+------+--------+ Celiac Artery Origin  140                          +--------------------+--------+--------+------+--------+ SMA Origin            194                           +--------------------+--------+--------+------+--------+    +------------------+--------+--------+-------+ Right Renal ArteryPSV cm/sEDV cm/sComment +------------------+--------+--------+-------+ Origin               35      13           +------------------+--------+--------+-------+ Proximal             36      7            +------------------+--------+--------+-------+ Mid                  36      16           +------------------+--------+--------+-------+ Distal               43      19           +------------------+--------+--------+-------+ +-----------------+--------+--------+-------+ Left Renal ArteryPSV cm/sEDV cm/sComment +-----------------+--------+--------+-------+ Origin              43      9            +-----------------+--------+--------+-------+ Proximal            38      10           +-----------------+--------+--------+-------+ Mid                 28      4            +-----------------+--------+--------+-------+ Distal              34      8            +-----------------+--------+--------+-------+  Technologist observations: Kidneys - increased cortical echogenicity noted bilaterally with very pulsatile flow throughout the renal vein making it difficult to evaluate the renal arteries. +------------+--------+--------+----+-----------+--------+--------+----+ Right KidneyPSV cm/sEDV cm/sRI  Left KidneyPSV cm/sEDV cm/sRI   +------------+--------+--------+----+-----------+--------+--------+----+ Upper Pole  21      6       0.71Upper Pole 12      6  0.53 +------------+--------+--------+----+-----------+--------+--------+----+ Mid         20      3       0.85Mid        15      5       0.66 +------------+--------+--------+----+-----------+--------+--------+----+ Lower Pole  16      3       0.78Lower Pole 19      7       0.64 +------------+--------+--------+----+-----------+--------+--------+----+ Hilar       21       7       0.67Hilar                           +------------+--------+--------+----+-----------+--------+--------+----+ +------------------+-----+------------------+-----+ Right Kidney           Left Kidney             +------------------+-----+------------------+-----+ RAR                    RAR                     +------------------+-----+------------------+-----+ RAR (manual)           RAR (manual)            +------------------+-----+------------------+-----+ Cortex                 Cortex                  +------------------+-----+------------------+-----+ Cortex thickness       Corex thickness         +------------------+-----+------------------+-----+ Kidney length (cm)10.90Kidney length (cm)10.60 +------------------+-----+------------------+-----+  Summary: Renal:  Right: No evidence of right renal artery stenosis. Abnormal right        Resistive Index. Normal size right kidney. Left:  No evidence of left renal artery stenosis. Normal left        Resistive Index. Normal size of left kidney. Mesenteric: Areas of limited visceral study include right parenchymal flow and left parenchymal flow.  *See table(s) above for measurements and observations.     Preliminary     Cardiac Studies   Echocardiogram 05/10/2020: Impressions: 1. Left ventricular ejection fraction, by estimation, is 25 to 30%. The  left ventricle has severely decreased function. The left ventricle  demonstrates global hypokinesis. There is moderate concentric left  ventricular hypertrophy. Left ventricular  diastolic parameters are consistent with Grade III diastolic dysfunction  (restrictive). Elevated left ventricular end-diastolic pressure.  2. Right ventricular systolic function is mildly reduced. The right  ventricular size is normal. There is moderately elevated pulmonary artery  systolic pressure.  3. Left atrial size was moderately dilated.  4. Right atrial size was mildly dilated.    5. The mitral valve is normal in structure. Mild to moderate mitral valve  regurgitation.  6. Tricuspid valve regurgitation is moderate.  7. The aortic valve is grossly normal. Aortic valve regurgitation is not  visualized. No aortic stenosis is present.  8. Aortic dilatation noted. There is mild to moderate dilatation of the  ascending aorta measuring 43 mm.  9. The inferior vena cava is dilated in size with <50% respiratory  variability, suggesting right atrial pressure of 15 mmHg.   Comparison(s): No prior Echocardiogram.   Conclusion(s)/Recommendation(s): Severely reduced LVEF with global  hypokinesis. While LV measurements are within normal limits, appearance of  mitral valve suggests mitral annular dilation secondary to LV dilation.  Elevated LVEDP and elevated right sided pressures,  with moderate bordering on severe pulmonary hypertension.   Patient Profile     49 y.o. male with a history of hypertension and medication non-compliance who is being seen for evaluation of hypertensive emergency and hypertensive cardiomyopathy at the request of Dr. Sabino Gasser.  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

## 2020-05-13 NOTE — Progress Notes (Signed)
During patient rounds - patient expressed some "blurry vision out of right eye." Patient stated it has been this way off and on since admission, even occurred prior to admission. Patient stated "I can see out of it, but its like a small hazy in the center." Dr. Starla Link paged via [facility] regarding the above. Waiting return call. No other symptoms reported, VSS. Will continue to monitor closely.

## 2020-05-14 LAB — COMPREHENSIVE METABOLIC PANEL
ALT: 22 U/L (ref 0–44)
AST: 18 U/L (ref 15–41)
Albumin: 2.9 g/dL — ABNORMAL LOW (ref 3.5–5.0)
Alkaline Phosphatase: 57 U/L (ref 38–126)
Anion gap: 10 (ref 5–15)
BUN: 61 mg/dL — ABNORMAL HIGH (ref 6–20)
CO2: 23 mmol/L (ref 22–32)
Calcium: 8.8 mg/dL — ABNORMAL LOW (ref 8.9–10.3)
Chloride: 102 mmol/L (ref 98–111)
Creatinine, Ser: 5.13 mg/dL — ABNORMAL HIGH (ref 0.61–1.24)
GFR calc Af Amer: 14 mL/min — ABNORMAL LOW (ref >60)
GFR calc non Af Amer: 12 mL/min — ABNORMAL LOW (ref >60)
Glucose, Bld: 124 mg/dL — ABNORMAL HIGH (ref 70–99)
Potassium: 3.9 mmol/L (ref 3.5–5.1)
Sodium: 135 mmol/L (ref 135–145)
Total Bilirubin: 1.3 mg/dL — ABNORMAL HIGH (ref 0.3–1.2)
Total Protein: 5.8 g/dL — ABNORMAL LOW (ref 6.5–8.1)

## 2020-05-14 LAB — METANEPHRINES, URINE, 24 HOUR
Metaneph Total, Ur: 143 ug/L
Metanephrines, 24H Ur: 272 ug/24 hr (ref 58–276)
Normetanephrine, 24H Ur: 1077 ug/24 hr — ABNORMAL HIGH (ref 156–729)
Normetanephrine, Ur: 567 ug/L
Total Volume: 1900

## 2020-05-14 LAB — CBC WITH DIFFERENTIAL/PLATELET
Abs Immature Granulocytes: 0.09 10*3/uL — ABNORMAL HIGH (ref 0.00–0.07)
Basophils Absolute: 0 10*3/uL (ref 0.0–0.1)
Basophils Relative: 0 %
Eosinophils Absolute: 0.4 10*3/uL (ref 0.0–0.5)
Eosinophils Relative: 4 %
HCT: 31.3 % — ABNORMAL LOW (ref 39.0–52.0)
Hemoglobin: 9.9 g/dL — ABNORMAL LOW (ref 13.0–17.0)
Immature Granulocytes: 1 %
Lymphocytes Relative: 13 %
Lymphs Abs: 1.5 10*3/uL (ref 0.7–4.0)
MCH: 26.3 pg (ref 26.0–34.0)
MCHC: 31.6 g/dL (ref 30.0–36.0)
MCV: 83 fL (ref 80.0–100.0)
Monocytes Absolute: 0.9 10*3/uL (ref 0.1–1.0)
Monocytes Relative: 8 %
Neutro Abs: 8.6 10*3/uL — ABNORMAL HIGH (ref 1.7–7.7)
Neutrophils Relative %: 74 %
Platelets: 224 10*3/uL (ref 150–400)
RBC: 3.77 MIL/uL — ABNORMAL LOW (ref 4.22–5.81)
RDW: 14.2 % (ref 11.5–15.5)
WBC: 11.6 10*3/uL — ABNORMAL HIGH (ref 4.0–10.5)
nRBC: 0 % (ref 0.0–0.2)

## 2020-05-14 LAB — PHOSPHORUS: Phosphorus: 3.9 mg/dL (ref 2.5–4.6)

## 2020-05-14 LAB — CATECHOLAMINES,UR.,FREE,24 HR
Dopamine, Rand Ur: 26 ug/L
Dopamine, Ur, 24Hr: 49 ug/24 hr (ref 0–510)
Epinephrine, Rand Ur: 3 ug/L
Epinephrine, U, 24Hr: 6 ug/24 hr (ref 0–20)
Norepinephrine, Rand Ur: 26 ug/L
Norepinephrine,U,24H: 49 ug/24 hr (ref 0–135)
Total Volume: 1900

## 2020-05-14 LAB — MAGNESIUM: Magnesium: 2.2 mg/dL (ref 1.7–2.4)

## 2020-05-14 NOTE — Progress Notes (Signed)
Patient ID: Bobby Stevens, male   DOB: 17-Sep-1971, 49 y.o.   MRN: [id number]  PROGRESS NOTE    Henderson Frampton  WUG:[id number] DOB: October 15, 1971 DOA: 05/09/2020 PCP: System, Pcp Not In   Brief Narrative:  49 year old male with history of uncontrolled hypertension, noncompliance to medications due to financial constraints presented with weakness and lightheadedness.  On presentation, his blood pressure was 246/181 initially.  He was found to have BUN of 57 with creatinine of 4.55.  Echo showed EF of 25 to 30% with global hypokinesis of the right ventricle with LVH along with grade 3 diastolic dysfunction.  Cardiology and nephrology were consulted.  Assessment & Plan:   Hypertensive emergency -Presented with blood pressure of 246/181 initially. -Blood pressure still elevated.  Currently on amlodipine, Coreg, hydralazine, isosorbide dinitrate and spironolactone.  Cardiology and nephrology following and adjusting the doses of antihypertensives.  Work-up for secondary hypertension including pheochromocytoma and hyperaldosteronism are ongoing per nephrology -Continue monitoring blood pressure  Acute kidney injury/hypertensive nephropathy -Nephrology following.  Creatinine trending upwards, 5.13 today.   -Unfortunately, unable to do renal biopsy due to uncontrolled blood pressure -Monitor creatinine  Acute hypertensive cardiomyopathy/acute systolic and diastolic heart failure -Echo showed both systolic and diastolic heart failure with grade 3 diastolic dysfunction with mild LVH with EF of 25 to 30% with global hypokinesis of the left ventricle -Strict input and output.  Daily weights.  Fluid restriction.  Renal function precludes cardiac cath for now. -Continue antihypertensives as above.  Cardiology following  Leukocytosis -Probably reactive.  Improving  Hypokalemia -Improved  Normocytic anemia -Multifactorial due to vitamin B12 deficiency, iron deficiency and anemia of chronic  disease -Continue oral iron supplementation and oral vitamin B12   DVT prophylaxis: Heparin subcutaneous Code Status: Full Family Communication: Patient at bedside Disposition Plan: Status is: Inpatient  Remains inpatient appropriate because:Inpatient level of care appropriate due to severity of illness   Dispo: The patient is from: Home              Anticipated d/c is to: Home              Anticipated d/c date is: > 3 days              Patient currently is not medically stable to d/c.  Consultants: Cardiology/nephrology  Procedures:  Echo 1. Left ventricular ejection fraction, by estimation, is 25 to 30%. The  left ventricle has severely decreased function. The left ventricle  demonstrates global hypokinesis. There is moderate concentric left  ventricular hypertrophy. Left ventricular  diastolic parameters are consistent with Grade III diastolic dysfunction  (restrictive). Elevated left ventricular end-diastolic pressure.  2. Right ventricular systolic function is mildly reduced. The right  ventricular size is normal. There is moderately elevated pulmonary artery  systolic pressure.  3. Left atrial size was moderately dilated.  4. Right atrial size was mildly dilated.  5. The mitral valve is normal in structure. Mild to moderate mitral valve  regurgitation.  6. Tricuspid valve regurgitation is moderate.  7. The aortic valve is grossly normal. Aortic valve regurgitation is not  visualized. No aortic stenosis is present.  8. Aortic dilatation noted. There is mild to moderate dilatation of the  ascending aorta measuring 43 mm.  9. The inferior vena cava is dilated in size with <50% respiratory  variability, suggesting right atrial pressure of 15 mmHg.   Antimicrobials: None   Subjective: Patient seen and examined at bedside.  Denies worsening shortness of breath, chest pain.  No overnight  fever or vomiting reported. Objective: Vitals:   05/13/20 1953 05/14/20  0236 05/14/20 0512 05/14/20 0741  BP: (!) 131/85 (!) 157/106 (!) 147/101 (!) 153/99  Pulse: 84 93 90 91  Resp: 18 19 16 18   Temp: 99 F (37.2 C) 99 F (37.2 C) 99.1 F (37.3 C) 98.8 F (37.1 C)  TempSrc: Oral Oral Oral Oral  SpO2: 100% 100% 100% 100%  Weight:  83.1 kg    Height:        Intake/Output Summary (Last 24 hours) at 05/14/2020 0753 Last data filed at 05/14/2020 0240 Gross per 24 hour  Intake 2038 ml  Output 600 ml  Net 1438 ml   Filed Weights   05/12/20 0629 05/13/20 0501 05/14/20 0236  Weight: 82.3 kg 82.1 kg 83.1 kg    Examination:  General exam: No distress. Respiratory system: Bilateral decreased breath sounds at bases with some crackles.  No wheezing Cardiovascular system: Rate controlled, S1-S2 heard Gastrointestinal system: Abdomen is nondistended, soft and nontender.  Bowel sounds are heard  extremities: Mild lower extremity edema present.  No clubbing    Data Reviewed: I have personally reviewed following labs and imaging studies  CBC: Recent Labs  Lab 05/10/20 0507 05/11/20 0342 05/12/20 0354 05/13/20 0901 05/14/20 0410  WBC 12.2* 14.9* 13.9* 12.2* 11.6*  NEUTROABS  --  12.4* 11.6* 9.7* 8.6*  HGB 12.0* 12.0* 10.7* 11.3* 9.9*  HCT 36.6* 37.7* 33.4* 35.8* 31.3*  MCV 81.3 81.8 82.9 82.9 83.0  PLT 228 258 212 247 798   Basic Metabolic Panel: Recent Labs  Lab 05/10/20 0507 05/10/20 0807 05/11/20 0342 05/12/20 0354 05/13/20 0901 05/14/20 0410  NA 136  --  138 135 135 135  K 2.8*  --  3.7 3.7 3.8 3.9  CL 102  --  105 103 102 102  CO2 25  --  20* 21* 23 23  GLUCOSE 107*  --  107* 120* 121* 124*  BUN 53*  --  56* 53* 48* 61*  CREATININE 4.61*  --  4.80* 4.93* 5.03* 5.13*  CALCIUM 8.8*  --  8.9 8.7* 9.4 8.8*  MG  --  2.1 2.3 2.1 2.3 2.2  PHOS  --   --  3.8 4.4 4.2 3.9   GFR: Estimated Creatinine Clearance: 19.7 mL/min (A) (by C-G formula based on SCr of 5.13 mg/dL (H)). Liver Function Tests: Recent Labs  Lab 05/10/20 0807  05/11/20 0342 05/12/20 0354 05/13/20 0901 05/14/20 0410  AST 31 17 16 18 18   ALT 26 23 20 24 22   ALKPHOS 59 64 52 55 57  BILITOT 1.3* 1.2 1.6* 1.3* 1.3*  PROT 6.2* 6.4* 6.0* 6.8 5.8*  ALBUMIN 3.1* 3.4* 3.0* 3.3* 2.9*   No results for input(s): LIPASE, AMYLASE in the last 168 hours. No results for input(s): AMMONIA in the last 168 hours. Coagulation Profile: No results for input(s): INR, PROTIME in the last 168 hours. Cardiac Enzymes: Recent Labs  Lab 05/10/20 1300  CKTOTAL 128   BNP (last 3 results) No results for input(s): PROBNP in the last 8760 hours. HbA1C: No results for input(s): HGBA1C in the last 72 hours. CBG: No results for input(s): GLUCAP in the last 168 hours. Lipid Profile: No results for input(s): CHOL, HDL, LDLCALC, TRIG, CHOLHDL, LDLDIRECT in the last 72 hours. Thyroid Function Tests: Recent Labs    05/12/20 0354  TSH 2.067  FREET4 1.18*  T3FREE 2.7   Anemia Panel: No results for input(s): VITAMINB12, FOLATE, FERRITIN, TIBC, IRON, RETICCTPCT in the  last 72 hours. Sepsis Labs: No results for input(s): PROCALCITON, LATICACIDVEN in the last 168 hours.  Recent Results (from the past 240 hour(s))  SARS Coronavirus 2 by RT PCR (hospital order, performed in [facility] hospital lab) Nasopharyngeal Nasopharyngeal Swab     Status: None   Collection Time: 05/09/20  2:59 PM   Specimen: Nasopharyngeal Swab  Result Value Ref Range Status   SARS Coronavirus 2 NEGATIVE NEGATIVE Final    Comment: (NOTE) SARS-CoV-2 target nucleic acids are NOT DETECTED.  The SARS-CoV-2 RNA is generally detectable in upper and lower respiratory specimens during the acute phase of infection. The lowest concentration of SARS-CoV-2 viral copies this assay can detect is 250 copies / mL. A negative result does not preclude SARS-CoV-2 infection and should not be used as the sole basis for treatment or other patient management decisions.  A negative result may occur with improper  specimen collection / handling, submission of specimen other than nasopharyngeal swab, presence of viral mutation(s) within the areas targeted by this assay, and inadequate number of viral copies (<250 copies / mL). A negative result must be combined with clinical observations, patient history, and epidemiological information.  Fact Sheet for Patients:   StrictlyIdeas.no  Fact Sheet for Healthcare Providers: BankingDealers.co.za  This test is not yet approved or  cleared by the [place] FDA and has been authorized for detection and/or diagnosis of SARS-CoV-2 by FDA under an Emergency Use Authorization (EUA).  This EUA will remain in effect (meaning this test can be used) for the duration of the COVID-19 declaration under Section 564(b)(1) of the Act, 21 U.S.C. section 360bbb-3(b)(1), unless the authorization is terminated or revoked sooner.  Performed at [facility], [address] [place], [place] [address]          Radiology Studies: VAS US RENAL ARTERY DUPLEX  Result Date: 05/13/2020 ABDOMINAL VISCERAL Indications: Hypertension and kidney disease. Patient stated that he has not              taken his meds for months. High Risk Factors: Hypertension. Performing Technologist: Oda Cogan RDMS, RVT  Examination Guidelines: A complete evaluation includes B-mode imaging, spectral Doppler, color Doppler, and power Doppler as needed of all accessible portions of each vessel. Bilateral testing is considered an integral part of a complete examination. Limited examinations for reoccurring indications may be performed as noted.  Duplex Findings: +--------------------+--------+--------+------+--------+ Mesenteric          PSV cm/sEDV cm/sPlaqueComments +--------------------+--------+--------+------+--------+ Aorta Mid              71                          +--------------------+--------+--------+------+--------+  Celiac Artery Origin  140                          +--------------------+--------+--------+------+--------+ SMA Origin            194                          +--------------------+--------+--------+------+--------+    +------------------+--------+--------+-------+ Right Renal ArteryPSV cm/sEDV cm/sComment +------------------+--------+--------+-------+ Origin               35      13           +------------------+--------+--------+-------+ Proximal             36  7            +------------------+--------+--------+-------+ Mid                  36      16           +------------------+--------+--------+-------+ Distal               43      19           +------------------+--------+--------+-------+ +-----------------+--------+--------+-------+ Left Renal ArteryPSV cm/sEDV cm/sComment +-----------------+--------+--------+-------+ Origin              43      9            +-----------------+--------+--------+-------+ Proximal            38      10           +-----------------+--------+--------+-------+ Mid                 28      4            +-----------------+--------+--------+-------+ Distal              34      8            +-----------------+--------+--------+-------+  Technologist observations: Kidneys - increased cortical echogenicity noted bilaterally with very pulsatile flow throughout the renal vein making it difficult to evaluate the renal arteries. +------------+--------+--------+----+-----------+--------+--------+----+ Right KidneyPSV cm/sEDV cm/sRI  Left KidneyPSV cm/sEDV cm/sRI   +------------+--------+--------+----+-----------+--------+--------+----+ Upper Pole  21      6       0.71Upper Pole 12      6       0.53 +------------+--------+--------+----+-----------+--------+--------+----+ Mid         20      3       0.85Mid        15      5       0.66  +------------+--------+--------+----+-----------+--------+--------+----+ Lower Pole  16      3       0.78Lower Pole 19      7       0.64 +------------+--------+--------+----+-----------+--------+--------+----+ Hilar       21      7       0.67Hilar                           +------------+--------+--------+----+-----------+--------+--------+----+ +------------------+-----+------------------+-----+ Right Kidney           Left Kidney             +------------------+-----+------------------+-----+ RAR                    RAR                     +------------------+-----+------------------+-----+ RAR (manual)           RAR (manual)            +------------------+-----+------------------+-----+ Cortex                 Cortex                  +------------------+-----+------------------+-----+ Cortex thickness       Corex thickness         +------------------+-----+------------------+-----+ Kidney length (cm)10.90Kidney length (cm)10.60 +------------------+-----+------------------+-----+  Summary: Renal:  Right: No evidence of right renal artery stenosis. Abnormal right        Resistive Index.  Normal size right kidney. Left:  No evidence of left renal artery stenosis. Normal left        Resistive Index. Normal size of left kidney. Mesenteric: Areas of limited visceral study include right parenchymal flow and left parenchymal flow.  *See table(s) above for measurements and observations.  Diagnosing physician: Curt Jews MD  Electronically signed by Curt Jews MD on 05/13/2020 at 5:21:49 PM.    Final         Scheduled Meds: . amLODipine  10 mg Oral Daily  . carvedilol  12.5 mg Oral BID WC  . ferrous sulfate  325 mg Oral Q breakfast  . heparin  5,000 Units Subcutaneous Q8H  . hydrALAZINE  100 mg Oral TID  . isosorbide dinitrate  40 mg Oral TID  . spironolactone  25 mg Oral Daily  . vitamin B-12  1,000 mcg Oral Daily   Continuous Infusions:        Aline August, MD [facility] 05/14/2020, 7:53 AM

## 2020-05-14 NOTE — Progress Notes (Signed)
Progress Note  Patient Name: Bobby Stevens Date of Encounter: 05/14/2020  [facility] Cardiologist: Buford Dresser, MD   Subjective   Denies any CP or SOB.   Inpatient Medications    Scheduled Meds:  amLODipine  10 mg Oral Daily   carvedilol  12.5 mg Oral BID WC   ferrous sulfate  325 mg Oral Q breakfast   heparin  5,000 Units Subcutaneous Q8H   hydrALAZINE  100 mg Oral TID   isosorbide dinitrate  40 mg Oral TID   spironolactone  25 mg Oral Daily   vitamin B-12  1,000 mcg Oral Daily   Continuous Infusions:  PRN Meds: acetaminophen **OR** acetaminophen, labetalol, ondansetron **OR** ondansetron (ZOFRAN) IV   Vital Signs    Vitals:   05/14/20 0236 05/14/20 0512 05/14/20 0741 05/14/20 1116  BP: (!) 157/106 (!) 147/101 (!) 153/99 (!) 139/93  Pulse: 93 90 91 84  Resp: 19 16 18 20   Temp: 99 F (37.2 C) 99.1 F (37.3 C) 98.8 F (37.1 C) 98.5 F (36.9 C)  TempSrc: Oral Oral Oral Oral  SpO2: 100% 100% 100% 100%  Weight: 83.1 kg     Height:        Intake/Output Summary (Last 24 hours) at 05/14/2020 1213 Last data filed at 05/14/2020 0240 Gross per 24 hour  Intake 1798 ml  Output --  Net 1798 ml   Last 3 Weights 05/14/2020 05/13/2020 05/12/2020  Weight (lbs) 183 lb 4.8 oz 181 lb 181 lb 6.4 oz  Weight (kg) 83.144 kg 82.101 kg 82.283 kg      Telemetry    NSR with HR 80s, no significant ventricular ectopy - Personally Reviewed  ECG    Sinus tachycardia with LVH, no significant ST-T wave changes- Personally Reviewed  Physical Exam   GEN: No acute distress.   Neck: No JVD Cardiac: RRR, no murmurs, rubs, or gallops.  Respiratory: Clear to auscultation bilaterally. GI: Soft, nontender, non-distended  MS: No edema; No deformity. Neuro:  Nonfocal  Psych: Normal affect   Labs    High Sensitivity Troponin:   Recent Labs  Lab 05/09/20 1240 05/09/20 1438 05/10/20 0807 05/11/20 0530  TROPONINIHS 105* 115* 79* 67*      Chemistry Recent  Labs  Lab 05/12/20 0354 05/13/20 0901 05/14/20 0410  NA 135 135 135  K 3.7 3.8 3.9  CL 103 102 102  CO2 21* 23 23  GLUCOSE 120* 121* 124*  BUN 53* 48* 61*  CREATININE 4.93* 5.03* 5.13*  CALCIUM 8.7* 9.4 8.8*  PROT 6.0* 6.8 5.8*  ALBUMIN 3.0* 3.3* 2.9*  AST 16 18 18   ALT 20 24 22   ALKPHOS 52 55 57  BILITOT 1.6* 1.3* 1.3*  GFRNONAA 13* 12* 12*  GFRAA 15* 14* 14*  ANIONGAP 11 10 10      Hematology Recent Labs  Lab 05/12/20 0354 05/13/20 0901 05/14/20 0410  WBC 13.9* 12.2* 11.6*  RBC 4.03* 4.32 3.77*  HGB 10.7* 11.3* 9.9*  HCT 33.4* 35.8* 31.3*  MCV 82.9 82.9 83.0  MCH 26.6 26.2 26.3  MCHC 32.0 31.6 31.6  RDW 14.2 14.4 14.2  PLT 212 247 224    BNP Recent Labs  Lab 05/09/20 1240 05/10/20 0807  BNP 2,048.3* 1,827.0*     DDimer  Recent Labs  Lab 05/09/20 1240  DDIMER 0.87*     Radiology    VAS US RENAL ARTERY DUPLEX  Result Date: 05/13/2020 ABDOMINAL VISCERAL Indications: Hypertension and kidney disease. Patient stated that he has not  taken his meds for months. High Risk Factors: Hypertension. Performing Technologist: Oda Cogan RDMS, RVT  Examination Guidelines: A complete evaluation includes B-mode imaging, spectral Doppler, color Doppler, and power Doppler as needed of all accessible portions of each vessel. Bilateral testing is considered an integral part of a complete examination. Limited examinations for reoccurring indications may be performed as noted.  Duplex Findings: +--------------------+--------+--------+------+--------+  Mesenteric           PSV cm/s EDV cm/s Plaque Comments  +--------------------+--------+--------+------+--------+  Aorta Mid               71                              +--------------------+--------+--------+------+--------+  Celiac Artery Origin   140                              +--------------------+--------+--------+------+--------+  SMA Origin             194                               +--------------------+--------+--------+------+--------+    +------------------+--------+--------+-------+  Right Renal Artery PSV cm/s EDV cm/s Comment  +------------------+--------+--------+-------+  Origin                35       13             +------------------+--------+--------+-------+  Proximal              36       7              +------------------+--------+--------+-------+  Mid                   36       16             +------------------+--------+--------+-------+  Distal                43       19             +------------------+--------+--------+-------+ +-----------------+--------+--------+-------+  Left Renal Artery PSV cm/s EDV cm/s Comment  +-----------------+--------+--------+-------+  Origin               43       9              +-----------------+--------+--------+-------+  Proximal             38       10             +-----------------+--------+--------+-------+  Mid                  28       4              +-----------------+--------+--------+-------+  Distal               34       8              +-----------------+--------+--------+-------+  Technologist observations: Kidneys - increased cortical echogenicity noted bilaterally with very pulsatile flow throughout the renal vein making it difficult to evaluate the renal arteries. +------------+--------+--------+----+-----------+--------+--------+----+  Right Kidney PSV cm/s EDV cm/s RI   Left Kidney PSV cm/s EDV cm/s RI    +------------+--------+--------+----+-----------+--------+--------+----+  Upper  Pole   21       6        0.71 Upper Pole  12       6        0.53  +------------+--------+--------+----+-----------+--------+--------+----+  Mid          20       3        0.85 Mid         15       5        0.66  +------------+--------+--------+----+-----------+--------+--------+----+  Lower Pole   16       3        0.78 Lower Pole  19       7        0.64  +------------+--------+--------+----+-----------+--------+--------+----+  Hilar        21        7        0.67 Hilar                               +------------+--------+--------+----+-----------+--------+--------+----+ +------------------+-----+------------------+-----+  Right Kidney             Left Kidney               +------------------+-----+------------------+-----+  RAR                      RAR                       +------------------+-----+------------------+-----+  RAR (manual)             RAR (manual)              +------------------+-----+------------------+-----+  Cortex                   Cortex                    +------------------+-----+------------------+-----+  Cortex thickness         Corex thickness           +------------------+-----+------------------+-----+  Kidney length (cm) 10.90 Kidney length (cm) 10.60  +------------------+-----+------------------+-----+  Summary: Renal:  Right: No evidence of right renal artery stenosis. Abnormal right        Resistive Index. Normal size right kidney. Left:  No evidence of left renal artery stenosis. Normal left        Resistive Index. Normal size of left kidney. Mesenteric: Areas of limited visceral study include right parenchymal flow and left parenchymal flow.  *See table(s) above for measurements and observations.  Diagnosing physician: Curt Jews MD  Electronically signed by Curt Jews MD on 05/13/2020 at 5:21:49 PM.    Final     Cardiac Studies   Echo 05/10/2020 1. Left ventricular ejection fraction, by estimation, is 25 to 30%. The  left ventricle has severely decreased function. The left ventricle  demonstrates global hypokinesis. There is moderate concentric left  ventricular hypertrophy. Left ventricular  diastolic parameters are consistent with Grade III diastolic dysfunction  (restrictive). Elevated left ventricular end-diastolic pressure.  2. Right ventricular systolic function is mildly reduced. The right  ventricular size is normal. There is moderately elevated pulmonary artery  systolic pressure.  3. Left atrial  size was moderately dilated.  4. Right atrial size was mildly dilated.  5. The mitral valve is normal in structure. Mild to moderate mitral valve  regurgitation.  6.  Tricuspid valve regurgitation is moderate.  7. The aortic valve is grossly normal. Aortic valve regurgitation is not  visualized. No aortic stenosis is present.  8. Aortic dilatation noted. There is mild to moderate dilatation of the  ascending aorta measuring 43 mm.  9. The inferior vena cava is dilated in size with <50% respiratory  variability, suggesting right atrial pressure of 15 mmHg.   Comparison(s): No prior Echocardiogram.   Patient Profile     49 y.o. male with PMH of HTN and medication noncompliance presented with hypertensive emergency and newly diagnosed LV dysfunction.   Assessment & Plan    1. Hypertensive cardiomyopathy  - Echo 05/10/2020 shows EF 25-30%, grade III DD, moderate LAE, moderately dilated ascending aorta  - renal function prevent any ischemic workup. Patient denies any CP  - suspect LV dysfunction is related to chroncally uncontrolled BP  - if patient's BP is controlled and able to be compliant with outpatient follow up, will consider repeat echo in 3 month.   - otherwise, euvolemic on exam.   2. Hypertensive emergency: SBP down to 130s now, continue coreg, hydralazine, imdur and spironolactone. Continue on current therapy, monitor renal function with Cr continue to trend up. Once renal function stabilizes and BP still elevated, may either increase coreg to 25mg  BID or add minoxidil.  3. Elevated troponin: likely demand ischemia  4. AKI: managed by nephrology       For questions or updates, please contact [facility] Please consult [email or web address] for contact info under        Signed, Almyra Deforest, [place]  05/14/2020, 12:13 PM

## 2020-05-14 NOTE — Progress Notes (Signed)
Admit: 05/09/2020 LOS: 5  35M with HTN emergency, AKI with nephrotic proteinuria  Subjective:   NAEO  BPs somewhat improved   07/28 0701 - 07/29 0700 In: 2038 [P.O.:2038] Out: 600 [Urine:600]  Filed Weights   05/12/20 0629 05/13/20 0501 05/14/20 0236  Weight: 82.3 kg 82.1 kg 83.1 kg    Scheduled Meds:  amLODipine  10 mg Oral Daily   carvedilol  12.5 mg Oral BID WC   ferrous sulfate  325 mg Oral Q breakfast   heparin  5,000 Units Subcutaneous Q8H   hydrALAZINE  100 mg Oral TID   isosorbide dinitrate  40 mg Oral TID   spironolactone  25 mg Oral Daily   vitamin B-12  1,000 mcg Oral Daily   Continuous Infusions: PRN Meds:.acetaminophen **OR** acetaminophen, labetalol, ondansetron **OR** ondansetron (ZOFRAN) IV  Current Labs: reviewed  NEGATIVE: HIV, HBV, HCV, ANA, dsDNA, ANCA, SPEP, SFLC PENDING: Renin/Aldo levels  Physical Exam:  Blood pressure (!) 139/93, pulse 84, temperature 98.5 F (36.9 C), temperature source Oral, resp. rate 20, height 6\' 1"  (1.854 m), weight 83.1 kg, SpO2 100 %. NAD, well-appearing Regular, normal S1 and S2 CTA B No peripheral edema Soft, nontender No rashes or lesions Nonfocal, CN II through XII intact  A 1. Renal failure, unclear chronicity, UPC with nephrotic range proteinuria; negative serologies 2. Hypertensive emergency, suspect subacute or chronic uncontrolled severe hypertension 3. Systolic heart failure, chronic; hypertensive heart disease 4. Severe hypokalemia  P  Cont current BP regimen for another 24h  Await PRA and Aldo  Ready for DC once GFR is stable.    Next would add on further loop diuretic and consider use of minoxidil if blood pressure remains elevated  Currently not feasible to do a biopsy, but this might require following up as an outpatient  Daily weights, Daily Renal Panel, Strict I/Os, Avoid nephrotoxins (NSAIDs, judicious IV Contrast)    Pearson Grippe MD 05/14/2020, 11:49 AM  Recent Labs  Lab  05/12/20 0354 05/13/20 0901 05/14/20 0410  NA 135 135 135  K 3.7 3.8 3.9  CL 103 102 102  CO2 21* 23 23  GLUCOSE 120* 121* 124*  BUN 53* 48* 61*  CREATININE 4.93* 5.03* 5.13*  CALCIUM 8.7* 9.4 8.8*  PHOS 4.4 4.2 3.9   Recent Labs  Lab 05/12/20 0354 05/13/20 0901 05/14/20 0410  WBC 13.9* 12.2* 11.6*  NEUTROABS 11.6* 9.7* 8.6*  HGB 10.7* 11.3* 9.9*  HCT 33.4* 35.8* 31.3*  MCV 82.9 82.9 83.0  PLT 212 247 224

## 2020-05-15 LAB — CBC WITH DIFFERENTIAL/PLATELET
Abs Immature Granulocytes: 0.08 10*3/uL — ABNORMAL HIGH (ref 0.00–0.07)
Basophils Absolute: 0 10*3/uL (ref 0.0–0.1)
Basophils Relative: 0 %
Eosinophils Absolute: 0.4 10*3/uL (ref 0.0–0.5)
Eosinophils Relative: 4 %
HCT: 31.5 % — ABNORMAL LOW (ref 39.0–52.0)
Hemoglobin: 10 g/dL — ABNORMAL LOW (ref 13.0–17.0)
Immature Granulocytes: 1 %
Lymphocytes Relative: 14 %
Lymphs Abs: 1.5 10*3/uL (ref 0.7–4.0)
MCH: 27.1 pg (ref 26.0–34.0)
MCHC: 31.7 g/dL (ref 30.0–36.0)
MCV: 85.4 fL (ref 80.0–100.0)
Monocytes Absolute: 0.7 10*3/uL (ref 0.1–1.0)
Monocytes Relative: 7 %
Neutro Abs: 8 10*3/uL — ABNORMAL HIGH (ref 1.7–7.7)
Neutrophils Relative %: 74 %
Platelets: 254 10*3/uL (ref 150–400)
RBC: 3.69 MIL/uL — ABNORMAL LOW (ref 4.22–5.81)
RDW: 14.3 % (ref 11.5–15.5)
WBC: 10.8 10*3/uL — ABNORMAL HIGH (ref 4.0–10.5)
nRBC: 0 % (ref 0.0–0.2)

## 2020-05-15 LAB — COMPREHENSIVE METABOLIC PANEL
ALT: 24 U/L (ref 0–44)
AST: 19 U/L (ref 15–41)
Albumin: 2.9 g/dL — ABNORMAL LOW (ref 3.5–5.0)
Alkaline Phosphatase: 63 U/L (ref 38–126)
Anion gap: 11 (ref 5–15)
BUN: 60 mg/dL — ABNORMAL HIGH (ref 6–20)
CO2: 23 mmol/L (ref 22–32)
Calcium: 8.8 mg/dL — ABNORMAL LOW (ref 8.9–10.3)
Chloride: 104 mmol/L (ref 98–111)
Creatinine, Ser: 4.89 mg/dL — ABNORMAL HIGH (ref 0.61–1.24)
GFR calc Af Amer: 15 mL/min — ABNORMAL LOW (ref >60)
GFR calc non Af Amer: 13 mL/min — ABNORMAL LOW (ref >60)
Glucose, Bld: 105 mg/dL — ABNORMAL HIGH (ref 70–99)
Potassium: 3.9 mmol/L (ref 3.5–5.1)
Sodium: 138 mmol/L (ref 135–145)
Total Bilirubin: 1.2 mg/dL (ref 0.3–1.2)
Total Protein: 6 g/dL — ABNORMAL LOW (ref 6.5–8.1)

## 2020-05-15 LAB — PHOSPHORUS: Phosphorus: 4.4 mg/dL (ref 2.5–4.6)

## 2020-05-15 LAB — METANEPHRINES, PLASMA
Metanephrine, Free: 51.7 pg/mL (ref 0.0–88.0)
Normetanephrine, Free: 247.6 pg/mL — ABNORMAL HIGH (ref 0.0–125.8)

## 2020-05-15 LAB — ALDOSTERONE + RENIN ACTIVITY W/ RATIO
ALDO / PRA Ratio: 0.7 (ref 0.0–30.0)
Aldosterone: 26 ng/dL (ref 0.0–30.0)
PRA LC/MS/MS: 38.447 ng/mL/hr — ABNORMAL HIGH (ref 0.167–5.380)

## 2020-05-15 LAB — MAGNESIUM: Magnesium: 2.1 mg/dL (ref 1.7–2.4)

## 2020-05-15 MED ORDER — SPIRONOLACTONE 25 MG PO TABS: 25.0000 mg | ORAL_TABLET | Freq: Every day | ORAL | 0 refills | Status: DC

## 2020-05-15 MED ORDER — ISOSORBIDE MONONITRATE ER 30 MG PO TB24
120.0000 mg | ORAL_TABLET | Freq: Every day | ORAL | 0 refills | Status: DC
Start: 2020-05-15 — End: 2020-09-18

## 2020-05-15 MED ORDER — ISOSORBIDE DINITRATE 40 MG PO TABS: 40.0000 mg | ORAL_TABLET | Freq: Three times a day (TID) | ORAL | 0 refills | Status: DC

## 2020-05-15 MED ORDER — FERROUS SULFATE 325 (65 FE) MG PO TABS: 325.0000 mg | ORAL_TABLET | Freq: Every day | ORAL | 0 refills | Status: DC

## 2020-05-15 MED ORDER — CYANOCOBALAMIN 1000 MCG PO TABS: 1000.0000 ug | ORAL_TABLET | Freq: Every day | ORAL | 0 refills | Status: DC

## 2020-05-15 MED ORDER — CARVEDILOL 25 MG PO TABS: 25.0000 mg | ORAL_TABLET | Freq: Two times a day (BID) | ORAL | 0 refills | Status: DC

## 2020-05-15 MED ORDER — CARVEDILOL 25 MG PO TABS: 25.0000 mg | ORAL_TABLET | Freq: Two times a day (BID) | ORAL | Status: DC

## 2020-05-15 MED ORDER — HYDRALAZINE HCL 100 MG PO TABS: 100.0000 mg | ORAL_TABLET | Freq: Three times a day (TID) | ORAL | 0 refills | Status: DC

## 2020-05-15 MED ORDER — AMLODIPINE BESYLATE 10 MG PO TABS: 10.0000 mg | ORAL_TABLET | Freq: Every day | ORAL | 0 refills | Status: DC

## 2020-05-15 MED FILL — hydrALAZINE HCL 100 MG TABS: 100 | 30 days supply | Qty: 90 | Fill #0

## 2020-05-15 MED FILL — SPIRONOLACTONE 25 MG TABLET: 25 | 30 days supply | Qty: 30 | Fill #0

## 2020-05-15 MED FILL — AMLODIPINE BESYLATE 10 MG T: 10 | 30 days supply | Qty: 30 | Fill #0

## 2020-05-15 MED FILL — CARVEDILOL 25 MG TABLET: 25 | 30 days supply | Qty: 60 | Fill #0

## 2020-05-15 MED FILL — FERROUS SULFATE 325 MG TAB: 325 (65 FE) | 30 days supply | Qty: 30 | Fill #0

## 2020-05-15 MED FILL — VITAMIN B-12 1000 MCG TABS: 1000 | 30 days supply | Qty: 30 | Fill #0

## 2020-05-15 MED FILL — ISOSORBIDE MN ER 30 MG TAB: 30 | 30 days supply | Qty: 120 | Fill #0

## 2020-05-15 NOTE — Progress Notes (Signed)
Progress Note  Patient Name: Bobby Stevens Date of Encounter: 05/15/2020  [facility] Cardiologist: Buford Dresser, MD   Subjective   Denies any CP or SOB.   Inpatient Medications    Scheduled Meds:  amLODipine  10 mg Oral Daily   carvedilol  12.5 mg Oral BID WC   ferrous sulfate  325 mg Oral Q breakfast   heparin  5,000 Units Subcutaneous Q8H   hydrALAZINE  100 mg Oral TID   isosorbide dinitrate  40 mg Oral TID   spironolactone  25 mg Oral Daily   vitamin B-12  1,000 mcg Oral Daily   Continuous Infusions:  PRN Meds: acetaminophen **OR** acetaminophen, labetalol, ondansetron **OR** ondansetron (ZOFRAN) IV   Vital Signs    Vitals:   05/15/20 0115 05/15/20 0444 05/15/20 0756 05/15/20 0816  BP: (!) 157/99 (!) 165/104 (!) 176/114   Pulse: 90 90 89 93  Resp: 16 18 18    Temp: 99.2 F (37.3 C) 98.7 F (37.1 C) 98.6 F (37 C)   TempSrc: Oral Oral Oral   SpO2: 100% 100% 100%   Weight:  82.9 kg    Height:        Intake/Output Summary (Last 24 hours) at 05/15/2020 0851 Last data filed at 05/15/2020 0800 Gross per 24 hour  Intake 958 ml  Output 1325 ml  Net -367 ml   Last 3 Weights 05/15/2020 05/14/2020 05/13/2020  Weight (lbs) 182 lb 12.8 oz 183 lb 4.8 oz 181 lb  Weight (kg) 82.918 kg 83.144 kg 82.101 kg      Telemetry    NSR without significant ventricular ectopy - Personally Reviewed  ECG    Sinus tachycardia with LVH - Personally Reviewed  Physical Exam   GEN: No acute distress.   Neck: No JVD Cardiac: RRR, no murmurs, rubs, or gallops.  Respiratory: Clear to auscultation bilaterally. GI: Soft, nontender, non-distended  MS: No edema; No deformity. Neuro:  Nonfocal  Psych: Normal affect   Labs    High Sensitivity Troponin:   Recent Labs  Lab 05/09/20 1240 05/09/20 1438 05/10/20 0807 05/11/20 0530  TROPONINIHS 105* 115* 79* 67*      Chemistry Recent Labs  Lab 05/13/20 0901 05/14/20 0410 05/15/20 0531  NA 135 135 138   K 3.8 3.9 3.9  CL 102 102 104  CO2 23 23 23   GLUCOSE 121* 124* 105*  BUN 48* 61* 60*  CREATININE 5.03* 5.13* 4.89*  CALCIUM 9.4 8.8* 8.8*  PROT 6.8 5.8* 6.0*  ALBUMIN 3.3* 2.9* 2.9*  AST 18 18 19   ALT 24 22 24   ALKPHOS 55 57 63  BILITOT 1.3* 1.3* 1.2  GFRNONAA 12* 12* 13*  GFRAA 14* 14* 15*  ANIONGAP 10 10 11      Hematology Recent Labs  Lab 05/13/20 0901 05/14/20 0410 05/15/20 0531  WBC 12.2* 11.6* 10.8*  RBC 4.32 3.77* 3.69*  HGB 11.3* 9.9* 10.0*  HCT 35.8* 31.3* 31.5*  MCV 82.9 83.0 85.4  MCH 26.2 26.3 27.1  MCHC 31.6 31.6 31.7  RDW 14.4 14.2 14.3  PLT 247 224 254    BNP Recent Labs  Lab 05/09/20 1240 05/10/20 0807  BNP 2,048.3* 1,827.0*     DDimer  Recent Labs  Lab 05/09/20 1240  DDIMER 0.87*     Radiology    No results found.  Cardiac Studies   Echo 05/10/2020 1. Left ventricular ejection fraction, by estimation, is 25 to 30%. The  left ventricle has severely decreased function. The left ventricle  demonstrates global hypokinesis. There is moderate concentric left  ventricular hypertrophy. Left ventricular  diastolic parameters are consistent with Grade III diastolic dysfunction  (restrictive). Elevated left ventricular end-diastolic pressure.  2. Right ventricular systolic function is mildly reduced. The right  ventricular size is normal. There is moderately elevated pulmonary artery  systolic pressure.  3. Left atrial size was moderately dilated.  4. Right atrial size was mildly dilated.  5. The mitral valve is normal in structure. Mild to moderate mitral valve  regurgitation.  6. Tricuspid valve regurgitation is moderate.  7. The aortic valve is grossly normal. Aortic valve regurgitation is not  visualized. No aortic stenosis is present.  8. Aortic dilatation noted. There is mild to moderate dilatation of the  ascending aorta measuring 43 mm.  9. The inferior vena cava is dilated in size with <50% respiratory  variability,  suggesting right atrial pressure of 15 mmHg.   Patient Profile     49 y.o. male with PMH of HTN and medication noncompliance presented with hypertensive emergency and newly diagnosed LV dysfunction.  Assessment & Plan    1. Hypertensive cardiomyopathy             - Echo 05/10/2020 shows EF 25-30%, grade III DD, moderate LAE, moderately dilated ascending aorta             - renal function prevent any ischemic workup. Patient denies any CP             - suspect LV dysfunction is related to chroncally uncontrolled BP             - if patient's BP is controlled and able to be compliant with outpatient follow up, will consider repeat echo in 3 month.              - otherwise, euvolemic on exam.   2. Hypertensive emergency: continue coreg, hydralazine, imdur and spironolactone. Continue on current therapy, monitor renal function with Cr continue to trend up.   - Cr stable, consider increase coreg to 25mg  BID prior to D/C. May consider minoxidil as outpatient.   3. Elevated troponin: likely demand ischemia  4. AKI: managed by nephrology, Cr stabilized.        For questions or updates, please contact [place] Please consult [email or web address] for contact info under        Signed, Almyra Deforest, [place]  05/15/2020, 8:51 AM

## 2020-05-15 NOTE — Progress Notes (Signed)
Admit: 05/09/2020 LOS: 6  87M with HTN emergency, AKI with nephrotic proteinuria  Subjective:  . NAEO . BPs stable elevated . SCr down to 4.9, K 3.9 . PRA and Aldo pending  07/29 0701 - 07/30 0700 In: 718 [P.O.:718] Out: 1100 [Urine:1100]  Filed Weights   05/13/20 0501 05/14/20 0236 05/15/20 0444  Weight: 82.1 kg 83.1 kg 82.9 kg    Scheduled Meds: . amLODipine  10 mg Oral Daily  . carvedilol  25 mg Oral BID WC  . ferrous sulfate  325 mg Oral Q breakfast  . heparin  5,000 Units Subcutaneous Q8H  . hydrALAZINE  100 mg Oral TID  . isosorbide dinitrate  40 mg Oral TID  . spironolactone  25 mg Oral Daily  . vitamin B-12  1,000 mcg Oral Daily   Continuous Infusions: PRN Meds:.acetaminophen **OR** acetaminophen, labetalol, ondansetron **OR** ondansetron (ZOFRAN) IV  Current Labs: reviewed  NEGATIVE: HIV, HBV, HCV, ANA, dsDNA, ANCA, SPEP, SFLC PENDING: Renin/Aldo levels  Physical Exam:  Blood pressure (!) 176/114, pulse 93, temperature 98.6 F (37 C), temperature source Oral, resp. rate 18, height 6\' 1"  (1.854 m), weight 82.9 kg, SpO2 100 %. NAD, well-appearing Regular, normal S1 and S2 CTA B No peripheral edema Soft, nontender No rashes or lesions Nonfocal, CN II through XII intact  A 1. Nonoliguric Renal failure, unclear chronicity, UPC with nephrotic range proteinuria; negative serologies 2. Hypertensive emergency, suspect subacute or chronic uncontrolled severe hypertension 3. Systolic heart failure, chronic; hypertensive heart disease 4. Severe hypokalemia  P . Increase carvediol to 25 BID . Await PRA and Aldo . OK For DC today and close f/u at our office, we will call him with appt . Asked him to use his home BP cuff, instructed on proper technique and record values 2x/d.  If SBP > 200 or DBP > 110 call [facility] office  Currently not feasible to do a biopsy, but this might require following up as an outpatient  Daily weights, Daily Renal Panel, Strict I/Os, Avoid  nephrotoxins (NSAIDs, judicious IV Contrast)    Pearson Grippe MD 05/15/2020, 10:53 AM  Recent Labs  Lab 05/13/20 0901 05/14/20 0410 05/15/20 0531  NA 135 135 138  K 3.8 3.9 3.9  CL 102 102 104  CO2 23 23 23   GLUCOSE 121* 124* 105*  BUN 48* 61* 60*  CREATININE 5.03* 5.13* 4.89*  CALCIUM 9.4 8.8* 8.8*  PHOS 4.2 3.9 4.4   Recent Labs  Lab 05/13/20 0901 05/14/20 0410 05/15/20 0531  WBC 12.2* 11.6* 10.8*  NEUTROABS 9.7* 8.6* 8.0*  HGB 11.3* 9.9* 10.0*  HCT 35.8* 31.3* 31.5*  MCV 82.9 83.0 85.4  PLT 247 224 254

## 2020-05-15 NOTE — Care Management (Addendum)
1042 05-15-20 Case Manager did speak with patient and he has hospital follow up appointment scheduled at the [facility]. Patient can utilize the [facility] for medication assistance. Case Manager will follow patient for medication assistance vs MATCH assistance. Bethena Roys, RN,BSN Case Manager    1155 05-15-20 MATCH completed for this patient. TOC will deliver the medications to the patient. Scale obtained from the [facility]. Bethena Roys, RN,BSN Case Manager

## 2020-05-15 NOTE — Discharge Summary (Addendum)
Physician Discharge Summary  Carliss Quast AUQ:[id number] DOB: Jul 10, 1971 DOA: 05/09/2020  PCP: System, Pcp Not In  Admit date: 05/09/2020 Discharge date: 05/15/2020  Admitted From: Home Disposition: Home  Recommendations for Outpatient Follow-up:  1. Follow up with PCP in 1 week with repeat CBC/BMP 2. Outpatient follow-up with nephrology and cardiology 3. Compliant with medications and follow-up 4. Follow up in ED if symptoms worsen or new appear   Home Health: No Equipment/Devices: None  Discharge Condition: Stable CODE STATUS: Full Diet recommendation: Heart healthy/fluid restriction of up to 1200 cc a day  Brief/Interim Summary: 49 year old female with history of uncontrolled hypertension, noncompliance to medications due to financial constraints presented with weakness and lightheadedness.  On presentation, his blood pressure was 246/181 initially.  He was found to have BUN of 57 with creatinine of 4.55.  Echo showed EF of 25 to 30% with global hypokinesis of the right ventricle with LVH along with grade 3 diastolic dysfunction.  Cardiology and nephrology were consulted.  During the hospitalization, his blood pressure remained extremely elevated and needed initiation and adjustment of various antihypertensives per nephrology.  Currently blood pressure is still elevated but nephrology feels that he can be discharged on current antihypertensive regimen.  Creatinine is also elevated but stabilizing.  He will need close outpatient follow-up with nephrology and cardiology.  He will be discharged home today.  Discharge Diagnoses:   Hypertensive emergency -Presented with blood pressure of 246/181 initially. -Blood pressure still elevated.  Currently on amlodipine, Coreg, hydralazine, isosorbide mononitrate and spironolactone.  Cardiology and nephrology following and adjusting the doses of antihypertensives.  Work-up for secondary hypertension including pheochromocytoma and  hyperaldosteronism are ongoing per nephrology -Nephrology feels that he can be discharged on current antihypertensive regimen.  -He will need close outpatient follow-up with nephrology and cardiology.  He will be discharged home today.   Acute kidney injury/hypertensive nephropathy -Nephrology following.  Creatinine slightly improved today at 4.89.  -Unfortunately, unable to do renal biopsy due to uncontrolled blood pressure -Outpatient close follow-up with nephrology.  Acute hypertensive cardiomyopathy/acute systolic and diastolic heart failure -Echo showed both systolic and diastolic heart failure with grade 3 diastolic dysfunction with mild LVH with EF of 25 to 30% with global hypokinesis of the left ventricle -Fluid restriction.  Renal function precludes cardiac cath for now. -Continue antihypertensives as above.  Cardiology following.  Outpatient follow-up with cardiology  Leukocytosis -Probably reactive.  ResolvedHypokalemia -Improved  Normocytic anemia -Multifactorial due to vitamin B12 deficiency, iron deficiency and anemia of chronic disease -Continue oral iron supplementation and oral vitamin B12   Discharge Instructions  Discharge Instructions    Diet - low sodium heart healthy   Complete by: As directed    Increase activity slowly   Complete by: As directed      Allergies as of 05/15/2020      Reactions   Penicillins Rash      Medication List    STOP taking these medications   hydrochlorothiazide 25 MG tablet Commonly known as: HYDRODIURIL   lisinopril 20 MG tablet Commonly known as: ZESTRIL     TAKE these medications   amLODipine 10 MG tablet Commonly known as: NORVASC Take 1 tablet (10 mg total) by mouth daily. Start taking on: May 16, 2020   carvedilol 25 MG tablet Commonly known as: COREG Take 1 tablet (25 mg total) by mouth 2 (two) times daily with a meal.   cyanocobalamin 1000 MCG tablet Take 1 tablet (1,000 mcg total) by mouth  daily. Start  taking on: May 16, 2020   ferrous sulfate 325 (65 FE) MG tablet Take 1 tablet (325 mg total) by mouth daily with breakfast. Start taking on: May 16, 2020   hydrALAZINE 100 MG tablet Commonly known as: APRESOLINE Take 1 tablet (100 mg total) by mouth 3 (three) times daily.   isosorbide mononitrate 30 MG 24 hr tablet Commonly known as: IMDUR Take 4 tablets (120 mg total) by mouth daily.   spironolactone 25 MG tablet Commonly known as: ALDACTONE Take 1 tablet (25 mg total) by mouth daily. Start taking on: May 16, 2020        Follow-up [facility]. Go to.   Why: Your appointment is for Thursday, June 11, 2020 at 1:50pm please arrive 15 minutes early. Contact information: [place] [address] [facility] Follow up.   Why: Please go to this location for pharmacy assistance for medications. Medications range from $4.00-$10.00 Contact information: [place] [address] [phone]       Rexene Agent, MD. Schedule an appointment as soon as possible for a visit in 1 week(s).   Specialty: Nephrology Contact information: [place] [address] [phone]              Allergies  Allergen Reactions  . Penicillins Rash    Consultations:  Cardiology/nephrology   Procedures/Studies: US RENAL  Result Date: 05/10/2020 CLINICAL DATA:  Renal insufficiency. EXAM: RENAL / URINARY TRACT ULTRASOUND COMPLETE COMPARISON:  None. FINDINGS: Right Kidney: Renal measurements: 10.9 x 4.4 x 3.7 cm = volume: 93 mL. Moderate increased cortical echogenicity. No mass or hydronephrosis visualized. Left Kidney: Renal measurements: 10.3 x 5.8 x 3.3 cm = volume: 101 mL. Mild increased cortical echogenicity. No mass or hydronephrosis visualized. Bladder: Appears normal for degree of  bladder distention. Other: None. IMPRESSION: Normal size kidneys with increased cortical echogenicity which can be seen in medical renal disease. No hydronephrosis. Electronically Signed   By: Marin Olp M.D.   On: 05/10/2020 08:22   DG Chest Port 1 View  Result Date: 05/09/2020 CLINICAL DATA:  Generalized weakness and fatigue for 2 weeks, hypertension EXAM: PORTABLE CHEST 1 VIEW COMPARISON:  Portable exam 1247 hours without priors for comparison FINDINGS: Enlargement of cardiac silhouette. Mediastinal contours and pulmonary vascularity normal. Minimal RIGHT basilar atelectasis. Remaining lungs clear. No infiltrate, pleural effusion or pneumothorax. Osseous structures unremarkable. IMPRESSION: Enlargement of cardiac silhouette with minimal RIGHT basilar atelectasis. Electronically Signed   By: Lavonia Dana M.D.   On: 05/09/2020 13:18   ECHOCARDIOGRAM COMPLETE  Result Date: 05/10/2020    ECHOCARDIOGRAM REPORT   Patient Name:   ARVIN ABELLO Date of Exam: 05/10/2020 Medical Rec #:  [id number]       Height:       73.0 in Accession #:    [id number]      Weight:       182.8 lb Date of Birth:  [birth year]      BSA:          2.071 m Patient Age:    8 years        BP:           186/128 mmHg Patient Gender: M               HR:           97 bpm.  Exam Location:  Inpatient Procedure: 2D Echo, Cardiac Doppler and Color Doppler Indications:    R94.31 Abnormal EKG; Elevated Troponin.  History:        Patient has no prior history of Echocardiogram examinations.                 Risk Factors:Hypertension.  Sonographer:    Jonelle Sidle Dance Referring Phys: [place] Neighbors  1. Left ventricular ejection fraction, by estimation, is 25 to 30%. The left ventricle has severely decreased function. The left ventricle demonstrates global hypokinesis. There is moderate concentric left ventricular hypertrophy. Left ventricular diastolic parameters are consistent with Grade III diastolic dysfunction (restrictive). Elevated  left ventricular end-diastolic pressure.  2. Right ventricular systolic function is mildly reduced. The right ventricular size is normal. There is moderately elevated pulmonary artery systolic pressure.  3. Left atrial size was moderately dilated.  4. Right atrial size was mildly dilated.  5. The mitral valve is normal in structure. Mild to moderate mitral valve regurgitation.  6. Tricuspid valve regurgitation is moderate.  7. The aortic valve is grossly normal. Aortic valve regurgitation is not visualized. No aortic stenosis is present.  8. Aortic dilatation noted. There is mild to moderate dilatation of the ascending aorta measuring 43 mm.  9. The inferior vena cava is dilated in size with <50% respiratory variability, suggesting right atrial pressure of 15 mmHg. Comparison(s): No prior Echocardiogram. Conclusion(s)/Recommendation(s): Severely reduced LVEF with global hypokinesis. While LV measurements are within normal limits, appearance of mitral valve suggests mitral annular dilation secondary to LV dilation. Elevated LVEDP and elevated right sided pressures, with moderate bordering on severe pulmonary hypertension. FINDINGS  Left Ventricle: Left ventricular ejection fraction, by estimation, is 25 to 30%. The left ventricle has severely decreased function. The left ventricle demonstrates global hypokinesis. The left ventricular internal cavity size was normal in size. There is moderate concentric left ventricular hypertrophy. Left ventricular diastolic parameters are consistent with Grade III diastolic dysfunction (restrictive). Elevated left ventricular end-diastolic pressure. Right Ventricle: The right ventricular size is normal. Right vetricular wall thickness was not assessed. Right ventricular systolic function is mildly reduced. There is moderately elevated pulmonary artery systolic pressure. The tricuspid regurgitant velocity is 3.26 m/s, and with an assumed right atrial pressure of 15 mmHg, the  estimated right ventricular systolic pressure is 67.6 mmHg. Left Atrium: Left atrial size was moderately dilated. Right Atrium: Right atrial size was mildly dilated. Pericardium: A small pericardial effusion is present. The pericardial effusion is circumferential. Mitral Valve: The mitral valve is normal in structure. Mild to moderate mitral valve regurgitation. Tricuspid Valve: The tricuspid valve is normal in structure. Tricuspid valve regurgitation is moderate. Aortic Valve: The aortic valve is grossly normal. Aortic valve regurgitation is not visualized. No aortic stenosis is present. Pulmonic Valve: The pulmonic valve was not well visualized. Pulmonic valve regurgitation is mild to moderate. Aorta: Aortic dilatation noted. There is mild to moderate dilatation of the ascending aorta measuring 43 mm. Venous: The inferior vena cava is dilated in size with less than 50% respiratory variability, suggesting right atrial pressure of 15 mmHg. IAS/Shunts: The atrial septum is grossly normal.  LEFT VENTRICLE PLAX 2D LVIDd:         4.90 cm  Diastology LVIDs:         4.20 cm  LV e' lateral:   5.15 cm/s LV PW:         1.60 cm  LV E/e' lateral: 20.7 LV IVS:  1.20 cm  LV e' medial:    4.19 cm/s LVOT diam:     2.30 cm  LV E/e' medial:  25.4 LV SV:         52 LV SV Index:   25 LVOT Area:     4.15 cm  RIGHT VENTRICLE            IVC RV Basal diam:  3.40 cm    IVC diam: 2.60 cm RV Mid diam:    2.00 cm RV S prime:     8.18 cm/s TAPSE (M-mode): 1.5 cm LEFT ATRIUM             Index       RIGHT ATRIUM           Index LA diam:        5.00 cm 2.41 cm/m  RA Area:     18.10 cm LA Vol (A2C):   85.9 ml 41.48 ml/m RA Volume:   56.50 ml  27.28 ml/m LA Vol (A4C):   88.0 ml 42.49 ml/m LA Biplane Vol: 87.2 ml 42.11 ml/m  AORTIC VALVE LVOT Vmax:   74.60 cm/s LVOT Vmean:  54.600 cm/s LVOT VTI:    0.126 m  AORTA Ao Root diam: 4.30 cm Ao Asc diam:  4.25 cm MITRAL VALVE                TRICUSPID VALVE MV Area (PHT): 4.04 cm     TR Peak  grad:   42.5 mmHg MV Decel Time: 188 msec     TR Vmax:        326.00 cm/s MV E velocity: 106.50 cm/s MV A velocity: 44.00 cm/s   SHUNTS MV E/A ratio:  2.42         Systemic VTI:  0.13 m                             Systemic Diam: 2.30 cm Buford Dresser MD Electronically signed by Buford Dresser MD Signature Date/Time: 05/10/2020/10:40:47 AM    Final    VAS US RENAL ARTERY DUPLEX  Result Date: 05/13/2020 ABDOMINAL VISCERAL Indications: Hypertension and kidney disease. Patient stated that he has not              taken his meds for months. High Risk Factors: Hypertension. Performing Technologist: Oda Cogan RDMS, RVT  Examination Guidelines: A complete evaluation includes B-mode imaging, spectral Doppler, color Doppler, and power Doppler as needed of all accessible portions of each vessel. Bilateral testing is considered an integral part of a complete examination. Limited examinations for reoccurring indications may be performed as noted.  Duplex Findings: +--------------------+--------+--------+------+--------+ Mesenteric          PSV cm/sEDV cm/sPlaqueComments +--------------------+--------+--------+------+--------+ Aorta Mid              71                          +--------------------+--------+--------+------+--------+ Celiac Artery Origin  140                          +--------------------+--------+--------+------+--------+ SMA Origin            194                          +--------------------+--------+--------+------+--------+    +------------------+--------+--------+-------+ Right Renal ArteryPSV cm/sEDV cm/sComment +------------------+--------+--------+-------+ Origin  35      13           +------------------+--------+--------+-------+ Proximal             36      7            +------------------+--------+--------+-------+ Mid                  36      16           +------------------+--------+--------+-------+ Distal                43      19           +------------------+--------+--------+-------+ +-----------------+--------+--------+-------+ Left Renal ArteryPSV cm/sEDV cm/sComment +-----------------+--------+--------+-------+ Origin              43      9            +-----------------+--------+--------+-------+ Proximal            38      10           +-----------------+--------+--------+-------+ Mid                 28      4            +-----------------+--------+--------+-------+ Distal              34      8            +-----------------+--------+--------+-------+  Technologist observations: Kidneys - increased cortical echogenicity noted bilaterally with very pulsatile flow throughout the renal vein making it difficult to evaluate the renal arteries. +------------+--------+--------+----+-----------+--------+--------+----+ Right KidneyPSV cm/sEDV cm/sRI  Left KidneyPSV cm/sEDV cm/sRI   +------------+--------+--------+----+-----------+--------+--------+----+ Upper Pole  21      6       0.71Upper Pole 12      6       0.53 +------------+--------+--------+----+-----------+--------+--------+----+ Mid         20      3       0.85Mid        15      5       0.66 +------------+--------+--------+----+-----------+--------+--------+----+ Lower Pole  16      3       0.78Lower Pole 19      7       0.64 +------------+--------+--------+----+-----------+--------+--------+----+ Hilar       21      7       0.67Hilar                           +------------+--------+--------+----+-----------+--------+--------+----+ +------------------+-----+------------------+-----+ Right Kidney           Left Kidney             +------------------+-----+------------------+-----+ RAR                    RAR                     +------------------+-----+------------------+-----+ RAR (manual)           RAR (manual)            +------------------+-----+------------------+-----+ Cortex                  Cortex                  +------------------+-----+------------------+-----+ Cortex thickness       Corex thickness         +------------------+-----+------------------+-----+  Kidney length (cm)10.90Kidney length (cm)10.60 +------------------+-----+------------------+-----+  Summary: Renal:  Right: No evidence of right renal artery stenosis. Abnormal right        Resistive Index. Normal size right kidney. Left:  No evidence of left renal artery stenosis. Normal left        Resistive Index. Normal size of left kidney. Mesenteric: Areas of limited visceral study include right parenchymal flow and left parenchymal flow.  *See table(s) above for measurements and observations.  Diagnosing physician: Curt Jews MD  Electronically signed by Curt Jews MD on 05/13/2020 at 5:21:49 PM.    Final        Subjective: Patient seen and examined at bedside.  Denies any worsening shortness, chest pain, nausea or vomiting.  Feels that he is similar to vomiting.  Discharge Exam: Vitals:   05/15/20 0756 05/15/20 0816  BP: (!) 176/114   Pulse: 89 93  Resp: 18   Temp: 98.6 F (37 C)   SpO2: 100%     General: Pt is alert, awake, not in acute distress Cardiovascular: rate controlled, S1/S2 + Respiratory: bilateral decreased breath sounds at bases with some scattered crackles Abdominal: Soft, NT, ND, bowel sounds + Extremities: Trace lower extremity edema; no cyanosis    The results of significant diagnostics from this hospitalization (including imaging, microbiology, ancillary and laboratory) are listed below for reference.     Microbiology: Recent Results (from the past 240 hour(s))  SARS Coronavirus 2 by RT PCR (hospital order, performed in [facility] hospital lab) Nasopharyngeal Nasopharyngeal Swab     Status: None   Collection Time: 05/09/20  2:59 PM   Specimen: Nasopharyngeal Swab  Result Value Ref Range Status   SARS Coronavirus 2 NEGATIVE NEGATIVE Final    Comment:  (NOTE) SARS-CoV-2 target nucleic acids are NOT DETECTED.  The SARS-CoV-2 RNA is generally detectable in upper and lower respiratory specimens during the acute phase of infection. The lowest concentration of SARS-CoV-2 viral copies this assay can detect is 250 copies / mL. A negative result does not preclude SARS-CoV-2 infection and should not be used as the sole basis for treatment or other patient management decisions.  A negative result may occur with improper specimen collection / handling, submission of specimen other than nasopharyngeal swab, presence of viral mutation(s) within the areas targeted by this assay, and inadequate number of viral copies (<250 copies / mL). A negative result must be combined with clinical observations, patient history, and epidemiological information.  Fact Sheet for Patients:   StrictlyIdeas.no  Fact Sheet for Healthcare Providers: BankingDealers.co.za  This test is not yet approved or  cleared by the [place] FDA and has been authorized for detection and/or diagnosis of SARS-CoV-2 by FDA under an Emergency Use Authorization (EUA).  This EUA will remain in effect (meaning this test can be used) for the duration of the COVID-19 declaration under Section 564(b)(1) of the Act, 21 U.S.C. section 360bbb-3(b)(1), unless the authorization is terminated or revoked sooner.  Performed at [facility], [address] [place], [place] [address]      Labs: BNP (last 3 results) Recent Labs    05/09/20 1240 05/10/20 0807  BNP 2,048.3* 0,814.4*   Basic Metabolic Panel: Recent Labs  Lab 05/11/20 0342 05/12/20 0354 05/13/20 0901 05/14/20 0410 05/15/20 0531  NA 138 135 135 135 138  K 3.7 3.7 3.8 3.9 3.9  CL 105 103 102 102 104  CO2 20* 21* 23 23 23   GLUCOSE 107* 120* 121* 124* 105*  BUN 56* 53* 48* 61* 60*  CREATININE 4.80* 4.93* 5.03* 5.13* 4.89*  CALCIUM 8.9 8.7* 9.4 8.8* 8.8*   MG 2.3 2.1 2.3 2.2 2.1  PHOS 3.8 4.4 4.2 3.9 4.4   Liver Function Tests: Recent Labs  Lab 05/11/20 0342 05/12/20 0354 05/13/20 0901 05/14/20 0410 05/15/20 0531  AST 17 16 18 18 19   ALT 23 20 24 22 24   ALKPHOS 64 52 55 57 63  BILITOT 1.2 1.6* 1.3* 1.3* 1.2  PROT 6.4* 6.0* 6.8 5.8* 6.0*  ALBUMIN 3.4* 3.0* 3.3* 2.9* 2.9*   No results for input(s): LIPASE, AMYLASE in the last 168 hours. No results for input(s): AMMONIA in the last 168 hours. CBC: Recent Labs  Lab 05/11/20 0342 05/12/20 0354 05/13/20 0901 05/14/20 0410 05/15/20 0531  WBC 14.9* 13.9* 12.2* 11.6* 10.8*  NEUTROABS 12.4* 11.6* 9.7* 8.6* 8.0*  HGB 12.0* 10.7* 11.3* 9.9* 10.0*  HCT 37.7* 33.4* 35.8* 31.3* 31.5*  MCV 81.8 82.9 82.9 83.0 85.4  PLT 258 212 247 224 254   Cardiac Enzymes: Recent Labs  Lab 05/10/20 1300  CKTOTAL 128   BNP: Invalid input(s): POCBNP CBG: No results for input(s): GLUCAP in the last 168 hours. D-Dimer No results for input(s): DDIMER in the last 72 hours. Hgb A1c No results for input(s): HGBA1C in the last 72 hours. Lipid Profile No results for input(s): CHOL, HDL, LDLCALC, TRIG, CHOLHDL, LDLDIRECT in the last 72 hours. Thyroid function studies No results for input(s): TSH, T4TOTAL, T3FREE, THYROIDAB in the last 72 hours.  Invalid input(s): FREET3 Anemia work up No results for input(s): VITAMINB12, FOLATE, FERRITIN, TIBC, IRON, RETICCTPCT in the last 72 hours. Urinalysis    Component Value Date/Time   COLORURINE YELLOW 05/09/2020 2051   APPEARANCEUR CLEAR 05/09/2020 2051   LABSPEC 1.012 05/09/2020 2051   PHURINE 6.0 05/09/2020 2051   GLUCOSEU 50 (A) 05/09/2020 2051   HGBUR SMALL (A) 05/09/2020 2051   BILIRUBINUR NEGATIVE 05/09/2020 2051   KETONESUR NEGATIVE 05/09/2020 2051   PROTEINUR >=300 (A) 05/09/2020 2051   NITRITE NEGATIVE 05/09/2020 2051   LEUKOCYTESUR NEGATIVE 05/09/2020 2051   Sepsis Labs Invalid input(s): PROCALCITONIN,  WBC,   LACTICIDVEN Microbiology Recent Results (from the past 240 hour(s))  SARS Coronavirus 2 by RT PCR (hospital order, performed in [place] hospital lab) Nasopharyngeal Nasopharyngeal Swab     Status: None   Collection Time: 05/09/20  2:59 PM   Specimen: Nasopharyngeal Swab  Result Value Ref Range Status   SARS Coronavirus 2 NEGATIVE NEGATIVE Final    Comment: (NOTE) SARS-CoV-2 target nucleic acids are NOT DETECTED.  The SARS-CoV-2 RNA is generally detectable in upper and lower respiratory specimens during the acute phase of infection. The lowest concentration of SARS-CoV-2 viral copies this assay can detect is 250 copies / mL. A negative result does not preclude SARS-CoV-2 infection and should not be used as the sole basis for treatment or other patient management decisions.  A negative result may occur with improper specimen collection / handling, submission of specimen other than nasopharyngeal swab, presence of viral mutation(s) within the areas targeted by this assay, and inadequate number of viral copies (<250 copies / mL). A negative result must be combined with clinical observations, patient history, and epidemiological information.  Fact Sheet for Patients:   StrictlyIdeas.no  Fact Sheet for Healthcare Providers: BankingDealers.co.za  This test is not yet approved or  cleared by the [place] FDA and has been authorized for detection and/or diagnosis of SARS-CoV-2 by FDA under an  Emergency Use Authorization (EUA).  This EUA will remain in effect (meaning this test can be used) for the duration of the COVID-19 declaration under Section 564(b)(1) of the Act, 21 U.S.C. section 360bbb-3(b)(1), unless the authorization is terminated or revoked sooner.  Performed at [facility], [address], [place], [place] [address]      Time coordinating discharge: 35 minutes  SIGNED:   Aline August, MD  [facility] 05/15/2020, 11:05 AM

## 2020-06-10 ENCOUNTER — Other Ambulatory Visit: Payer: Self-pay | Admitting: Nephrology

## 2020-06-11 ENCOUNTER — Encounter (INDEPENDENT_AMBULATORY_CARE_PROVIDER_SITE_OTHER): Payer: Self-pay | Admitting: Primary Care

## 2020-06-11 ENCOUNTER — Other Ambulatory Visit: Payer: Self-pay

## 2020-06-11 ENCOUNTER — Ambulatory Visit (INDEPENDENT_AMBULATORY_CARE_PROVIDER_SITE_OTHER): Payer: Self-pay | Admitting: Primary Care

## 2020-06-11 VITALS — BP 154/70 | HR 90 | Temp 97.5°F | Ht 72.0 in | Wt 196.0 lb

## 2020-06-11 DIAGNOSIS — I43 Cardiomyopathy in diseases classified elsewhere: Secondary | ICD-10-CM

## 2020-06-11 DIAGNOSIS — Z7689 Persons encountering health services in other specified circumstances: Secondary | ICD-10-CM

## 2020-06-11 DIAGNOSIS — I110 Hypertensive heart disease with heart failure: Secondary | ICD-10-CM

## 2020-06-11 DIAGNOSIS — Z09 Encounter for follow-up examination after completed treatment for conditions other than malignant neoplasm: Secondary | ICD-10-CM

## 2020-06-11 MED FILL — AMLODIPINE BESYLATE 10 MG T: 10 | 30 days supply | Qty: 30 | Fill #0

## 2020-06-11 MED FILL — CARVEDILOL 25 MG TABLET: 25 | 30 days supply | Qty: 60 | Fill #0

## 2020-06-11 MED FILL — hydrALAZINE HCL 100 MG TABS: 100 | 30 days supply | Qty: 90 | Fill #0

## 2020-06-11 MED FILL — ISOSORBIDE MN ER 30 MG TAB: 30 | 30 days supply | Qty: 120 | Fill #0

## 2020-06-11 MED FILL — SPIRONOLACTONE 50 MG TABS: 50 | 30 days supply | Qty: 30 | Fill #0

## 2020-06-11 NOTE — Progress Notes (Signed)
New Patient Office Visit  Subjective:  Patient ID: Bobby Stevens, male    DOB: 11-14-1970  Age: 49 y.o. MRN: [id number]  CC:  Chief Complaint  Patient presents with  . New Patient (Initial Visit)    Pt. is here to establish care.     HPI Bobby Stevens is a 49 year old male who  presents for hospital follow up and establishment of care. Admit date to the hospital was 05/09/20, patient was discharged from the hospital on 05/15/20, patient was admitted for hypertension urgency.Denies shortness of breath, headaches, chest pain or lower extremity edema. He admits to light headed at times not sure if medication is related  Past Medical History:  Diagnosis Date  . Hypertension 2020     Allergies  Allergen Reactions  . Penicillins Rash      Current Outpatient Medications on File Prior to Visit  Medication Sig Dispense Refill  . amLODipine (NORVASC) 10 MG tablet Take 1 tablet (10 mg total) by mouth daily. 30 tablet 0  . carvedilol (COREG) 25 MG tablet Take 1 tablet (25 mg total) by mouth 2 (two) times daily with a meal. 60 tablet 0  . ferrous sulfate 325 (65 FE) MG tablet Take 1 tablet (325 mg total) by mouth daily with breakfast. 30 tablet 0  . hydrALAZINE (APRESOLINE) 100 MG tablet Take 1 tablet (100 mg total) by mouth 3 (three) times daily. 90 tablet 0  . isosorbide mononitrate (IMDUR) 30 MG 24 hr tablet Take 4 tablets (120 mg total) by mouth daily. 120 tablet 0  . spironolactone (ALDACTONE) 25 MG tablet Take 1 tablet (25 mg total) by mouth daily. 30 tablet 0  . vitamin B-12 1000 MCG tablet Take 1 tablet (1,000 mcg total) by mouth daily. 30 tablet 0   No current facility-administered medications on file prior to visit.    ROS: all negative except above.   Physical Exam: BP (!) 154/70 (BP Location: Left Arm, Patient Position: Sitting, Cuff Size: Normal)   Pulse 90   Temp (!) 97.5 F (36.4 C) (Temporal)   Ht 6' (1.829 m)   Wt 196 lb (88.9 kg)   SpO2 99%   BMI 26.58  kg/m  General Appearance: Well nourished,thin frame male  in no apparent distress. Eyes: PERRLA, EOMs, conjunctiva no swelling or erythema Sinuses: No Frontal/maxillary tenderness ENT/Mouth: Ext aud canals clear, TMs without erythema, bulging. No erythema, swelling, or exudate on post pharynx.  Tonsils not swollen or erythematous. Hearing normal.  Neck: Supple, thyroid normal.  Respiratory: Respiratory effort normal, BS equal bilaterally without rales, rhonchi, wheezing or stridor.  Cardio: RRR with no MRGs. Brisk peripheral pulses without edema.  Abdomen: Soft, + BS.  Non tender, no guarding, rebound, hernias, masses. Lymphatics: Non tender without lymphadenopathy.  Musculoskeletal: Full ROM, 5/5 strength, normal gait.  Skin: Warm, dry without rashes, lesions, ecchymosis.  Neuro: Cranial nerves intact. Normal muscle tone, no cerebellar symptoms. Sensation intact.  Psych: Awake and oriented X 3, normal affect, Insight and Judgment appropriate.    Bobby Stevens was seen today for new patient (initial visit).  Diagnoses and all orders for this visit:  Encounter to establish care Juluis Mire, NP-C will be your  (PCP) she is mastered prepared . Able to diagnosed and treatment also  answer health concern as well as continuing care of varied medical conditions, not limited by cause, organ system, or diagnosis.   Cardiomyopathy due to hypertension, with heart failure [facility]) Following up with cardiology   [facility]  discharge follow-up Retrieved from hospital discharge 05/15/2020 Recommendations for Outpatient Follow-up:  1. Follow up with PCP in 1 week with repeat CBC/BMP (blood work completed) 2. Outpatient follow-up with nephrology and cardiology 3. Compliant with medications and follow-up 4. Follow up in ED if symptoms worsen or new appear    Kerin Perna, NP 2:05 PM

## 2020-06-11 NOTE — Patient Instructions (Signed)
Follow-up Information        [place]. Go to.   Why: Your appointment is for Thursday, June 11, 2020 at 1:50pm please arrive 15 minutes early. Contact information: [place] [address] [place] [facility] Follow up.   Why: Please go to this location for pharmacy assistance for medications. Medications range from $4.00-$10.00 Contact information: [place] [address] [phone]            Rexene Agent, MD. Schedule an appointment as soon as possible for a visit in 1 week(s).   Specialty: Nephrology Contact information: [address] [place] [address] [phone]

## 2020-06-12 ENCOUNTER — Other Ambulatory Visit (HOSPITAL_COMMUNITY): Payer: Self-pay | Admitting: Nephrology

## 2020-06-12 DIAGNOSIS — R809 Proteinuria, unspecified: Secondary | ICD-10-CM

## 2020-06-12 DIAGNOSIS — N179 Acute kidney failure, unspecified: Secondary | ICD-10-CM

## 2020-06-12 DIAGNOSIS — I129 Hypertensive chronic kidney disease with stage 1 through stage 4 chronic kidney disease, or unspecified chronic kidney disease: Secondary | ICD-10-CM

## 2020-06-12 LAB — CBC WITH DIFFERENTIAL/PLATELET
Basophils Absolute: 0.1 10*3/uL (ref 0.0–0.2)
Basos: 1 %
EOS (ABSOLUTE): 0.3 10*3/uL (ref 0.0–0.4)
Eos: 4 %
Hematocrit: 39.3 % (ref 37.5–51.0)
Hemoglobin: 12.3 g/dL — ABNORMAL LOW (ref 13.0–17.7)
Immature Grans (Abs): 0 10*3/uL (ref 0.0–0.1)
Immature Granulocytes: 0 %
Lymphocytes Absolute: 1 10*3/uL (ref 0.7–3.1)
Lymphs: 13 %
MCH: 27 pg (ref 26.6–33.0)
MCHC: 31.3 g/dL — ABNORMAL LOW (ref 31.5–35.7)
MCV: 86 fL (ref 79–97)
Monocytes Absolute: 0.7 10*3/uL (ref 0.1–0.9)
Monocytes: 9 %
Neutrophils Absolute: 5.6 10*3/uL (ref 1.4–7.0)
Neutrophils: 73 %
Platelets: 330 10*3/uL (ref 150–450)
RBC: 4.55 x10E6/uL (ref 4.14–5.80)
RDW: 12.3 % (ref 11.6–15.4)
WBC: 7.8 10*3/uL (ref 3.4–10.8)

## 2020-06-12 LAB — BASIC METABOLIC PANEL
BUN/Creatinine Ratio: 10 (ref 9–20)
BUN: 38 mg/dL — ABNORMAL HIGH (ref 6–24)
CO2: 20 mmol/L (ref 20–29)
Calcium: 9.5 mg/dL (ref 8.7–10.2)
Chloride: 104 mmol/L (ref 96–106)
Creatinine, Ser: 3.7 mg/dL — ABNORMAL HIGH (ref 0.76–1.27)
GFR calc Af Amer: 21 mL/min/{1.73_m2} — ABNORMAL LOW (ref >59)
GFR calc non Af Amer: 18 mL/min/{1.73_m2} — ABNORMAL LOW (ref >59)
Glucose: 75 mg/dL (ref 65–99)
Potassium: 4.4 mmol/L (ref 3.5–5.2)
Sodium: 139 mmol/L (ref 134–144)

## 2020-06-19 ENCOUNTER — Other Ambulatory Visit: Payer: Self-pay

## 2020-06-19 ENCOUNTER — Ambulatory Visit (INDEPENDENT_AMBULATORY_CARE_PROVIDER_SITE_OTHER): Payer: Self-pay | Admitting: Cardiology

## 2020-06-19 ENCOUNTER — Other Ambulatory Visit: Payer: Self-pay | Admitting: Cardiology

## 2020-06-19 ENCOUNTER — Encounter: Payer: Self-pay | Admitting: Cardiology

## 2020-06-19 VITALS — BP 182/104 | HR 80 | Temp 97.3°F | Ht 73.0 in | Wt 196.0 lb

## 2020-06-19 DIAGNOSIS — Z7189 Other specified counseling: Secondary | ICD-10-CM

## 2020-06-19 DIAGNOSIS — I119 Hypertensive heart disease without heart failure: Secondary | ICD-10-CM

## 2020-06-19 DIAGNOSIS — I43 Cardiomyopathy in diseases classified elsewhere: Secondary | ICD-10-CM

## 2020-06-19 DIAGNOSIS — I129 Hypertensive chronic kidney disease with stage 1 through stage 4 chronic kidney disease, or unspecified chronic kidney disease: Secondary | ICD-10-CM

## 2020-06-19 DIAGNOSIS — I10 Essential (primary) hypertension: Secondary | ICD-10-CM

## 2020-06-19 MED ORDER — CLONIDINE HCL 0.1 MG PO TABS: 0.1000 mg | ORAL_TABLET | Freq: Two times a day (BID) | ORAL | 11 refills | Status: DC

## 2020-06-19 MED FILL — cloNIDine HCL 0.1 MG TABS: 0.1 | 30 days supply | Qty: 60 | Fill #0

## 2020-06-19 NOTE — Patient Instructions (Addendum)
Medication Instructions:  Start Clonidine 0.1 mg twice a day  *If you need a refill on your cardiac medications before your next appointment, please call your pharmacy*   Lab Work: None  Testing/Procedures: None   Follow-Up: At [facility], you and your health needs are our priority.  As part of our continuing mission to provide you with exceptional heart care, we have created designated Provider Care Teams.  These Care Teams include your primary Cardiologist (physician) and Advanced Practice Providers (APPs -  Physician Assistants and Nurse Practitioners) who all work together to provide you with the care you need, when you need it.  We recommend signing up for the patient portal called "MyChart".  Sign up information is provided on this After Visit Summary.  MyChart is used to connect with patients for Virtual Visits (Telemedicine).  Patients are able to view lab/test results, encounter notes, upcoming appointments, etc.  Non-urgent messages can be sent to your provider as well.   To learn more about what you can do with MyChart, go to [email or web address].    Your next appointment:   3 month(s)  The format for your next appointment:   In Person  Provider:   Buford Dresser, MD  Your physician recommends that you schedule a follow-up appointment with hypertension clinic ( Dr. Oval Linsey).

## 2020-06-19 NOTE — Progress Notes (Signed)
Cardiology Office Note:    Date:  06/19/2020   ID:  Bobby Stevens, DOB 10-Mar-1971, MRN [id number]  PCP:  Kerin Perna, NP  Cardiologist:  Buford Dresser, MD  Referring MD: No ref. provider found   CC: post hospital follow up  History of Present Illness:    Bobby Stevens is a 49 y.o. male with a hx of hypertensive urgency, cardiomyopathy.  I met him in the hospital 04/2020.   He feels better than he did in the hospital. Has been getting 150/90 on left arm at home, also weighing himself daily. Trying to walk every day. Ready to go back to work. Has seen nephrology.  Denies chest pain, shortness of breath at rest or with normal exertion. No PND, orthopnea, LE edema or unexpected weight gain. No syncope or palpitations.  Blood pressure remains elevated in both arms today. We discussed next steps, see below.  Past Medical History:  Diagnosis Date  . Hypertension 2020    Past Surgical History:  Procedure Laterality Date  . NO PAST SURGERIES    . None      Current Medications: Current Outpatient Medications on File Prior to Visit  Medication Sig  . amLODipine (NORVASC) 10 MG tablet Take 1 tablet (10 mg total) by mouth daily.  . carvedilol (COREG) 25 MG tablet Take 1 tablet (25 mg total) by mouth 2 (two) times daily with a meal.  . hydrALAZINE (APRESOLINE) 100 MG tablet Take 1 tablet (100 mg total) by mouth 3 (three) times daily.  Marland Kitchen spironolactone (ALDACTONE) 50 MG tablet Take 50 mg by mouth daily.  . vitamin B-12 1000 MCG tablet Take 1 tablet (1,000 mcg total) by mouth daily.  . isosorbide mononitrate (IMDUR) 30 MG 24 hr tablet Take 4 tablets (120 mg total) by mouth daily.   No current facility-administered medications on file prior to visit.     Allergies:   Penicillins   Social History   Tobacco Use  . Smoking status: Former Research scientist (life sciences)  . Smokeless tobacco: Never Used  Substance Use Topics  . Alcohol use: Not Currently  . Drug use: Never    Family  History: family history includes Hypertension in his brother, father, maternal grandmother, and mother.  ROS:   Please see the history of present illness.  Additional pertinent ROS otherwise unremarkable.    EKGs/Labs/Other Studies Reviewed:    The following studies were reviewed today: Echo 05/10/20 1. Left ventricular ejection fraction, by estimation, is 25 to 30%. The  left ventricle has severely decreased function. The left ventricle  demonstrates global hypokinesis. There is moderate concentric left  ventricular hypertrophy. Left ventricular  diastolic parameters are consistent with Grade III diastolic dysfunction  (restrictive). Elevated left ventricular end-diastolic pressure.  2. Right ventricular systolic function is mildly reduced. The right  ventricular size is normal. There is moderately elevated pulmonary artery  systolic pressure.  3. Left atrial size was moderately dilated.  4. Right atrial size was mildly dilated.  5. The mitral valve is normal in structure. Mild to moderate mitral valve  regurgitation.  6. Tricuspid valve regurgitation is moderate.  7. The aortic valve is grossly normal. Aortic valve regurgitation is not  visualized. No aortic stenosis is present.  8. Aortic dilatation noted. There is mild to moderate dilatation of the  ascending aorta measuring 43 mm.  9. The inferior vena cava is dilated in size with <50% respiratory  variability, suggesting right atrial pressure of 15 mmHg.   EKG:  EKG is personally  reviewed.  The ekg ordered 05/09/20 demonstrates sinus tach at 115 bpm, LVH with nonspecific changes, biatrial enlargement  Recent Labs: 05/10/2020: B Natriuretic Peptide 1,827.0 05/12/2020: TSH 2.067 05/15/2020: ALT 24; Magnesium 2.1 06/11/2020: BUN 38; Creatinine, Ser 3.70; Hemoglobin 12.3; Platelets 330; Potassium 4.4; Sodium 139  Recent Lipid Panel No results found for: CHOL, TRIG, HDL, CHOLHDL, VLDL, LDLCALC, LDLDIRECT  Physical Exam:      VS:  BP (!) 172/100   Pulse 80   Temp (!) 97.3 F (36.3 C)   Ht 6' 1"  (1.854 m)   Wt 196 lb (88.9 kg)   SpO2 99%   BMI 25.86 kg/m     Wt Readings from Last 3 Encounters:  06/19/20 196 lb (88.9 kg)  06/11/20 196 lb (88.9 kg)  05/15/20 182 lb 12.8 oz (82.9 kg)    GEN: Well nourished, well developed in no acute distress HEENT: Normal, moist mucous membranes NECK: No JVD CARDIAC: regular rhythm, normal S1 and S2, no rubs or gallops. No murmurs. VASCULAR: Radial and DP pulses 2+ bilaterally. No carotid bruits RESPIRATORY:  Clear to auscultation without rales, wheezing or rhonchi  ABDOMEN: Soft, non-tender, non-distended MUSCULOSKELETAL:  Ambulates independently SKIN: Warm and dry, no edema NEUROLOGIC:  Alert and oriented x 3. No focal neuro deficits noted. PSYCHIATRIC:  Normal affect    ASSESSMENT:    1. Cardiomyopathy due to hypertension, without heart failure ([place])   2. Hypertensive nephropathy   3. Severe uncontrolled hypertension   4. Cardiac risk counseling   5. Counseling on health promotion and disease prevention    PLAN:    Severe uncontrolled hypertension Hypertensive cardiomyopathy Hypertensive nephrology -very difficult situation. Despite amlodipine, carvedilol, hydralazine, imdur, and spironolactone, his BP remains very elevated -has had secondary HTN workup: no evidence of renal artery stenosis. Elevated urinary and serum normetanephrine without overall elevated metanephrines. Given that his symptoms are not sporadic in nature, pheochromocytoma seems less likely. Thyroid studies not significantly abnormal. He had normal aldosterone level with an elevated PRA and normal ratio. Has not had significant improvement on spironolactone -he is also following closely with nephrology -given his difficult to control blood pressures on multiple agents, will refer to advanced hypertension clinic for further evaluation -will start clonidine 0.1 mg BID today -limited options  for his cardiomyopathy. On beta blocker, hydralazine, and nitrate. No ACEi/ARB/ARNI given renal function  Cardiac risk counseling and prevention recommendations: -recommend heart healthy/Mediterranean diet, with whole grains, fruits, vegetable, fish, lean meats, nuts, and olive oil. Limit salt. -recommend moderate walking, 3-5 times/week for 30-50 minutes each session. Aim for at least 150 minutes.week. Goal should be pace of 3 miles/hours, or walking 1.5 miles in 30 minutes -recommend avoidance of tobacco products. Avoid excess alcohol. -ASCVD risk score: The ASCVD Risk score Mikey Bussing DC Jr., et al., 2013) failed to calculate for the following reasons:   Cannot find a previous HDL lab   Cannot find a previous total cholesterol lab    Plan for follow up: several weeks with advanced hypertension clinic, then me in 3 mos  Buford Dresser, MD, PhD [place]  [facility]    Medication Adjustments/Labs and Tests Ordered: Current medicines are reviewed at length with the patient today.  Concerns regarding medicines are outlined above.  Orders Placed This Encounter  Procedures  . AMB REFERRAL TO ADVANCED HTN CLINIC   Meds ordered this encounter  Medications  . DISCONTD: cloNIDine (CATAPRES) 0.1 MG tablet    Sig: Take 1 tablet (0.1 mg total) by mouth  2 (two) times daily.    Dispense:  60 tablet    Refill:  11    Patient Instructions  Medication Instructions:  Start Clonidine 0.1 mg twice a day  *If you need a refill on your cardiac medications before your next appointment, please call your pharmacy*   Lab Work: None  Testing/Procedures: None   Follow-Up: At [facility], you and your health needs are our priority.  As part of our continuing mission to provide you with exceptional heart care, we have created designated Provider Care Teams.  These Care Teams include your primary Cardiologist (physician) and Advanced Practice Providers (APPs -  Physician Assistants and  Nurse Practitioners) who all work together to provide you with the care you need, when you need it.  We recommend signing up for the patient portal called "MyChart".  Sign up information is provided on this After Visit Summary.  MyChart is used to connect with patients for Virtual Visits (Telemedicine).  Patients are able to view lab/test results, encounter notes, upcoming appointments, etc.  Non-urgent messages can be sent to your provider as well.   To learn more about what you can do with MyChart, go to [email or web address].    Your next appointment:   3 month(s)  The format for your next appointment:   In Person  Provider:   Buford Dresser, MD  Your physician recommends that you schedule a follow-up appointment with hypertension clinic ( Dr. Oval Linsey).     Signed, Buford Dresser, MD PhD 06/19/2020  [place]

## 2020-06-25 ENCOUNTER — Telehealth: Payer: Self-pay | Admitting: Internal Medicine

## 2020-06-25 ENCOUNTER — Telehealth: Payer: Self-pay | Admitting: *Deleted

## 2020-06-25 NOTE — Telephone Encounter (Signed)
Patient called stating that he has had some very minor left leg swelling and tonight he noticed some minor swelling in his right leg as well. He has been on his feet all day and is not sure if it is from that or related to his medications. He has started on clonidine and not noticed a change in his blood pressure. He is still having pressures in the 150s/90s. Advised him to rest this evening and see if his swelling improves by the morning. We discussed that he is on amlodipine which can cause lower leg swelling but it would be atypical to have this in one leg only. Will let Dr. Harrell Gave know that his blood pressure remains elevated.  Alric Quan, MD Cardiology [place]

## 2020-06-25 NOTE — Telephone Encounter (Signed)
Left message to call back to get ADV HTN CLINIC appointment scheduled

## 2020-07-06 ENCOUNTER — Telehealth: Payer: Self-pay | Admitting: Cardiovascular Disease

## 2020-07-06 NOTE — Telephone Encounter (Signed)
Routed to [facility] LPN

## 2020-07-06 NOTE — Telephone Encounter (Signed)
    Did not need this

## 2020-07-06 NOTE — Telephone Encounter (Signed)
Spoke with patient and scheduled visit for next week Patient aware of date, time, and location

## 2020-07-06 NOTE — Telephone Encounter (Signed)
Follow Up:      Pt is returning Melinda's call from 06-25-20.

## 2020-07-09 MED FILL — AMLODIPINE BESYLATE 10 MG T: 10 | 30 days supply | Qty: 30 | Fill #1

## 2020-07-13 ENCOUNTER — Ambulatory Visit (INDEPENDENT_AMBULATORY_CARE_PROVIDER_SITE_OTHER): Payer: Self-pay | Admitting: Cardiovascular Disease

## 2020-07-13 ENCOUNTER — Other Ambulatory Visit: Payer: Self-pay

## 2020-07-13 ENCOUNTER — Encounter: Payer: Self-pay | Admitting: Cardiovascular Disease

## 2020-07-13 DIAGNOSIS — I10 Essential (primary) hypertension: Secondary | ICD-10-CM

## 2020-07-13 MED ORDER — CLONIDINE HCL 0.2 MG PO TABS: 0.2000 mg | ORAL_TABLET | Freq: Two times a day (BID) | ORAL | 5 refills | Status: DC

## 2020-07-13 MED FILL — ISOSORBIDE MN ER 30 MG TAB: 30 | 30 days supply | Qty: 120 | Fill #1

## 2020-07-13 MED FILL — CARVEDILOL 25 MG TABLET: 25 | 30 days supply | Qty: 60 | Fill #1

## 2020-07-13 MED FILL — cloNIDine HCL 0.2 MG TABS: 0.2 | 30 days supply | Qty: 60 | Fill #0

## 2020-07-13 MED FILL — hydrALAZINE HCL 100 MG TABS: 100 | 30 days supply | Qty: 90 | Fill #1

## 2020-07-13 MED FILL — SPIRONOLACTONE 50 MG TABS: 50 | 30 days supply | Qty: 30 | Fill #1

## 2020-07-13 NOTE — Patient Instructions (Addendum)
Medication Instructions:  INCREASE CLONIDINE TO 0.2 MG TWICE A DAY    Labwork: NONE    Testing/Procedures: NONE   Follow-Up: 08/12/2020 AT 2:00 PM WITH PHARM D   You will receive a phone call from the PREP exercise and nutrition program to schedule an initial assessment.   Special Instructions:   ONE OF THE SOCIAL WORKERS WILL BE IN TOUCH WITH YOU. IF YOU DO NOT HEAR FROM THEM BY THE END OF THE WEEK PLEASE CALL OFFICE TO FOLLOW UP   MONITOR YOUR BLOOD PRESSURE TWICE A DAY, LOG IN BOOK PROVIDED. BRING BOOK AND BLOOD PRESSURE MACHINE TO FOLLOW UP   DASH Eating Plan DASH stands for "Dietary Approaches to Stop Hypertension." The DASH eating plan is a healthy eating plan that has been shown to reduce high blood pressure (hypertension). It may also reduce your risk for type 2 diabetes, heart disease, and stroke. The DASH eating plan may also help with weight loss. What are tips for following this plan?  General guidelines  Avoid eating more than 2,300 mg (milligrams) of salt (sodium) a day. If you have hypertension, you may need to reduce your sodium intake to 1,500 mg a day.  Limit alcohol intake to no more than 1 drink a day for nonpregnant women and 2 drinks a day for men. One drink equals 12 oz of beer, 5 oz of wine, or 1 oz of hard liquor.  Work with your health care provider to maintain a healthy body weight or to lose weight. Ask what an ideal weight is for you.  Get at least 30 minutes of exercise that causes your heart to beat faster (aerobic exercise) most days of the week. Activities may include walking, swimming, or biking.  Work with your health care provider or diet and nutrition specialist (dietitian) to adjust your eating plan to your individual calorie needs. Reading food labels   Check food labels for the amount of sodium per serving. Choose foods with less than 5 percent of the Daily Value of sodium. Generally, foods with less than 300 mg of sodium per serving fit  into this eating plan.  To find whole grains, look for the word "whole" as the first word in the ingredient list. Shopping  Buy products labeled as "low-sodium" or "no salt added."  Buy fresh foods. Avoid canned foods and premade or frozen meals. Cooking  Avoid adding salt when cooking. Use salt-free seasonings or herbs instead of table salt or sea salt. Check with your health care provider or pharmacist before using salt substitutes.  Do not fry foods. Cook foods using healthy methods such as baking, boiling, grilling, and broiling instead.  Cook with heart-healthy oils, such as olive, canola, soybean, or sunflower oil. Meal planning  Eat a balanced diet that includes: ? 5 or more servings of fruits and vegetables each day. At each meal, try to fill half of your plate with fruits and vegetables. ? Up to 6-8 servings of whole grains each day. ? Less than 6 oz of lean meat, poultry, or fish each day. A 3-oz serving of meat is about the same size as a deck of cards. One egg equals 1 oz. ? 2 servings of low-fat dairy each day. ? A serving of nuts, seeds, or beans 5 times each week. ? Heart-healthy fats. Healthy fats called Omega-3 fatty acids are found in foods such as flaxseeds and coldwater fish, like sardines, salmon, and mackerel.  Limit how much you eat of the following: ?  Canned or prepackaged foods. ? Food that is high in trans fat, such as fried foods. ? Food that is high in saturated fat, such as fatty meat. ? Sweets, desserts, sugary drinks, and other foods with added sugar. ? Full-fat dairy products.  Do not salt foods before eating.  Try to eat at least 2 vegetarian meals each week.  Eat more home-cooked food and less restaurant, buffet, and fast food.  When eating at a restaurant, ask that your food be prepared with less salt or no salt, if possible. What foods are recommended? The items listed may not be a complete list. Talk with your dietitian about what dietary  choices are best for you. Grains Whole-grain or whole-wheat bread. Whole-grain or whole-wheat pasta. Brown rice. [place]. Bulgur. Whole-grain and low-sodium cereals. Pita bread. Low-fat, low-sodium crackers. Whole-wheat flour tortillas. Vegetables Fresh or frozen vegetables (raw, steamed, roasted, or grilled). Low-sodium or reduced-sodium tomato and vegetable juice. Low-sodium or reduced-sodium tomato sauce and tomato paste. Low-sodium or reduced-sodium canned vegetables. Fruits All fresh, dried, or frozen fruit. Canned fruit in natural juice (without added sugar). Meat and other protein foods Skinless chicken or [place]. Ground chicken or [place]. Pork with fat trimmed off. Fish and seafood. Egg whites. Dried beans, peas, or lentils. Unsalted nuts, nut butters, and seeds. Unsalted canned beans. Lean cuts of beef with fat trimmed off. Low-sodium, lean deli meat. Dairy Low-fat (1%) or fat-free (skim) milk. Fat-free, low-fat, or reduced-fat cheeses. Nonfat, low-sodium ricotta or cottage cheese. Low-fat or nonfat yogurt. Low-fat, low-sodium cheese. Fats and oils Soft margarine without trans fats. Vegetable oil. Low-fat, reduced-fat, or light mayonnaise and salad dressings (reduced-sodium). Canola, safflower, olive, soybean, and sunflower oils. Avocado. Seasoning and other foods Herbs. Spices. Seasoning mixes without salt. Unsalted popcorn and pretzels. Fat-free sweets. What foods are not recommended? The items listed may not be a complete list. Talk with your dietitian about what dietary choices are best for you. Grains Baked goods made with fat, such as croissants, muffins, or some breads. Dry pasta or rice meal packs. Vegetables Creamed or fried vegetables. Vegetables in a cheese sauce. Regular canned vegetables (not low-sodium or reduced-sodium). Regular canned tomato sauce and paste (not low-sodium or reduced-sodium). Regular tomato and vegetable juice (not low-sodium or reduced-sodium).  Angie Fava. Olives. Fruits Canned fruit in a light or heavy syrup. Fried fruit. Fruit in cream or butter sauce. Meat and other protein foods Fatty cuts of meat. Ribs. Fried meat. Berniece Salines. Sausage. Bologna and other processed lunch meats. Salami. Fatback. Hotdogs. Bratwurst. Salted nuts and seeds. Canned beans with added salt. Canned or smoked fish. Whole eggs or egg yolks. Chicken or [place] with skin. Dairy Whole or 2% milk, cream, and half-and-half. Whole or full-fat cream cheese. Whole-fat or sweetened yogurt. Full-fat cheese. Nondairy creamers. Whipped toppings. Processed cheese and cheese spreads. Fats and oils Butter. Stick margarine. Lard. Shortening. Ghee. Bacon fat. Tropical oils, such as coconut, palm kernel, or palm oil. Seasoning and other foods Salted popcorn and pretzels. Onion salt, garlic salt, seasoned salt, table salt, and sea salt. Worcestershire sauce. Tartar sauce. Barbecue sauce. Teriyaki sauce. Soy sauce, including reduced-sodium. Steak sauce. Canned and packaged gravies. Fish sauce. Oyster sauce. Cocktail sauce. Horseradish that you find on the shelf. Ketchup. Mustard. Meat flavorings and tenderizers. Bouillon cubes. Hot sauce and Tabasco sauce. Premade or packaged marinades. Premade or packaged taco seasonings. Relishes. Regular salad dressings. Where to find more information:  National Heart, Lung, and [place]: https://wilson-eaton.com/  American Heart Association: www.heart.org Summary  The DASH eating plan is a healthy eating plan that has been shown to reduce high blood pressure (hypertension). It may also reduce your risk for type 2 diabetes, heart disease, and stroke.  With the DASH eating plan, you should limit salt (sodium) intake to 2,300 mg a day. If you have hypertension, you may need to reduce your sodium intake to 1,500 mg a day.  When on the DASH eating plan, aim to eat more fresh fruits and vegetables, whole grains, lean proteins, low-fat dairy, and  heart-healthy fats.  Work with your health care provider or diet and nutrition specialist (dietitian) to adjust your eating plan to your individual calorie needs. This information is not intended to replace advice given to you by your health care provider. Make sure you discuss any questions you have with your health care provider. Document Released: 09/22/2011 Document Revised: 09/15/2017 Document Reviewed: 09/26/2016 Elsevier Patient Education  2020 [employer].

## 2020-07-13 NOTE — Progress Notes (Signed)
Hypertension Clinic Initial Assessment:    Date:  07/13/2020   ID:  Bobby Stevens, DOB [birth year], MRN [id number]  PCP:  Kerin Perna, NP  Cardiologist:  Buford Dresser, MD  Nephrologist:  Referring MD: Kerin Perna, NP   CC: Hypertension  History of Present Illness:    Bobby Stevens is a 50 y.o. male with a hx of chronic systolic and diastolic heart failure, noncompliance, CKD IV, and hypertension here to establish care in the hypertension clinic.  He reports that he was first diagnosed with hypertension in around 2006.  It was initially well-controlled on lisinopril and hydrochlorothiazide.  However he had difficulty affording his medication and was off of it for at least the last 6 months.  Bobby Stevens was hospitalized 04/2020 with hypertensive emergency and acute renal failure.Marland Kitchen  His blood pressure on presentation was 246/181.  Echo that admission revealed LVEF 25 to 30% with global hypokinesis.  Lisinopril and HCTZ were discontinued.  Both serum and plasma metanephrines were notable for elevated normetanephrine.  He denies tachycardia or diaphoresis.  Labs were also concerning for hyperaldosteronism. Bobby Stevens followed up with Dr. Harrell Gave on 9/3 and his BP was.  Clonidine was added to his regimen.  He initially felt very sleepy but this seems to be improving.  When he checks his blood pressure at home now it is typically running in the 086P to 619J systolic.  He reports taking all his medications as prescribed.  He rarely misses them.  He has been trying to walk 1 mile daily for exercise and has no exertional chest pain or shortness of breath.  He denies lower extremity edema, orthopnea, or PND.  He previously had some lower extremity edema but that has improved.  He notes that his diet is poor.  He eats a lot of pizza and fast food.  He is able to cook.  He does not have caffeine and mostly drinks water.  Previously he took ibuprofen but has not been doing so  anymore.  He does not drink any alcohol and no longer uses any supplements other than B12.  Prior to his recent hospitalization he was working as a Corporate treasurer.  He is unable to do this now because he had to lift heavy packages and has been restricted from doing so.  This has been a financial strain.  Previous antihypertensives: Lisinopril HCTZ   Past Medical History:  Diagnosis Date  . Hypertension 2020    Past Surgical History:  Procedure Laterality Date  . NO PAST SURGERIES    . None      Current Medications: Current Meds  Medication Sig  . amLODipine (NORVASC) 10 MG tablet Take 1 tablet (10 mg total) by mouth daily.  . carvedilol (COREG) 25 MG tablet Take 1 tablet (25 mg total) by mouth 2 (two) times daily with a meal.  . cloNIDine (CATAPRES) 0.2 MG tablet Take 1 tablet (0.2 mg total) by mouth 2 (two) times daily.  . hydrALAZINE (APRESOLINE) 100 MG tablet Take 1 tablet (100 mg total) by mouth 3 (three) times daily.  Marland Kitchen spironolactone (ALDACTONE) 50 MG tablet Take 50 mg by mouth daily.  . vitamin B-12 1000 MCG tablet Take 1 tablet (1,000 mcg total) by mouth daily.  . [DISCONTINUED] cloNIDine (CATAPRES) 0.1 MG tablet Take 1 tablet (0.1 mg total) by mouth 2 (two) times daily.     Allergies:   Penicillins   Social History   Socioeconomic History  . Marital status: Single  Spouse name: Not on file  . Number of children: Not on file  . Years of education: Not on file  . Highest education level: Not on file  Occupational History  . Occupation: Automotive engineer: [employer]  Tobacco Use  . Smoking status: Former Research scientist (life sciences)  . Smokeless tobacco: Never Used  Substance and Sexual Activity  . Alcohol use: Not Currently  . Drug use: Never  . Sexual activity: Not Currently  Other Topics Concern  . Not on file  Social History Narrative   Pt lives w/ mother   Social Determinants of Health   Financial Resource Strain:   . Difficulty of Paying Living Expenses:  Not on file  Food Insecurity:   . Worried About Charity fundraiser in the Last Year: Not on file  . Ran Out of Food in the Last Year: Not on file  Transportation Needs:   . Lack of Transportation (Medical): Not on file  . Lack of Transportation (Non-Medical): Not on file  Physical Activity:   . Days of Exercise per Week: Not on file  . Minutes of Exercise per Session: Not on file  Stress:   . Feeling of Stress : Not on file  Social Connections:   . Frequency of Communication with Friends and Family: Not on file  . Frequency of Social Gatherings with Friends and Family: Not on file  . Attends Religious Services: Not on file  . Active Member of Clubs or Organizations: Not on file  . Attends Archivist Meetings: Not on file  . Marital Status: Not on file     Family History: The patient's family history includes Hypertension in his brother, father, maternal grandmother, mother, paternal grandmother, and paternal uncle.  ROS:   Please see the history of present illness.    All other systems reviewed and are negative.  EKGs/Labs/Other Studies Reviewed:    EKG:  EKG is not ordered today.  The ekg ordered 05/09/20 demonstrates sinus tachycardia.  Rate 115 bpm.  LVH with repolarization abnormalities.  Biatrial enlargement.    Recent Labs: 05/10/2020: B Natriuretic Peptide 1,827.0 05/12/2020: TSH 2.067 05/15/2020: ALT 24; Magnesium 2.1 06/11/2020: BUN 38; Creatinine, Ser 3.70; Hemoglobin 12.3; Platelets 330; Potassium 4.4; Sodium 139   Recent Lipid Panel No results found for: CHOL, TRIG, HDL, CHOLHDL, VLDL, LDLCALC, LDLDIRECT   Echo 05/10/20: 1. Left ventricular ejection fraction, by estimation, is 25 to 30%. The  left ventricle has severely decreased function. The left ventricle  demonstrates global hypokinesis. There is moderate concentric left  ventricular hypertrophy. Left ventricular  diastolic parameters are consistent with Grade III diastolic dysfunction   (restrictive). Elevated left ventricular end-diastolic pressure.  2. Right ventricular systolic function is mildly reduced. The right  ventricular size is normal. There is moderately elevated pulmonary artery  systolic pressure.  3. Left atrial size was moderately dilated.  4. Right atrial size was mildly dilated.  5. The mitral valve is normal in structure. Mild to moderate mitral valve  regurgitation.  6. Tricuspid valve regurgitation is moderate.  7. The aortic valve is grossly normal. Aortic valve regurgitation is not  visualized. No aortic stenosis is present.  8. Aortic dilatation noted. There is mild to moderate dilatation of the  ascending aorta measuring 43 mm.  9. The inferior vena cava is dilated in size with <50% respiratory  variability, suggesting right atrial pressure of 15 mmHg.   Physical Exam:    VS:  BP (!) 180/94  Pulse 81   Ht 6\' 1"  (1.854 m)   Wt 198 lb 12.8 oz (90.2 kg)   SpO2 99%   BMI 26.23 kg/m  , BMI Body mass index is 26.23 kg/m. GENERAL:  Well appearing HEENT: Pupils equal round and reactive, fundi not visualized, oral mucosa unremarkable NECK:  No jugular venous distention, waveform within normal limits, carotid upstroke brisk and symmetric, no bruits LUNGS:  Clear to auscultation bilaterally HEART:  RRR.  PMI not displaced or sustained,S1 and S2 within normal limits, no S3, no S4, no clicks, no rubs, no murmurs ABD:  Flat, positive bowel sounds normal in frequency in pitch, no bruits, no rebound, no guarding, no midline pulsatile mass, no hepatomegaly, no splenomegaly EXT:  2 plus pulses throughout, no edema, no cyanosis no clubbing SKIN:  No rashes no nodules NEURO:  Cranial nerves II through XII grossly intact, motor grossly intact throughout PSYCH:  Cognitively intact, oriented to person place and time   ASSESSMENT:    1. Severe uncontrolled hypertension     PLAN:    # Resistant hypertension:  Bobby Stevens has difficult to  control hypertension.  He is currently taking his medications as prescribed and his blood pressure remains elevated both initially and on repeat, though it did improve somewhat on repeat.  His blood pressure is symmetric in both arms.  He had a thorough assessment of secondary causes while in the hospital and there was some concern for both hyperaldosteronism and pheochromocytoma.  He has no symptoms of a pheochromocytoma.  Unfortunately given his CKD 4 we are unable to get either a contrast-enhanced MRI or CT to evaluate for an adrenal adenoma.  He is supposed to be getting blood work with his nephrologist next week.  Hopefully as his renal function improves this can be considered.  Given his lack of improvement on spironolactone 50 mg and lack of symptoms for pheochromocytoma, both of these diagnoses seem less likely.  Continue amlodipine, carvedilol, clonidine, hydralazine, Imdur, and spironolactone.  We will increase the clonidine to 0.2 mg twice daily.  He will track his blood pressures twice daily and bring this to follow-up.  We will also refer Bobby Stevens to the prep exercise and nutrition program through the [facility].  Additionally given his financial constraints we will contact our social worker to see if there is anything we can do to assist.  He understands to avoid NSAIDs given his hypertension and CKD.  Secondary Causes of Hypertension  Medications/Herbal: OCP, steroids, stimulants, antidepressants, weight loss medication, immune suppressants, NSAIDs, sympathomimetics, alcohol, caffeine, licorice, ginseng, St. John's wort, chemo Sleep Apnea: minimal symptoms.   Renal artery stenosis: Renal artery Dopplers - 04/2020 Hyperaldosteronism: Positive 04/2020 Hyper/hypothyroidism: TSH normal 04/2020 Pheochromocytoma: Positive normetanephrines plasma and urine 04/2020 Cushing's syndrome: (testing not indicated)  Coarctation of the aorta (testing not indicated)     Disposition:    FU with MD/PharmD in 1 month     Medication Adjustments/Labs and Tests Ordered: Current medicines are reviewed at length with the patient today.  Concerns regarding medicines are outlined above.  No orders of the defined types were placed in this encounter.  Meds ordered this encounter  Medications  . cloNIDine (CATAPRES) 0.2 MG tablet    Sig: Take 1 tablet (0.2 mg total) by mouth 2 (two) times daily.    Dispense:  60 tablet    Refill:  5    NEW DOSE, D/C 0.1 MG RX     Signed, Skeet Latch, MD  07/13/2020 2:03 PM  [facility]

## 2020-07-14 ENCOUNTER — Telehealth: Payer: Self-pay | Admitting: Licensed Clinical Social Worker

## 2020-07-14 NOTE — Telephone Encounter (Signed)
CSW received referral to assist patient with financial resources. CSW left message for return call. Raquel Sarna, [place], [place]

## 2020-07-17 ENCOUNTER — Telehealth: Payer: Self-pay | Admitting: Licensed Clinical Social Worker

## 2020-07-17 NOTE — Telephone Encounter (Signed)
CSW contacted patient to provide financial resources. No answer and message left for return call. Bobby Stevens, [place], [place]

## 2020-07-22 ENCOUNTER — Telehealth: Payer: Self-pay

## 2020-07-22 NOTE — Telephone Encounter (Signed)
LVMT requesting call back to discuss referral to PREP 

## 2020-08-05 ENCOUNTER — Telehealth: Payer: Self-pay

## 2020-08-05 NOTE — Telephone Encounter (Signed)
lmom to reschedule due to provider emergency

## 2020-08-10 MED FILL — ISOSORBIDE MN ER 30 MG TAB: 30 | 30 days supply | Qty: 120 | Fill #2

## 2020-08-10 MED FILL — hydrALAZINE HCL 100 MG TABS: 100 | 30 days supply | Qty: 90 | Fill #2

## 2020-08-10 MED FILL — AMLODIPINE BESYLATE 10 MG T: 10 | 30 days supply | Qty: 30 | Fill #2

## 2020-08-10 MED FILL — SPIRONOLACTONE 50 MG TABS: 50 | 30 days supply | Qty: 30 | Fill #2

## 2020-08-10 MED FILL — CARVEDILOL 25 MG TABLET: 25 | 30 days supply | Qty: 60 | Fill #2

## 2020-08-12 ENCOUNTER — Ambulatory Visit: Payer: Self-pay

## 2020-08-16 ENCOUNTER — Encounter: Payer: Self-pay | Admitting: Cardiology

## 2020-08-28 ENCOUNTER — Ambulatory Visit (INDEPENDENT_AMBULATORY_CARE_PROVIDER_SITE_OTHER): Payer: BLUE CROSS/BLUE SHIELD | Admitting: Pharmacist Clinician (PhC)/ Clinical Pharmacy Specialist

## 2020-08-28 ENCOUNTER — Other Ambulatory Visit: Payer: Self-pay

## 2020-08-28 VITALS — BP 152/96 | HR 70 | Ht 73.0 in | Wt 202.4 lb

## 2020-08-28 DIAGNOSIS — I10 Essential (primary) hypertension: Secondary | ICD-10-CM

## 2020-08-28 LAB — BASIC METABOLIC PANEL
BUN/Creatinine Ratio: 12 (ref 9–20)
BUN: 47 mg/dL — ABNORMAL HIGH (ref 6–24)
CO2: 23 mmol/L (ref 20–29)
Calcium: 9.8 mg/dL (ref 8.7–10.2)
Chloride: 101 mmol/L (ref 96–106)
Creatinine, Ser: 4.01 mg/dL — ABNORMAL HIGH (ref 0.76–1.27)
GFR calc Af Amer: 19 mL/min/{1.73_m2} — ABNORMAL LOW (ref >59)
GFR calc non Af Amer: 16 mL/min/{1.73_m2} — ABNORMAL LOW (ref >59)
Glucose: 90 mg/dL (ref 65–99)
Potassium: 4.8 mmol/L (ref 3.5–5.2)
Sodium: 136 mmol/L (ref 134–144)

## 2020-08-28 NOTE — Progress Notes (Signed)
Heart and Vascular Care Navigation  08/28/2020  Bobby Stevens [birth year] [id number]  Reason for Referral:  Ensure pt can afford medications/coverage questions.                                                                                                     Assessment:      CSW met with pt and [name] [phone]) in pharmacy clinic. Introduced self, role, reason for visit. Pt from home w/ fiance. He confirms home address, phone number and contact information is accurate. Pt confirms he has been seeing Bobby Mire, NP in clinic at [facility] and getting his medications filled e. He states he has been able to afford his medications since filling them at [facility]. They usually run him about $40. I explained that if he ever finds that he has challenges affording his medications to call our office and let us know so we can see how best to help/connect him with resources.   Pt confirms no challenges at this time obtaining food, no outstanding utility/rental payments or major issues currently, he is able to get to his appointments. Again encouraged pt/pt fiance to call and let us know if this does become a challenge.   He has applied for disability, states some delays and mixed lines with getting through to [facility]. He missed some of their calls due to thinking they were spam. CSW explained this has been a common issue for patients as the [facility] staff is working remotely due to [place]. Pt plans to reach out to them to see where he is in the process/assess any additional documents that he may need.   Pt has a current BCBS plan from the [employer] which is a "non participating plan." Since it is open enrollment time I showed pt where the customer service number is on his card and shared that he needs to call and let them know that his providers are all with [place] to get his plan to be one in network. I offered to call in a few weeks to see if he was able to do  this.   Pt did mention during my visit that sometimes he does get overwhelmed by all the things that his medical providers are explaining to him. I provided reassurance that this is understandable; I encouraged him, and his fiance, that if there ever is a time that he does not understand what a provider is sharing with him that he should ask if they can explain again or if there is another way they can break it down. I shared that to ensure he is able to take care of himself understanding what he is monitoring is paramount. I also encouraged him to take any hand outs to review after the appointment. Pt shares appreciation for all the assistance that this clinic provides him.                                  HRT/VAS Care Coordination    Patients  Home Cardiology Office [place] Team Social Worker   Social Worker Name: Bobby Stevens [phone]   Living arrangements for the past 2 months Single Family Home   Lives with: Parents   Patient Current Insurance Coverage Commercial Insurance   Does Patient Have Prescription Coverage? Yes   Home Assistive Devices/Equipment None   DME Agency NA   HH Agency NA      Social History:                                                                             SDOH Screenings   Alcohol Screen:   . Last Alcohol Screening Score (AUDIT): Not on file  Depression (PHQ2-9): Low Risk   . PHQ-2 Score: 0  Financial Resource Strain: Low Risk   . Difficulty of Paying Living Expenses: Not very hard  Food Insecurity: No Food Insecurity  . Worried About Charity fundraiser in the Last Year: Never true  . Ran Out of Food in the Last Year: Never true  Housing: Low Risk   . Last Housing Risk Score: 0  Physical Activity:   . Days of Exercise per Week: Not on file  . Minutes of Exercise per Session: Not on file  Social Connections:   . Frequency of Communication with Friends and Family: Not on file  .  Frequency of Social Gatherings with Friends and Family: Not on file  . Attends Religious Services: Not on file  . Active Member of Clubs or Organizations: Not on file  . Attends Archivist Meetings: Not on file  . Marital Status: Not on file  Stress:   . Feeling of Stress : Not on file  Tobacco Use: Medium Risk  . Smoking Tobacco Use: Former Smoker  . Smokeless Tobacco Use: Never Used  Transportation Needs: No Transportation Needs  . Lack of Transportation (Medical): No  . Lack of Transportation (Non-Medical): No    SDOH Interventions: Financial Resources:  Financial Strain Interventions: Other (Comment) Retail buyer and [place])   Food Insecurity:  Food Insecurity Interventions: Intervention Not Indicated  Housing Insecurity:  Housing Interventions: Intervention Not Indicated  Transportation:   Transportation Interventions: Intervention Not Indicated    Other Care Navigation Interventions:     Provided Pharmacy assistance resources  Pt currently getting his medications at [facility], no concerns however he   Patient Referred to: n/a   Follow-up plan:   CSW will follow up with pt again in the next few weeks to ensure he has been able to contact [place] and to see if any additional needs have arisen.

## 2020-08-28 NOTE — Progress Notes (Signed)
09/01/2020 Bobby Stevens [birth year] [id number]   HPI:  Bobby Stevens is a 49 y.o. male patient of Dr Oval Linsey, with a [place] below who presents today for advanced  hypertension clinic follow up. He was seen by Dr. Oval Linsey in September and found to have a blood pressure of 180/94.  He noted that he first was diagnosed with hypertension around the age of 56.  He was initially well controlled on lisinopril and hctz, however he had difficulties affording medications.  He was hospitalized in July of this year for hypertensive emergency, with a pressure of 246/181.  He was tested for both pheochromocytoma and hyperaldosteronism during admission.  While plasma renin was elevated, he has not significantly responded to spironolactone, so diagnosis of hyperaldosteronism unlikely.  Renal artery doppler showed no evidence of stenosis.    Because of financial constraints social work was contacted after his last visit, but they were never able to reach him.  He was also recommended to the PREP exercise group, but has not heard from them . Chart indicates both groups tried to reach out to him but received no answer.    He is feeling well today overall.  He notes occasional nausea in the mornings, and a feeling of being "off balance".  This lasts only for a few minutes at the most, then he is fine the remainder of the day.   States compliant with taking medications daily.   Blood Pressure Goal:  130/80  Current Medications: amlodipine 10 mg qd am, carvedilol 25 mg bid, clonidine 0.2 mg bid, hydralazine 100 mg tid, spironolactone 50 mg qd am, isosorbide mono 120 mg qd am  Family Hx: father w/hypertension, died from MI at 20; mom now 26 hypertension; grandmother 4 (mgm), hypertension, in better shape than most of family; children 38,25,17,21,16 - no problems; older brother with hypertension  Social Hx: no tobacco, no alcohol, cut out coffee recently  Diet: eats lots of take out, some home cooked, no salt  added; has cut out sandwich meat; looks for low sodium when eating out  Exercise: walks about 1 mile most days; also is a drummer in band   Home BP readings:  No home readings with him today  Intolerances: penicillin  Labs: 8/21: Na 136, K 4.8, Glu 90, BUN 47, SCr 4.01 GFR 19  Wt Readings from Last 3 Encounters:  08/28/20 202 lb 6.4 oz (91.8 kg)  07/13/20 198 lb 12.8 oz (90.2 kg)  06/19/20 196 lb (88.9 kg)   BP Readings from Last 3 Encounters:  08/28/20 (!) 152/96  07/13/20 (!) 180/94  06/19/20 (!) 182/104   Pulse Readings from Last 3 Encounters:  08/28/20 70  07/13/20 81  06/19/20 80    Current Outpatient Medications  Medication Sig Dispense Refill  . amLODipine (NORVASC) 10 MG tablet Take 1 tablet (10 mg total) by mouth daily. 30 tablet 0  . carvedilol (COREG) 25 MG tablet Take 1 tablet (25 mg total) by mouth 2 (two) times daily with a meal. 60 tablet 0  . cloNIDine (CATAPRES) 0.2 MG tablet Take 1 tablet (0.2 mg total) by mouth 2 (two) times daily. 60 tablet 5  . hydrALAZINE (APRESOLINE) 100 MG tablet Take 1 tablet (100 mg total) by mouth 3 (three) times daily. 90 tablet 0  . isosorbide mononitrate (IMDUR) 30 MG 24 hr tablet Take 4 tablets (120 mg total) by mouth daily. 120 tablet 0  . spironolactone (ALDACTONE) 50 MG tablet Take 50 mg by mouth daily.    Marland Kitchen  vitamin B-12 1000 MCG tablet Take 1 tablet (1,000 mcg total) by mouth daily. 30 tablet 0   No current facility-administered medications for this visit.    Allergies  Allergen Reactions  . Penicillins Rash    Past Medical History:  Diagnosis Date  . Hypertension 2020    Blood pressure (!) 152/96, pulse 70, height 6\' 1"  (1.854 m), weight 202 lb 6.4 oz (91.8 kg).  Hypertension Patient with uncontrolled systolic/disatolic hypertension, currently not at BP goal.  Because of CKD we are somewhat limited in medication options.  For now we will move the amlodipine to bedtime and have him increase the clonidine to 0.2 mg  three times daily.  I will have the social worker Westley Hummer) meet with him today to be sure he is getting any help available.  Will also have him go to the lab so we can check kidney function to see if any improvement since his hospital follow up in August.     Beyonca Wisz PharmD CPP [facility] [address] [place], [place] [address] [phone]

## 2020-08-28 NOTE — Patient Instructions (Signed)
Return for a a follow up appointment with Dr. Harrell Gave in December  Go to the lab today to check kidney function  Check your blood pressure at home twice daily and keep record of the readings.  Take your BP meds as follows:  Switch amlodipine to bedtime starting tomorrow  Increase clonidine to three times daily  Continue with all other medications  Bring all of your meds, your BP cuff and your record of home blood pressures to your next appointment.  Exercise as you're able, try to walk approximately 30 minutes per day.  Keep salt intake to a minimum, especially watch canned and prepared boxed foods.  Eat more fresh fruits and vegetables and fewer canned items.  Avoid eating in fast food restaurants.    HOW TO TAKE YOUR BLOOD PRESSURE: . Rest 5 minutes before taking your blood pressure. .  Don't smoke or drink caffeinated beverages for at least 30 minutes before. . Take your blood pressure before (not after) you eat. . Sit comfortably with your back supported and both feet on the floor (don't cross your legs). . Elevate your arm to heart level on a table or a desk. . Use the proper sized cuff. It should fit smoothly and snugly around your bare upper arm. There should be enough room to slip a fingertip under the cuff. The bottom edge of the cuff should be 1 inch above the crease of the elbow. . Ideally, take 3 measurements at one sitting and record the average.

## 2020-08-31 MED FILL — cloNIDine HCL 0.1 MG TABS: 0.1 | 30 days supply | Qty: 60 | Fill #1

## 2020-09-01 ENCOUNTER — Encounter: Payer: Self-pay | Admitting: Pharmacist Clinician (PhC)/ Clinical Pharmacy Specialist

## 2020-09-01 NOTE — Assessment & Plan Note (Signed)
Patient with uncontrolled systolic/disatolic hypertension, currently not at BP goal.  Because of CKD we are somewhat limited in medication options.  For now we will move the amlodipine to bedtime and have him increase the clonidine to 0.2 mg three times daily.  I will have the social worker Westley Hummer) meet with him today to be sure he is getting any help available.  Will also have him go to the lab so we can check kidney function to see if any improvement since his hospital follow up in August.

## 2020-09-03 ENCOUNTER — Telehealth: Payer: Self-pay | Admitting: Pharmacist Clinician (PhC)/ Clinical Pharmacy Specialist

## 2020-09-03 NOTE — Telephone Encounter (Signed)
Patient labs checked in office Friday Nov 12.  SCr increased from 3.8 to 4.  Reviewed with nephrology - see their answer below.     I'm one of the nephrologists taking care of Bobby Stevens, I had seen him on 11/10 and his Cr was looking relatively okay with a Cr of 3.8 with a GFR of 17, so I kept his spironolactone on. I would be okay with holding on his spironolactone for now given his most recent blood work and to preserve eGFR. Fortunately, his blood pressure at the time of his visit was doing great (120/90).  Thanks!   -[name]   Will hold spironolactone for now.

## 2020-09-03 NOTE — Telephone Encounter (Signed)
Pt called in returning call.    Best cb number is [phone]

## 2020-09-04 ENCOUNTER — Telehealth: Payer: Self-pay | Admitting: Cardiology

## 2020-09-04 NOTE — Telephone Encounter (Signed)
lmomed to stop spironalactone and encouraged the pt to call if they have any questions

## 2020-09-04 NOTE — Telephone Encounter (Signed)
Could you please reach out to him and ask him to stop the spironolactone.  Reviewed most recent labs (kidney function decreased slightly from last labs) with nephrologist and agree that he should hold this for now.  Nephrology will continue to monitor kidney function.  Thanks,

## 2020-09-04 NOTE — Telephone Encounter (Signed)
Patient is returning call. He states he would like to further discuss. Please call.

## 2020-09-04 NOTE — Telephone Encounter (Signed)
Pt c/o BP issue: STAT if pt c/o blurred vision, one-sided weakness or slurred speech  1. What are your last 5 BP readings?  116/65  2. Are you having any other symptoms (ex. Dizziness, headache, blurred vision, passed out)? No   3. What is your BP issue? Patient states today he BP dropped and he is wondering if she should be concerned. Please call.

## 2020-09-04 NOTE — Telephone Encounter (Signed)
Spoke with pt and feels fine Just wanted to make sure B/P of 116/65 was good reading Informed pt this was good and to continue to monitor Instructed if B/P continues to drop or notes dizziness or fatigue to call back Pt verbalized understanding Pt did note a episode upon standing did feel a little ( woozy) Encouraged to take time with position changes .Will forward to Dr Harrell Gave for review /cy

## 2020-09-04 NOTE — Telephone Encounter (Signed)
lmomed to stop spironalactone based on kidney fx. Will await callback

## 2020-09-07 NOTE — Telephone Encounter (Signed)
Agree with recommendations as noted, acceptable blood pressure, should change position slowly. Has upcoming appt with me.

## 2020-09-07 NOTE — Telephone Encounter (Signed)
Left message to call back  

## 2020-09-14 ENCOUNTER — Other Ambulatory Visit: Payer: Self-pay | Admitting: Nephrology

## 2020-09-14 MED FILL — CARVEDILOL 25 MG TABLET: 25 | 30 days supply | Qty: 60 | Fill #3

## 2020-09-14 MED FILL — hydrALAZINE HCL 100 MG TABS: 100 | 30 days supply | Qty: 90 | Fill #3

## 2020-09-14 MED FILL — ISOSORBIDE MN ER 30 MG TAB: 30 | 30 days supply | Qty: 120 | Fill #0

## 2020-09-14 MED FILL — AMLODIPINE BESYLATE 10 MG T: 10 | 30 days supply | Qty: 30 | Fill #3

## 2020-09-15 NOTE — Telephone Encounter (Signed)
Attempted to contact pt x 2. Left message to call back 

## 2020-09-17 NOTE — Telephone Encounter (Signed)
Pt has an appointment scheduled for 12/3 with Dr. Harrell Gave.

## 2020-09-18 ENCOUNTER — Ambulatory Visit (INDEPENDENT_AMBULATORY_CARE_PROVIDER_SITE_OTHER): Payer: BLUE CROSS/BLUE SHIELD | Admitting: Cardiology

## 2020-09-18 ENCOUNTER — Other Ambulatory Visit: Payer: Self-pay

## 2020-09-18 ENCOUNTER — Encounter: Payer: Self-pay | Admitting: Cardiology

## 2020-09-18 VITALS — BP 134/88 | HR 75 | Ht 73.0 in | Wt 210.8 lb

## 2020-09-18 DIAGNOSIS — I119 Hypertensive heart disease without heart failure: Secondary | ICD-10-CM

## 2020-09-18 DIAGNOSIS — I129 Hypertensive chronic kidney disease with stage 1 through stage 4 chronic kidney disease, or unspecified chronic kidney disease: Secondary | ICD-10-CM

## 2020-09-18 DIAGNOSIS — I10 Essential (primary) hypertension: Secondary | ICD-10-CM

## 2020-09-18 DIAGNOSIS — I43 Cardiomyopathy in diseases classified elsewhere: Secondary | ICD-10-CM

## 2020-09-18 DIAGNOSIS — Z7189 Other specified counseling: Secondary | ICD-10-CM

## 2020-09-18 MED ORDER — ISOSORBIDE MONONITRATE ER 120 MG PO TB24: 120.0000 mg | ORAL_TABLET | Freq: Every day | ORAL | 3 refills | Status: DC

## 2020-09-18 NOTE — Patient Instructions (Addendum)
Medication Instructions:  Your Physician recommend you continue on your current medication as directed.    *If you need a refill on your cardiac medications before your next appointment, please call your pharmacy*   Lab Work: None   Testing/Procedures: None   Follow-Up: At [facility], you and your health needs are our priority.  As part of our continuing mission to provide you with exceptional heart care, we have created designated Provider Care Teams.  These Care Teams include your primary Cardiologist (physician) and Advanced Practice Providers (APPs -  Physician Assistants and Nurse Practitioners) who all work together to provide you with the care you need, when you need it.  We recommend signing up for the patient portal called "MyChart".  Sign up information is provided on this After Visit Summary.  MyChart is used to connect with patients for Virtual Visits (Telemedicine).  Patients are able to view lab/test results, encounter notes, upcoming appointments, etc.  Non-urgent messages can be sent to your provider as well.   To learn more about what you can do with MyChart, go to [email or web address].    Your next appointment:   3 month(s)  The format for your next appointment:   In Person  Provider:   Buford Dresser, MD   Other Instructions Try compression stockings (knee high). I like Dr. Katy Fitch, can get at department stores or online. Put them on first thing in the morning and take off at bedtime.

## 2020-09-18 NOTE — Progress Notes (Signed)
Cardiology Office Note:    Date:  09/18/2020   ID:  Calistro Rauf, DOB 14-Apr-1971, MRN [id number]  PCP:  Kerin Perna, NP  Cardiologist:  Buford Dresser, MD  Referring MD: Kerin Perna, NP   CC: post hospital follow up  History of Present Illness:    Bobby Stevens is a 49 y.o. male with a hx of hypertensive urgency, cardiomyopathy.  I met him in the hospital 04/2020.   Today: Has gotten in a better place emotionally with his health. Has noted more leg swelling 12/1. Thinks his weight has been stable. We reviewed watching salt. Did splurge and have Brendolyn Patty   Takes blood pressure four times/day, weighs daily. Walking a mile/day. Discussed importance of being vigilant vs. Obsessive about managing his health.   Rare lightheadedness when he first wakes up, manageable. Does not notice this any other time of the day.   We reviewed his medications at length today. He was on spironolactone 50 mg daily, and this was stopped several weeks ago when kidney function decreased. He is taking amlodipine 10 mg daily, carvedilol 25 mg BID, clonidine 0.2 mg three times/day (written as twice a day), hydralazine 100 mg three times/day. Was taking imdur 30 mg four times/day, will consolidate to 120 mg once daily today.  Past Medical History:  Diagnosis Date   Hypertension 2020    Past Surgical History:  Procedure Laterality Date   NO PAST SURGERIES     None      Current Medications: Current Outpatient Medications on File Prior to Visit  Medication Sig   amLODipine (NORVASC) 10 MG tablet Take 1 tablet (10 mg total) by mouth daily.   carvedilol (COREG) 25 MG tablet Take 1 tablet (25 mg total) by mouth 2 (two) times daily with a meal.   cloNIDine (CATAPRES) 0.2 MG tablet Take 1 tablet (0.2 mg total) by mouth 2 (two) times daily. (Patient taking differently: Take 0.2 mg by mouth 3 (three) times daily. )   hydrALAZINE (APRESOLINE) 100 MG tablet Take 1 tablet (100 mg  total) by mouth 3 (three) times daily.   vitamin B-12 1000 MCG tablet Take 1 tablet (1,000 mcg total) by mouth daily. (Patient taking differently: Take 500 mcg by mouth daily. )   isosorbide mononitrate (IMDUR) 30 MG 24 hr tablet Take 4 tablets (120 mg total) by mouth daily.   No current facility-administered medications on file prior to visit.     Allergies:   Penicillins   Social History   Tobacco Use   Smoking status: Former Smoker   Smokeless tobacco: Never Used  Substance Use Topics   Alcohol use: Not Currently   Drug use: Never    Family History: family history includes Hypertension in his brother, father, maternal grandmother, mother, paternal grandmother, and paternal uncle.  ROS:   Please see the history of present illness.  Additional pertinent ROS otherwise unremarkable.    EKGs/Labs/Other Studies Reviewed:    The following studies were reviewed today: Echo 05/10/20 1. Left ventricular ejection fraction, by estimation, is 25 to 30%. The  left ventricle has severely decreased function. The left ventricle  demonstrates global hypokinesis. There is moderate concentric left  ventricular hypertrophy. Left ventricular  diastolic parameters are consistent with Grade III diastolic dysfunction  (restrictive). Elevated left ventricular end-diastolic pressure.   2. Right ventricular systolic function is mildly reduced. The right  ventricular size is normal. There is moderately elevated pulmonary artery  systolic pressure.   3. Left atrial size  was moderately dilated.   4. Right atrial size was mildly dilated.   5. The mitral valve is normal in structure. Mild to moderate mitral valve  regurgitation.   6. Tricuspid valve regurgitation is moderate.   7. The aortic valve is grossly normal. Aortic valve regurgitation is not  visualized. No aortic stenosis is present.   8. Aortic dilatation noted. There is mild to moderate dilatation of the  ascending aorta measuring 43 mm.    9. The inferior vena cava is dilated in size with <50% respiratory  variability, suggesting right atrial pressure of 15 mmHg.   EKG:  EKG is personally reviewed.  The ekg ordered 05/09/20 demonstrates sinus tach at 115 bpm, LVH with nonspecific changes, biatrial enlargement  Recent Labs: 05/10/2020: B Natriuretic Peptide 1,827.0 05/12/2020: TSH 2.067 05/15/2020: ALT 24; Magnesium 2.1 06/11/2020: Hemoglobin 12.3; Platelets 330 08/28/2020: BUN 47; Creatinine, Ser 4.01; Potassium 4.8; Sodium 136  Recent Lipid Panel No results found for: CHOL, TRIG, HDL, CHOLHDL, VLDL, LDLCALC, LDLDIRECT  Physical Exam:    VS:  BP 134/88 (BP Location: Left Arm, Patient Position: Sitting)   Pulse 75   Ht 6' 1" (1.854 m)   Wt 210 lb 12.8 oz (95.6 kg)   SpO2 98%   BMI 27.81 kg/m     Wt Readings from Last 3 Encounters:  09/18/20 210 lb 12.8 oz (95.6 kg)  08/28/20 202 lb 6.4 oz (91.8 kg)  07/13/20 198 lb 12.8 oz (90.2 kg)    GEN: Well nourished, well developed in no acute distress HEENT: Normal, moist mucous membranes NECK: No JVD CARDIAC: regular rhythm, normal S1 and S2, no rubs or gallops. No murmur. VASCULAR: Radial and DP pulses 2+ bilaterally. No carotid bruits RESPIRATORY:  Clear to auscultation without rales, wheezing or rhonchi  ABDOMEN: Soft, non-tender, non-distended MUSCULOSKELETAL:  Ambulates independently SKIN: Warm and dry, no edema NEUROLOGIC:  Alert and oriented x 3. No focal neuro deficits noted. PSYCHIATRIC:  Normal affect    ASSESSMENT:    1. Primary hypertension   2. Cardiomyopathy due to hypertension, without heart failure ([place])   3. Hypertensive nephropathy   4. Cardiac risk counseling   5. Counseling on health promotion and disease prevention    PLAN:    Severe hypertension Hypertensive cardiomyopathy Hypertensive nephrology -has had secondary HTN workup: no evidence of renal artery stenosis. Elevated urinary and serum normetanephrine without overall elevated  metanephrines. Given that his symptoms are not sporadic in nature, pheochromocytoma seems less likely. Thyroid studies not significantly abnormal. He had normal aldosterone level with an elevated PRA and normal ratio. Has not had significant improvement on spironolactone -he is also following closely with nephrology -given his difficult to control blood pressures on multiple agents, we referred to advanced hypertension clinic -tolerating clonidine 0.1 mg BID, with significant improvement in his BP -limited options for his cardiomyopathy. On beta blocker, hydralazine, and nitrate. No ACEi/ARB/ARNI given renal function  Cardiac risk counseling and prevention recommendations: -recommend heart healthy/Mediterranean diet, with whole grains, fruits, vegetable, fish, lean meats, nuts, and olive oil. Limit salt. -recommend moderate walking, 3-5 times/week for 30-50 minutes each session. Aim for at least 150 minutes.week. Goal should be pace of 3 miles/hours, or walking 1.5 miles in 30 minutes -recommend avoidance of tobacco products. Avoid excess alcohol. -ASCVD risk score: The ASCVD Risk score Mikey Bussing DC Jr., et al., 2013) failed to calculate for the following reasons:   Cannot find a previous HDL lab   Cannot find a previous total cholesterol lab  Plan for follow up: several weeks with advanced hypertension clinic, then me in 3 mos  Buford Dresser, MD, PhD [place]  [facility]    Medication Adjustments/Labs and Tests Ordered: Current medicines are reviewed at length with the patient today.  Concerns regarding medicines are outlined above.  No orders of the defined types were placed in this encounter.  Meds ordered this encounter  Medications   DISCONTD: isosorbide mononitrate (IMDUR) 120 MG 24 hr tablet    Sig: Take 1 tablet (120 mg total) by mouth daily.    Dispense:  90 tablet    Refill:  3    Patient Instructions  Medication Instructions:  Your Physician recommend you  continue on your current medication as directed.    *If you need a refill on your cardiac medications before your next appointment, please call your pharmacy*   Lab Work: None   Testing/Procedures: None   Follow-Up: At [facility], you and your health needs are our priority.  As part of our continuing mission to provide you with exceptional heart care, we have created designated Provider Care Teams.  These Care Teams include your primary Cardiologist (physician) and Advanced Practice Providers (APPs -  Physician Assistants and Nurse Practitioners) who all work together to provide you with the care you need, when you need it.  We recommend signing up for the patient portal called "MyChart".  Sign up information is provided on this After Visit Summary.  MyChart is used to connect with patients for Virtual Visits (Telemedicine).  Patients are able to view lab/test results, encounter notes, upcoming appointments, etc.  Non-urgent messages can be sent to your provider as well.   To learn more about what you can do with MyChart, go to [email or web address].    Your next appointment:   3 month(s)  The format for your next appointment:   In Person  Provider:   Buford Dresser, MD   Other Instructions Try compression stockings (knee high). I like Dr. Katy Fitch, can get at department stores or online. Put them on first thing in the morning and take off at bedtime.   Signed, Buford Dresser, MD PhD 09/18/2020  [place]

## 2020-09-21 ENCOUNTER — Other Ambulatory Visit: Payer: Self-pay | Admitting: Cardiovascular Disease

## 2020-09-21 DIAGNOSIS — I10 Essential (primary) hypertension: Secondary | ICD-10-CM

## 2020-09-21 MED ORDER — CLONIDINE HCL 0.2 MG PO TABS: 0.2000 mg | ORAL_TABLET | Freq: Two times a day (BID) | ORAL | 5 refills | Status: DC

## 2020-09-21 NOTE — Telephone Encounter (Signed)
   *  STAT* If patient is at the pharmacy, call can be transferred to refill team.   1. Which medications need to be refilled? (please list name of each medication and dose if known)   cloNIDine (CATAPRES) 0.2 MG tablet    2. Which pharmacy/location (including street and city if local pharmacy) is medication to be sent to? [place]  3. Do they need a 30 day or 90 day supply? 90 days   Pt would like to get a cb once prescription is sent

## 2020-09-22 MED FILL — cloNIDine HCL 0.2 MG TABS: 0.2 | 30 days supply | Qty: 60 | Fill #0

## 2020-10-13 MED FILL — CARVEDILOL 25 MG TABLET: 25 | 30 days supply | Qty: 60 | Fill #4

## 2020-10-13 MED FILL — hydrALAZINE HCL 100 MG TABS: 100 | 30 days supply | Qty: 90 | Fill #4

## 2020-10-19 MED FILL — cloNIDine HCL 0.2 MG TABS: 0.2 | 30 days supply | Qty: 60 | Fill #1

## 2020-10-19 MED FILL — AMLODIPINE BESYLATE 10 MG T: 10 | 30 days supply | Qty: 30 | Fill #4

## 2020-11-09 MED FILL — hydrALAZINE HCL 100 MG TABS: 100 | 30 days supply | Qty: 90 | Fill #5

## 2020-11-12 MED FILL — CARVEDILOL 25 MG TABLET: 25 | 30 days supply | Qty: 60 | Fill #5

## 2020-11-18 MED FILL — cloNIDine HCL 0.2 MG TABS: 0.2 | 30 days supply | Qty: 60 | Fill #2

## 2020-11-23 MED FILL — AMLODIPINE BESYLATE 10 MG T: 10 | 30 days supply | Qty: 30 | Fill #5

## 2020-12-09 ENCOUNTER — Other Ambulatory Visit: Payer: Self-pay | Admitting: Cardiovascular Disease

## 2020-12-09 ENCOUNTER — Telehealth: Payer: Self-pay | Admitting: Cardiology

## 2020-12-09 MED ORDER — DOXAZOSIN MESYLATE 2 MG PO TABS: 2.0000 mg | ORAL_TABLET | Freq: Every day | ORAL | 5 refills | Status: DC

## 2020-12-09 MED FILL — hydrALAZINE HCL 100 MG TABS: 100 | 30 days supply | Qty: 90 | Fill #6

## 2020-12-09 MED FILL — CARVEDILOL 25 MG TABLET: 25 | 30 days supply | Qty: 60 | Fill #6

## 2020-12-09 MED FILL — DOXAZOSIN MESYLATE 2 MG TAB: 2 | 30 days supply | Qty: 30 | Fill #0

## 2020-12-09 NOTE — Telephone Encounter (Signed)
Have him start doxazosin 2 mg qhs.  He sees Dr. Harrell Gave in March, probably need to get him back in ADV HTN for April

## 2020-12-09 NOTE — Telephone Encounter (Signed)
Advised patient, verbalized understanding  Scheduled follow up

## 2020-12-09 NOTE — Telephone Encounter (Signed)
Spoke with patient and offered him visit with Pharm D today Confirmed patient taking medications as directed  Patient has COVID test scheduled at time available today His daughter has COVID so he is getting tested today, no s/s Will forward to Pharm D for review

## 2020-12-09 NOTE — Telephone Encounter (Signed)
Pt c/o BP issue: STAT if pt c/o blurred vision, one-sided weakness or slurred speech  1. What are your last 5 BP readings? 155/93, 136/100, 135/83, 144/88, 138/79  2. Are you having any other symptoms (ex. Dizziness, headache, blurred vision, passed out)? No  3. What is your BP issue?  BP is extremely high and is still climbing

## 2020-12-11 MED FILL — ISOSORBIDE MN 120 MG TAB SA: 120 | 30 days supply | Qty: 30 | Fill #0

## 2020-12-14 ENCOUNTER — Ambulatory Visit (INDEPENDENT_AMBULATORY_CARE_PROVIDER_SITE_OTHER): Payer: BLUE CROSS/BLUE SHIELD | Admitting: Primary Care

## 2020-12-14 ENCOUNTER — Encounter (INDEPENDENT_AMBULATORY_CARE_PROVIDER_SITE_OTHER): Payer: Self-pay | Admitting: Primary Care

## 2020-12-14 ENCOUNTER — Other Ambulatory Visit: Payer: Self-pay

## 2020-12-14 VITALS — BP 142/87 | HR 72 | Temp 97.9°F | Ht 73.0 in | Wt 218.8 lb

## 2020-12-14 DIAGNOSIS — I43 Cardiomyopathy in diseases classified elsewhere: Secondary | ICD-10-CM

## 2020-12-14 DIAGNOSIS — R351 Nocturia: Secondary | ICD-10-CM

## 2020-12-14 DIAGNOSIS — Z1211 Encounter for screening for malignant neoplasm of colon: Secondary | ICD-10-CM | POA: Diagnosis not present

## 2020-12-14 DIAGNOSIS — I119 Hypertensive heart disease without heart failure: Secondary | ICD-10-CM

## 2020-12-14 DIAGNOSIS — Z0000 Encounter for general adult medical examination without abnormal findings: Secondary | ICD-10-CM

## 2020-12-14 NOTE — Progress Notes (Signed)
Bobby Stevens is a 50 y.o. male presents to office today for annual physical exam examination.    Concerns today include: 1. None ( Does have to use bathroom several times a night)  Occupation: between jobs, Marital status: S, Substance use: No Diet: Low sodium , Exercise: walks 1 mile a day  Last eye exam: 2021 Last dental exam: none Last colonoscopy: refer today  Refills needed today: no Immunizations needed: Flu Vaccine: no  Tdap Vaccine: no  - every 64yr - (<3 lifetime doses or unknown): all wounds -- look up need for Tetanus IG - (>=3 lifetime doses): clean/minor wound if >13yrfrom previous; all other wounds if >5y7yrrom previous Zoster Vaccine: no (those >50yo, once) Pneumonia Vaccine: no (those w/ risk factors) - (<65y93yrth: Immunocompromised, cochlear implant, CSF leak, asplenic, sickle cell, Chronic Renal Failure - (<2yr24yrV-23 only: Heart dz, lung disease, DM, tobacco abuse, alcoholism, cirrhosis/liver disease. - (>2yr)61yrV13 then PPSV23 in 6-12mths;  - (>2yr):35yrat PPSV23 once if pt received prior to 50yo and 74yrs ha39yrassed  Past Medical History:  Diagnosis Date  . Hypertension 2020   Social History   Socioeconomic History  . Marital status: Single    Spouse name: Not on file  . Number of children: Not on file  . Years of education: Not on file  . Highest education level: Not on file  Occupational History  . Occupation: [employer] engineer[employer] Use  . Smoking status: Former Smoker  Research scientist (life sciences)eless tobacco: Never Used  Substance and Sexual Activity  . Alcohol use: Not Currently  . Drug use: Never  . Sexual activity: Not Currently  Other Topics Concern  . Not on file  Social History Narrative   Pt lives w/ mother   Social Determinants of Health   Financial Resource Strain: Low Risk   . Difficulty of Paying Living Expenses: Not very hard  Food Insecurity: No Food Insecurity  . Worried About Running Charity fundraiser Last Year: Never true  . Ran Out of Food in the Last Year: Never true  Transportation Needs: No Transportation Needs  . Lack of Transportation (Medical): No  . Lack of Transportation (Non-Medical): No  Physical Activity: Not on file  Stress: Not on file  Social Connections: Not on file  Intimate Partner Violence: Not on file   Past Surgical History:  Procedure Laterality Date  . NO PAST SURGERIES    . None     Family History  Problem Relation Age of Onset  . Hypertension Mother   . Hypertension Father        died age 90  . Hy2rtension Brother   . Hypertension Maternal Grandmother   . Hypertension Paternal Uncle   . Hypertension Paternal Grandmother     Current Outpatient Medications:  .  amLODipine (NORVASC) 10 MG tablet, Take 1 tablet (10 mg total) by mouth daily., Disp: 30 tablet, Rfl: 0 .  carvedilol (COREG) 25 MG tablet, Take 1 tablet (25 mg total) by mouth 2 (two) times daily with a meal., Disp: 60 tablet, Rfl: 0 .  cloNIDine (CATAPRES) 0.2 MG tablet, Take 1 tablet (0.2 mg total) by mouth 2 (two) times daily., Disp: 60 tablet, Rfl: 5 .  doxazosin (CARDURA) 2 MG tablet, Take 1 tablet (2 mg total) by mouth at bedtime., Disp: 30 tablet, Rfl: 5 .  hydrALAZINE (APRESOLINE) 100 MG tablet, Take 1 tablet (100 mg total) by mouth 3 (three)  times daily., Disp: 90 tablet, Rfl: 0 .  isosorbide mononitrate (IMDUR) 120 MG 24 hr tablet, Take 1 tablet (120 mg total) by mouth daily., Disp: 90 tablet, Rfl: 3 .  vitamin B-12 1000 MCG tablet, Take 1 tablet (1,000 mcg total) by mouth daily. (Patient taking differently: Take 500 mcg by mouth daily.), Disp: 30 tablet, Rfl: 0  Allergies  Allergen Reactions  . Penicillins Rash     ROS: Review of Systems Pertinent items noted in HPI and remainder of comprehensive ROS otherwise negative.    Physical exam Vitals:   12/14/20 1336  BP: (!) 142/87  Pulse: 72  Temp: 97.9 F (36.6 C)  TempSrc: Temporal  SpO2: 97%  Weight: 218 lb 12.8  oz (99.2 kg)  Height: '6\' 1"'$  (1.854 m)   General: Vital signs reviewed.  Patient is well-developed and well-nourished,male  in no acute distress and cooperative with exam.  Head: Normocephalic and atraumatic. Eyes: EOMI, conjunctivae normal, no scleral icterus.  Neck: Supple, trachea midline, normal ROM, no JVD, masses, thyromegaly, or carotid bruit present.  Cardiovascular: RRR, S1 normal, S2 normal, no murmurs, gallops, or rubs. Pulmonary/Chest: Clear to auscultation bilaterally, no wheezes, rales, or rhonchi. Abdominal: Soft, non-tender, non-distended, BS +, no masses, organomegaly, or guarding present.  Musculoskeletal: No joint deformities, erythema, or stiffness, ROM full and nontender. Extremities: No lower extremity edema bilaterally,  pulses symmetric and intact bilaterally. Neurological: A&O x3, Strength is normal and symmetric bilaterally, no focal motor deficit,   Skin: Warm, dry and intact. No rashes or erythema. Psychiatric: Normal mood and affect. speech and behavior is normal. Cognition and memory are normal.   Assessment/ Plan: Bobby Stevens here for annual physical exam.  Bobby Stevens was seen today for annual exam.  Diagnoses and all orders for this visit:  Colon cancer screening -     Ambulatory referral to Gastroenterology  Nocturia -     PSA  Annual physical exam Completed   Cardiomyopathy due to hypertension, without heart failure ([place]) Followed by cardiology    No problem-specific Assessment & Plan notes found for this encounter.   Counseled on healthy lifestyle choices, including diet (rich in fruits, vegetables and lean meats and low in salt and simple carbohydrates) and exercise (at least 30 minutes of moderate physical activity daily).  Patient to follow up in 1 year for annual exam or sooner if needed.  The above assessment and management plan was discussed with the patient. The patient verbalized understanding of and has agreed to the management plan.  Patient is aware to call the clinic if symptoms persist or worsen. Patient is aware when to return to the clinic for a follow-up visit. Patient educated on when it is appropriate to go to the emergency department.   Bobby Mire NP-C [address] [place] [phone]

## 2020-12-14 NOTE — Patient Instructions (Signed)
Prostate Cancer Screening  Prostate cancer screening is a test that is done to check for the presence of prostate cancer in men. The prostate gland is a walnut-sized gland that is located below the bladder and in front of the rectum in males. The function of the prostate is to add fluid to semen during ejaculation. Prostate cancer is the second most common type of cancer in men. Who should have prostate cancer screening?  Screening recommendations vary based on age and other risk factors. Screening is recommended if:  You are older than age 55. If you are age 55-69, talk with your health care provider about your need for screening and how often screening should be done. Because most prostate cancers are slow growing and will not cause death, screening is generally reserved in this age group for men who have a 10-15-year life expectancy.  You are younger than age 55, and you have these risk factors: ? Being a black male or a male of African descent. ? Having a father, brother, or uncle who has been diagnosed with prostate cancer. The risk is higher if your family member's cancer occurred at an early age. Screening is not recommended if:  You are younger than age 40.  You are between the ages of 40 and 54 and you have no risk factors.  You are 70 years of age or older. At this age, the risks that screening can cause are greater than the benefits that it may provide. If you are at high risk for prostate cancer, your health care provider may recommend that you have screenings more often or that you start screening at a younger age. How is screening for prostate cancer done? The recommended prostate cancer screening test is a blood test called the prostate-specific antigen (PSA) test. PSA is a protein that is made in the prostate. As you age, your prostate naturally produces more PSA. Abnormally high PSA levels may be caused by:  Prostate cancer.  An enlarged prostate that is not caused by cancer  (benign prostatic hyperplasia, BPH). This condition is very common in older men.  A prostate gland infection (prostatitis). Depending on the PSA results, you may need more tests, such as:  A physical exam to check the size of your prostate gland.  Blood and imaging tests.  A procedure to remove tissue samples from your prostate gland for testing (biopsy). What are the benefits of prostate cancer screening?  Screening can help to identify cancer at an early stage, before symptoms start and when the cancer can be treated more easily.  There is a small chance that screening may lower your risk of dying from prostate cancer. The chance is small because prostate cancer is a slow-growing cancer, and most men with prostate cancer die from a different cause. What are the risks of prostate cancer screening? The main risk of prostate cancer screening is diagnosing and treating prostate cancer that would never have caused any symptoms or problems. This is called overdiagnosisand overtreatment. PSA screening cannot tell you if your PSA is high due to cancer or a different cause. A prostate biopsy is the only procedure to diagnose prostate cancer. Even the results of a biopsy may not tell you if your cancer needs to be treated. Slow-growing prostate cancer may not need any treatment other than monitoring, so diagnosing and treating it may cause unnecessary stress or other side effects. A prostate biopsy may also cause:  Infection or fever.  A false negative. This is   a result that shows that you do not have prostate cancer when you actually do have prostate cancer. Questions to ask your health care provider  When should I start prostate cancer screening?  What is my risk for prostate cancer?  How often do I need screening?  What type of screening tests do I need?  How do I get my test results?  What do my results mean?  Do I need treatment? Where to find more information  The American Cancer  Society: www.cancer.org  American Urological Association: www.auanet.org Contact a health care provider if:  You have difficulty urinating.  You have pain when you urinate or ejaculate.  You have blood in your urine or semen.  You have pain in your back or in the area of your prostate. Summary  Prostate cancer is a common type of cancer in men. The prostate gland is located below the bladder and in front of the rectum. This gland adds fluid to semen during ejaculation.  Prostate cancer screening may identify cancer at an early stage, when the cancer can be treated more easily.  The prostate-specific antigen (PSA) test is the recommended screening test for prostate cancer.  Discuss the risks and benefits of prostate cancer screening with your health care provider. If you are age 50 or older, the risks that screening can cause are greater than the benefits that it may provide. This information is not intended to replace advice given to you by your health care provider. Make sure you discuss any questions you have with your health care provider. Document Revised: 01/24/2020 Document Reviewed: 05/16/2019 Elsevier Patient Education  [place].

## 2020-12-15 LAB — PSA: Prostate Specific Ag, Serum: 1.9 ng/mL (ref 0.0–4.0)

## 2020-12-18 ENCOUNTER — Telehealth (INDEPENDENT_AMBULATORY_CARE_PROVIDER_SITE_OTHER): Payer: Self-pay

## 2020-12-18 MED FILL — AMLODIPINE BESYLATE 10 MG T: 10 | 30 days supply | Qty: 30 | Fill #6

## 2020-12-18 MED FILL — cloNIDine HCL 0.2 MG TABS: 0.2 | 30 days supply | Qty: 60 | Fill #3

## 2020-12-18 NOTE — Telephone Encounter (Signed)
-----   Message from Kerin Perna, NP sent at 12/18/2020  8:37 AM EST ----- PSA normal.

## 2020-12-18 NOTE — Telephone Encounter (Signed)
Per DPR left voicemail notifying patient that PSA is normal. Return call to [facility] at [phone] with any questions or concerns. Nat Christen, CMA

## 2020-12-22 ENCOUNTER — Other Ambulatory Visit: Payer: Self-pay | Admitting: Nephrology

## 2020-12-23 MED FILL — CHLORTHALIDONE 25 MG TAB: 25 | 90 days supply | Qty: 45 | Fill #0

## 2020-12-23 MED FILL — ISOSORBIDE MN ER 30 MG TAB: 30 | 90 days supply | Qty: 90 | Fill #0

## 2020-12-29 ENCOUNTER — Encounter: Payer: Self-pay | Admitting: Cardiology

## 2020-12-29 ENCOUNTER — Other Ambulatory Visit: Payer: Self-pay

## 2020-12-29 ENCOUNTER — Ambulatory Visit (INDEPENDENT_AMBULATORY_CARE_PROVIDER_SITE_OTHER): Payer: BLUE CROSS/BLUE SHIELD | Admitting: Cardiology

## 2020-12-29 VITALS — BP 130/78 | HR 73 | Ht 73.0 in | Wt 219.4 lb

## 2020-12-29 DIAGNOSIS — I10 Essential (primary) hypertension: Secondary | ICD-10-CM | POA: Diagnosis not present

## 2020-12-29 DIAGNOSIS — I129 Hypertensive chronic kidney disease with stage 1 through stage 4 chronic kidney disease, or unspecified chronic kidney disease: Secondary | ICD-10-CM

## 2020-12-29 DIAGNOSIS — Z7189 Other specified counseling: Secondary | ICD-10-CM | POA: Diagnosis not present

## 2020-12-29 DIAGNOSIS — I428 Other cardiomyopathies: Secondary | ICD-10-CM

## 2020-12-29 DIAGNOSIS — N184 Chronic kidney disease, stage 4 (severe): Secondary | ICD-10-CM

## 2020-12-29 NOTE — Patient Instructions (Signed)
Medication Instructions:  Your Physician recommend you continue on your current medication as directed.    *If you need a refill on your cardiac medications before your next appointment, please call your pharmacy*   Lab Work: None  Testing/Procedures: Your physician has requested that you have an echocardiogram in 3 months. Echocardiography is a painless test that uses sound waves to create images of your heart. It provides your doctor with information about the size and shape of your heart and how well your heart's chambers and valves are working. This procedure takes approximately one hour. There are no restrictions for this procedure. [address]    Follow-Up: At [facility], you and your health needs are our priority.  As part of our continuing mission to provide you with exceptional heart care, we have created designated Provider Care Teams.  These Care Teams include your primary Cardiologist (physician) and Advanced Practice Providers (APPs -  Physician Assistants and Nurse Practitioners) who all work together to provide you with the care you need, when you need it.  We recommend signing up for the patient portal called "MyChart".  Sign up information is provided on this After Visit Summary.  MyChart is used to connect with patients for Virtual Visits (Telemedicine).  Patients are able to view lab/test results, encounter notes, upcoming appointments, etc.  Non-urgent messages can be sent to your provider as well.   To learn more about what you can do with MyChart, go to [email or web address].    Your next appointment:   3 month(s)  The format for your next appointment:   In Person  Provider:   Bridgette Christopher, MD     

## 2020-12-29 NOTE — Progress Notes (Signed)
Cardiology Office Note:    Date:  12/29/2020   ID:  Bobby Stevens, DOB 07-05-1971, MRN [id number]  PCP:  Kerin Perna, NP  Cardiologist:  Buford Dresser, MD  Nephrologist: Dr. Candiss Norse  Referring MD: Kerin Perna, NP   CC: follow up  History of Present Illness:    Bobby Stevens is a 50 y.o. male with a hx of hypertensive urgency, cardiomyopathy.  I met him in the hospital 04/2020.   Today: Blood pressure is the best it has ever been. No swelling, good control since starting chlorthalidone 12.5 mg daily from nephrology (updated med list today). Has worked hard to clear out his stress level. Very committed to his health, focused on wellness. Lost his job but this has helped his stress as well.   Checking BP routinely at home, has been 937-169 systolic, most around 678.  Discussed repeating cardiac ultrasound, will aim for 3 mos of optimum medical therapy and BP control, aim for 03/2021.   Denies chest pain, shortness of breath at rest or with normal exertion. No PND, orthopnea, LE edema or unexpected weight gain. No syncope or palpitations.  Past Medical History:  Diagnosis Date   Hypertension 2020    Past Surgical History:  Procedure Laterality Date   NO PAST SURGERIES     None      Current Medications: Current Outpatient Medications on File Prior to Visit  Medication Sig   amLODipine (NORVASC) 10 MG tablet Take 1 tablet (10 mg total) by mouth daily.   carvedilol (COREG) 25 MG tablet Take 1 tablet (25 mg total) by mouth 2 (two) times daily with a meal.   chlorthalidone (HYGROTON) 25 MG tablet Take 12.5 mg by mouth daily.   cloNIDine (CATAPRES) 0.2 MG tablet Take 1 tablet (0.2 mg total) by mouth 2 (two) times daily.   doxazosin (CARDURA) 2 MG tablet Take 1 tablet (2 mg total) by mouth at bedtime.   hydrALAZINE (APRESOLINE) 100 MG tablet Take 1 tablet (100 mg total) by mouth 3 (three) times daily.   isosorbide mononitrate (IMDUR) 120 MG 24  hr tablet Take 1 tablet (120 mg total) by mouth daily.   vitamin B-12 1000 MCG tablet Take 1 tablet (1,000 mcg total) by mouth daily. (Patient taking differently: Take 500 mcg by mouth daily.)   No current facility-administered medications on file prior to visit.     Allergies:   Penicillins   Social History   Tobacco Use   Smoking status: Former Smoker   Smokeless tobacco: Never Used  Substance Use Topics   Alcohol use: Not Currently   Drug use: Never    Family History: family history includes Hypertension in his brother, father, maternal grandmother, mother, paternal grandmother, and paternal uncle.  ROS:   Please see the history of present illness.  Additional pertinent ROS otherwise unremarkable.    EKGs/Labs/Other Studies Reviewed:    The following studies were reviewed today: Echo 05/10/20 1. Left ventricular ejection fraction, by estimation, is 25 to 30%. The  left ventricle has severely decreased function. The left ventricle  demonstrates global hypokinesis. There is moderate concentric left  ventricular hypertrophy. Left ventricular  diastolic parameters are consistent with Grade III diastolic dysfunction  (restrictive). Elevated left ventricular end-diastolic pressure.  2. Right ventricular systolic function is mildly reduced. The right  ventricular size is normal. There is moderately elevated pulmonary artery  systolic pressure.  3. Left atrial size was moderately dilated.  4. Right atrial size was mildly dilated.  5.  The mitral valve is normal in structure. Mild to moderate mitral valve  regurgitation.  6. Tricuspid valve regurgitation is moderate.  7. The aortic valve is grossly normal. Aortic valve regurgitation is not  visualized. No aortic stenosis is present.  8. Aortic dilatation noted. There is mild to moderate dilatation of the  ascending aorta measuring 43 mm.  9. The inferior vena cava is dilated in size with <50% respiratory   variability, suggesting right atrial pressure of 15 mmHg.   EKG:  EKG is personally reviewed.  The ekg ordered 05/09/20 demonstrates sinus tach at 115 bpm, LVH with nonspecific changes, biatrial enlargement  Recent Labs: 05/10/2020: B Natriuretic Peptide 1,827.0 05/12/2020: TSH 2.067 05/15/2020: ALT 24; Magnesium 2.1 06/11/2020: Hemoglobin 12.3; Platelets 330 08/28/2020: BUN 47; Creatinine, Ser 4.01; Potassium 4.8; Sodium 136  Recent Lipid Panel No results found for: CHOL, TRIG, HDL, CHOLHDL, VLDL, LDLCALC, LDLDIRECT  Physical Exam:    VS:  BP 130/78    Pulse 73    Ht 6' 1"  (1.854 m)    Wt 219 lb 6.4 oz (99.5 kg)    SpO2 98%    BMI 28.95 kg/m     Wt Readings from Last 3 Encounters:  12/29/20 219 lb 6.4 oz (99.5 kg)  12/14/20 218 lb 12.8 oz (99.2 kg)  09/18/20 210 lb 12.8 oz (95.6 kg)    GEN: Well nourished, well developed in no acute distress HEENT: Normal, moist mucous membranes NECK: No JVD CARDIAC: regular rhythm, normal S1 and S2, no rubs or gallops. No murmur. VASCULAR: Radial and DP pulses 2+ bilaterally. No carotid bruits RESPIRATORY:  Clear to auscultation without rales, wheezing or rhonchi  ABDOMEN: Soft, non-tender, non-distended MUSCULOSKELETAL:  Ambulates independently SKIN: Warm and dry, no edema NEUROLOGIC:  Alert and oriented x 3. No focal neuro deficits noted. PSYCHIATRIC:  Normal affect   ASSESSMENT:    1. Nonischemic cardiomyopathy ([place])   2. Primary hypertension   3. Hypertensive nephropathy   4. Cardiac risk counseling   5. Counseling on health promotion and disease prevention   6. CKD (chronic kidney disease) stage 4, GFR 15-29 ml/min (HCC)    PLAN:    Severe uncontrolled hypertension Hypertensive cardiomyopathy Hypertensive nephrology -his blood pressure today is the best I have ever seen it. He feels great, is very positive and motivated. Has made a number of major life changes, which he feels has helped with his stress level. -has had secondary  HTN workup, largely unremarkable. Elevated metanephrines but symptoms not consistent with pheochromocytmoa -limited options for his cardiomyopathy. On beta blocker, hydralazine, and nitrate. No ACEi/ARB/ARNI given renal function -continue current meds as listed, including amlodipine, carvedilol, clonidine, doxazosin, hydralazine, imdur, and chlorthalidone -we will recheck echo in 3 mos as he is well controlled today. Hopefully EF will have improved with treatment -has been following at [facility] for renal [facility], declined based on recent note from Dr. Candiss Norse, but I am hopeful for his options long term. He has been extremely motivated and adherent in my experience with him  Cardiac risk counseling and prevention recommendations: -recommend heart healthy/Mediterranean diet, with whole grains, fruits, vegetable, fish, lean meats, nuts, and olive oil. Limit salt. -recommend moderate walking, 3-5 times/week for 30-50 minutes each session. Aim for at least 150 minutes.week. Goal should be pace of 3 miles/hours, or walking 1.5 miles in 30 minutes -recommend avoidance of tobacco products. Avoid excess alcohol. -ASCVD risk score: The ASCVD Risk score Mikey Bussing DC Jr., et al., 2013) failed to calculate for the  following reasons:   Cannot find a previous HDL lab   Cannot find a previous total cholesterol lab    Plan for follow up: repeat echo in about 3 mos, then follow up with me  Buford Dresser, MD, PhD [place]   [facility]    Medication Adjustments/Labs and Tests Ordered: Current medicines are reviewed at length with the patient today.  Concerns regarding medicines are outlined above.  Orders Placed This Encounter  Procedures   ECHOCARDIOGRAM COMPLETE   No orders of the defined types were placed in this encounter.   Patient Instructions  Medication Instructions:  Your Physician recommend you continue on your current medication as directed.    *If you need a refill on your cardiac  medications before your next appointment, please call your pharmacy*   Lab Work: None  Testing/Procedures: Your physician has requested that you have an echocardiogram in 3 months. Echocardiography is a painless test that uses sound waves to create images of your heart. It provides your doctor with information about the size and shape of your heart and how well your hearts chambers and valves are working. This procedure takes approximately one hour. There are no restrictions for this procedure. [address] [address]    Follow-Up: At [facility], you and your health needs are our priority.  As part of our continuing mission to provide you with exceptional heart care, we have created designated Provider Care Teams.  These Care Teams include your primary Cardiologist (physician) and Advanced Practice Providers (APPs -  Physician Assistants and Nurse Practitioners) who all work together to provide you with the care you need, when you need it.  We recommend signing up for the patient portal called "MyChart".  Sign up information is provided on this After Visit Summary.  MyChart is used to connect with patients for Virtual Visits (Telemedicine).  Patients are able to view lab/test results, encounter notes, upcoming appointments, etc.  Non-urgent messages can be sent to your provider as well.   To learn more about what you can do with MyChart, go to [email or web address].    Your next appointment:   3 month(s)  The format for your next appointment:   In Person  Provider:   Buford Dresser, MD       Signed, Buford Dresser, MD PhD 12/29/2020  [place]

## 2020-12-31 ENCOUNTER — Encounter: Payer: Self-pay | Admitting: Cardiology

## 2020-12-31 DIAGNOSIS — N184 Chronic kidney disease, stage 4 (severe): Secondary | ICD-10-CM | POA: Insufficient documentation

## 2020-12-31 DIAGNOSIS — I428 Other cardiomyopathies: Secondary | ICD-10-CM | POA: Insufficient documentation

## 2021-01-04 MED FILL — DOXAZOSIN MESYLATE 2 MG TAB: 2 | 30 days supply | Qty: 30 | Fill #1

## 2021-01-11 MED FILL — hydrALAZINE HCL 100 MG TABS: 100 | 30 days supply | Qty: 90 | Fill #7

## 2021-01-11 MED FILL — CARVEDILOL 25 MG TABLET: 25 | 30 days supply | Qty: 60 | Fill #7

## 2021-01-15 MED FILL — cloNIDine HCL 0.2 MG TABS: 0.2 | 30 days supply | Qty: 60 | Fill #4

## 2021-01-16 ENCOUNTER — Other Ambulatory Visit: Payer: Self-pay

## 2021-01-20 ENCOUNTER — Other Ambulatory Visit: Payer: Self-pay

## 2021-01-20 MED FILL — Amlodipine Besylate Tab 10 MG (Base Equivalent): ORAL | 90 days supply | Qty: 90 | Fill #0 | Status: AC

## 2021-01-21 ENCOUNTER — Other Ambulatory Visit: Payer: Self-pay

## 2021-01-22 ENCOUNTER — Other Ambulatory Visit: Payer: Self-pay

## 2021-02-09 ENCOUNTER — Other Ambulatory Visit: Payer: Self-pay

## 2021-02-09 MED FILL — Hydralazine HCl Tab 100 MG: ORAL | 30 days supply | Qty: 90 | Fill #0 | Status: AC

## 2021-02-09 MED FILL — Clonidine HCl Tab 0.2 MG: ORAL | 30 days supply | Qty: 60 | Fill #0 | Status: CN

## 2021-02-10 ENCOUNTER — Encounter: Payer: Self-pay | Admitting: Cardiovascular Disease

## 2021-02-10 ENCOUNTER — Other Ambulatory Visit: Payer: Self-pay

## 2021-02-10 ENCOUNTER — Ambulatory Visit (INDEPENDENT_AMBULATORY_CARE_PROVIDER_SITE_OTHER): Payer: BLUE CROSS/BLUE SHIELD | Admitting: Cardiovascular Disease

## 2021-02-10 VITALS — BP 144/106 | HR 85 | Ht 72.0 in | Wt 221.0 lb

## 2021-02-10 DIAGNOSIS — I10 Essential (primary) hypertension: Secondary | ICD-10-CM | POA: Diagnosis not present

## 2021-02-10 DIAGNOSIS — I428 Other cardiomyopathies: Secondary | ICD-10-CM | POA: Diagnosis not present

## 2021-02-10 DIAGNOSIS — I1A0 Resistant hypertension: Secondary | ICD-10-CM

## 2021-02-10 HISTORY — DX: Resistant hypertension: I1A.0

## 2021-02-10 HISTORY — DX: Essential (primary) hypertension: I10

## 2021-02-10 MED ORDER — DOXAZOSIN MESYLATE 4 MG PO TABS
4.0000 mg | ORAL_TABLET | Freq: Every day | ORAL | 3 refills | Status: DC
  Filled 2021-02-10: qty 90, 90d supply, fill #0
  Filled 2021-05-11: qty 90, 90d supply, fill #1

## 2021-02-10 NOTE — Assessment & Plan Note (Signed)
He is euvolemic and doing well.  No ARB 2/2 CKD IV.  Continue carvedilol.

## 2021-02-10 NOTE — Progress Notes (Signed)
Advanced Hypertension Clinic Follow-up:    Date:  02/10/2021   ID:  Bobby Stevens, DOB 1971/05/30, MRN [id number]  PCP:  Kerin Perna, NP  Cardiologist:  Buford Dresser, MD  Nephrologist:  Referring MD: Kerin Perna, NP   CC: Hypertension  History of Present Illness:    Bobby Stevens is a 50 y.o. male with a hx of chronic systolic and diastolic heart failure, nonadherence, CKD IV, and hypertension here to establish care in the hypertension clinic.  He reports that he was first diagnosed with hypertension in around 2006.  It was initially well-controlled on lisinopril and hydrochlorothiazide.  However he had difficulty affording his medication and was off of it for at least the last 6 months.  Bobby Stevens was hospitalized 04/2020 with hypertensive emergency and acute renal failure.Marland Kitchen  His blood pressure on presentation was 246/181.  Echo that admission revealed LVEF 25 to 30% with global hypokinesis.  Lisinopril and HCTZ were discontinued.  Both serum and plasma metanephrines were notable for elevated normetanephrine.  He denies tachycardia or diaphoresis.  Labs were also concerning for hyperaldosteronism. Bobby Stevens followed up with Dr. Harrell Gave on 9/3 and his BP was.  Clonidine was added to his regimen.  He initially felt very sleepy but this seems to be improving.  At his initial assessment, he struggled with salt intake. There was concern for hyperaldosteronism but we were unable to get a CT or MRI due to his renal function. Quantity was increased to 0.2 mg twice daily. He was referred to the PREP program and asked to stop using NSAIDs. He saw nephrology and was started on chlorthalidone 12.5 mg and our PharmD added doxazosin on 12/06/20.    He reports that his left eye became inflamed last month he has went to the urgent care clinic multiple times and Optho. He notes that  it has resolved and then returned soon after. He continues to return to [facility] for  treatment, he was given eye drops. His swelling resolved but he still has some eye redness. He also had photophobia. He is taking Tylenol as needed to manage his eye pain, but he is concerned if this related to his elevated blood pressure. He is not participating in formal exercise, but he is walking some and active at church. He denies having any exertional shortness of breath, chest pain, tightness, or pressure. He is watching his salt intake and is trying to eat healthier. He denies having any bilateral LE edema, palpitations, lightheadedness, or dizziness.  Previous antihypertensives: Lisinopril HCTZ   Past Medical History:  Diagnosis Date  . Hypertension 2020  . Resistant hypertension 02/10/2021    Past Surgical History:  Procedure Laterality Date  . NO PAST SURGERIES    . None      Current Medications: Current Meds  Medication Sig  . amLODipine (NORVASC) 10 MG tablet TAKE 1 TABLET BY MOUTH ONCE A DAY  . carvedilol (COREG) 25 MG tablet TAKE 1 TABLET BY MOUTH TWO TIMES DAILY  . chlorthalidone (HYGROTON) 25 MG tablet TAKE 0.5 TABLET BY MOUTH ONCE A DAY  . cloNIDine (CATAPRES) 0.2 MG tablet TAKE 1 TABLET (0.2 MG TOTAL) BY MOUTH 2 (TWO) TIMES DAILY.  Marland Kitchen Difluprednate 0.05 % EMUL Place 1 drop into the left eye every 4 hours.  . hydrALAZINE (APRESOLINE) 100 MG tablet TAKE 1 TABLET BY MOUTH THREE TIMES DAILY  . isosorbide mononitrate (IMDUR) 30 MG 24 hr tablet TAKE 1 TABLET BY MOUTH ONCE A DAY  . vitamin  B-12 (CYANOCOBALAMIN) 250 MCG tablet Take 250 mcg by mouth daily.  . [DISCONTINUED] doxazosin (CARDURA) 2 MG tablet TAKE 1 TABLET (2 MG TOTAL) BY MOUTH AT BEDTIME.     Allergies:   Penicillins   Social History   Socioeconomic History  . Marital status: Single    Spouse name: Not on file  . Number of children: Not on file  . Years of education: Not on file  . Highest education level: Not on file  Occupational History  . Occupation: Automotive engineer: [employer]   Tobacco Use  . Smoking status: Former Research scientist (life sciences)  . Smokeless tobacco: Never Used  Substance and Sexual Activity  . Alcohol use: Not Currently  . Drug use: Never  . Sexual activity: Not Currently  Other Topics Concern  . Not on file  Social History Narrative   Pt lives w/ mother   Social Determinants of Health   Financial Resource Strain: Low Risk   . Difficulty of Paying Living Expenses: Not very hard  Food Insecurity: No Food Insecurity  . Worried About Charity fundraiser in the Last Year: Never true  . Ran Out of Food in the Last Year: Never true  Transportation Needs: No Transportation Needs  . Lack of Transportation (Medical): No  . Lack of Transportation (Non-Medical): No  Physical Activity: Not on file  Stress: Not on file  Social Connections: Not on file     Family History: The patient's family history includes Hypertension in his brother, father, maternal grandmother, mother, paternal grandmother, and paternal uncle.  ROS:   Please see the history of present illness.    All other systems reviewed and are negative.  EKGs/Labs/Other Studies Reviewed:    EKG:   02/10/21-EKG is not ordered today.   7/24/2-sinus tachycardia. Rate 115 bpm. LVH with repolarization abnormalities.  Biatrial enlargement.    Recent Labs: 05/10/2020: B Natriuretic Peptide 1,827.0 05/12/2020: TSH 2.067 05/15/2020: ALT 24; Magnesium 2.1 06/11/2020: Hemoglobin 12.3; Platelets 330 08/28/2020: BUN 47; Creatinine, Ser 4.01; Potassium 4.8; Sodium 136   Recent Lipid Panel No results found for: CHOL, TRIG, HDL, CHOLHDL, VLDL, LDLCALC, LDLDIRECT   Echo 05/10/20: 1. Left ventricular ejection fraction, by estimation, is 25 to 30%. The  left ventricle has severely decreased function. The left ventricle  demonstrates global hypokinesis. There is moderate concentric left  ventricular hypertrophy. Left ventricular  diastolic parameters are consistent with Grade III diastolic dysfunction  (restrictive).  Elevated left ventricular end-diastolic pressure.  2. Right ventricular systolic function is mildly reduced. The right  ventricular size is normal. There is moderately elevated pulmonary artery  systolic pressure.  3. Left atrial size was moderately dilated.  4. Right atrial size was mildly dilated.  5. The mitral valve is normal in structure. Mild to moderate mitral valve  regurgitation.  6. Tricuspid valve regurgitation is moderate.  7. The aortic valve is grossly normal. Aortic valve regurgitation is not  visualized. No aortic stenosis is present.  8. Aortic dilatation noted. There is mild to moderate dilatation of the  ascending aorta measuring 43 mm.  9. The inferior vena cava is dilated in size with <50% respiratory  variability, suggesting right atrial pressure of 15 mmHg.   Physical Exam:    VS:  BP (!) 144/106   Pulse 85   Ht 6' (1.829 m)   Wt 221 lb (100.2 kg)   SpO2 98%   BMI 29.97 kg/m  , BMI Body mass index is 29.97 kg/m.  GENERAL:  Well appearing HEENT: Pupils equal round and reactive, fundi not visualized, oral mucosa unremarkable NECK:  No jugular venous distention, waveform within normal limits, carotid upstroke brisk and symmetric, no bruits LUNGS:  Clear to auscultation bilaterally HEART:  RRR.  PMI not displaced or sustained,S1 and S2 within normal limits, no S3, no S4, no clicks, no rubs, no murmurs ABD:  Flat, positive bowel sounds normal in frequency in pitch, no bruits, no rebound, no guarding, no midline pulsatile mass, no hepatomegaly, no splenomegaly EXT:  2 plus pulses throughout, no edema, no cyanosis no clubbing SKIN:  No rashes no nodules NEURO:  Cranial nerves II through XII grossly intact, motor grossly intact throughout PSYCH:  Cognitively intact, oriented to person place and time   ASSESSMENT:    1. Nonischemic cardiomyopathy ([place])   2. Resistant hypertension     PLAN:   Nonischemic cardiomyopathy ([place]) He is euvolemic and  doing well.  No ARB 2/2 CKD IV.  Continue carvedilol.   Resistant hypertension Blood pressure remains poorly controlled.  It was much better before he developed uveitis.  He says significant eye discomfort which I think is contributing.  He also has not been as active and has gained weight.  He is going to work on trying to start back exercising.  For now, will increase doxazosin to 4 mg daily.  Continue chlorthalidone, amlodipine, hydralazine, carvedilol, and clonidine.  There is some concern for hyperaldosteronism but we are limited in the ability to further test due to his renal dysfunction.  He also has not a good candidate for spironolactone due to his renal dysfunction.  Continue with medical management for now.  He is no longer using NSAIDs.  Renal Dopplers were normal 04/2020, as was his thyroid.   Secondary Causes of Hypertension  Medications/Herbal: OCP, steroids, stimulants, antidepressants, weight loss medication, immune suppressants, NSAIDs, sympathomimetics, alcohol, caffeine, licorice, ginseng, St. John's wort, chemo Sleep Apnea: minimal symptoms.   Renal artery stenosis: Renal artery Dopplers - 04/2020 Hyperaldosteronism: Positive 04/2020 Hyper/hypothyroidism: TSH normal 04/2020 Pheochromocytoma: Positive normetanephrines plasma and urine 04/2020 Cushing's syndrome: (testing not indicated)  Coarctation of the aorta (testing not indicated)   Disposition:    FU with MD/PharmD in 3 months   Medication Adjustments/Labs and Tests Ordered: Current medicines are reviewed at length with the patient today.  Concerns regarding medicines are outlined above.  No orders of the defined types were placed in this encounter.  Meds ordered this encounter  Medications  . doxazosin (CARDURA) 4 MG tablet    Sig: Take 1 tablet (4 mg total) by mouth at bedtime.    Dispense:  90 tablet    Refill:  3    D/C 2 MG RX    I,Alexis Bryant,acting as a scribe for Skeet Latch, MD.,have documented  all relevant documentation on the behalf of Skeet Latch, MD,as directed by  Skeet Latch, MD while in the presence of Skeet Latch, MD.    Signed, Skeet Latch, MD  02/10/2021 4:58 PM    [place]

## 2021-02-10 NOTE — Assessment & Plan Note (Signed)
Blood pressure remains poorly controlled.  It was much better before he developed uveitis.  He says significant eye discomfort which I think is contributing.  He also has not been as active and has gained weight.  He is going to work on trying to start back exercising.  For now, will increase doxazosin to 4 mg daily.  Continue chlorthalidone, amlodipine, hydralazine, carvedilol, and clonidine.  There is some concern for hyperaldosteronism but we are limited in the ability to further test due to his renal dysfunction.  He also has not a good candidate for spironolactone due to his renal dysfunction.  Continue with medical management for now.  He is no longer using NSAIDs.  Renal Dopplers were normal 04/2020, as was his thyroid.

## 2021-02-10 NOTE — Patient Instructions (Addendum)
Medication Instructions:  INCREASE YOUR DOXAZOSIN TO 4 MG   Labwork: NONE  Testing/Procedures: NONE  Follow-Up: 05/12/2021 AT 9:15 AM WITH DR [facility] [place]

## 2021-02-12 ENCOUNTER — Other Ambulatory Visit: Payer: Self-pay

## 2021-02-15 ENCOUNTER — Other Ambulatory Visit: Payer: Self-pay

## 2021-02-15 MED FILL — Carvedilol Tab 25 MG: ORAL | 90 days supply | Qty: 180 | Fill #0 | Status: AC

## 2021-02-16 ENCOUNTER — Other Ambulatory Visit: Payer: Self-pay

## 2021-02-16 MED FILL — Clonidine HCl Tab 0.2 MG: ORAL | 30 days supply | Qty: 60 | Fill #0 | Status: AC

## 2021-02-17 ENCOUNTER — Other Ambulatory Visit: Payer: Self-pay

## 2021-02-18 ENCOUNTER — Other Ambulatory Visit: Payer: Self-pay

## 2021-03-01 ENCOUNTER — Telehealth: Payer: Self-pay | Admitting: Cardiovascular Disease

## 2021-03-01 DIAGNOSIS — I10 Essential (primary) hypertension: Secondary | ICD-10-CM

## 2021-03-01 NOTE — Telephone Encounter (Signed)
Returned call to patient who states that his blood pressure has been running a little bit lower than normal. Patient states that his blood pressure has been around 123456 systolic but states that this morning his blood pressure was 107/53. Patient reports feeling slightly lightheaded this morning, but states that he has not experienced any since then and is not currently having any symptoms at this time.   Patient checked BP while on the phone and it is currently 100/57- patient denies any symptoms at this time. Patient states that he tested positive for Covid right after mothers day.   Patient states that his hydration status is good.   Patient would like to know if Dr. Oval Linsey has any recommendations.   Made patient aware of ED precautions should new or worsening symptoms develop. Patient verbalized understanding.

## 2021-03-01 NOTE — Telephone Encounter (Signed)
Pt c/o BP issue: STAT if pt c/o blurred vision, one-sided weakness or slurred speech  1. What are your last 5 BP readings? 107/53 124/74 131/71 121/64  2. Are you having any other symptoms (ex. Dizziness, headache, blurred vision, passed out)? Feeling off a little because the decrease in the BP  3. What is your BP issue? PT is calling with concerns of his fluctuating BP.He states that athis issue just started and would like to know how to get it under contril.

## 2021-03-09 MED ORDER — CLONIDINE HCL 0.1 MG PO TABS
0.1000 mg | ORAL_TABLET | Freq: Two times a day (BID) | ORAL | 5 refills | Status: DC
  Filled 2021-03-09: qty 60, 30d supply, fill #0
  Filled 2021-04-13: qty 60, 30d supply, fill #1
  Filled 2021-05-11: qty 60, 30d supply, fill #2
  Filled 2021-06-12: qty 60, 30d supply, fill #3
  Filled 2021-07-15: qty 60, 30d supply, fill #4
  Filled 2021-08-16: qty 60, 30d supply, fill #5

## 2021-03-09 NOTE — Addendum Note (Signed)
Addended by: Alvina Filbert B on: 03/09/2021 06:59 PM   Modules accepted: Orders

## 2021-03-09 NOTE — Telephone Encounter (Signed)
Reduce clonidine to 0.'1mg'$  bid.

## 2021-03-09 NOTE — Telephone Encounter (Signed)
Advised patient, verbalized understanding  

## 2021-03-10 ENCOUNTER — Other Ambulatory Visit: Payer: Self-pay

## 2021-03-11 MED FILL — Hydralazine HCl Tab 100 MG: ORAL | 30 days supply | Qty: 90 | Fill #1 | Status: AC

## 2021-03-12 ENCOUNTER — Other Ambulatory Visit: Payer: Self-pay

## 2021-03-16 ENCOUNTER — Other Ambulatory Visit: Payer: Self-pay

## 2021-03-31 ENCOUNTER — Ambulatory Visit (HOSPITAL_COMMUNITY): Payer: BLUE CROSS/BLUE SHIELD | Attending: Internal Medicine

## 2021-03-31 ENCOUNTER — Other Ambulatory Visit: Payer: Self-pay

## 2021-03-31 DIAGNOSIS — I428 Other cardiomyopathies: Secondary | ICD-10-CM | POA: Insufficient documentation

## 2021-03-31 LAB — ECHOCARDIOGRAM COMPLETE
Area-P 1/2: 3.91 cm2
S' Lateral: 3.2 cm

## 2021-04-07 ENCOUNTER — Other Ambulatory Visit: Payer: Self-pay

## 2021-04-07 MED FILL — Hydralazine HCl Tab 100 MG: ORAL | 30 days supply | Qty: 90 | Fill #2 | Status: AC

## 2021-04-07 MED FILL — Isosorbide Mononitrate Tab ER 24HR 30 MG: ORAL | 90 days supply | Qty: 90 | Fill #0 | Status: AC

## 2021-04-07 MED FILL — Amlodipine Besylate Tab 10 MG (Base Equivalent): ORAL | 60 days supply | Qty: 60 | Fill #1 | Status: AC

## 2021-04-08 ENCOUNTER — Other Ambulatory Visit: Payer: Self-pay

## 2021-04-09 ENCOUNTER — Other Ambulatory Visit: Payer: Self-pay

## 2021-04-13 ENCOUNTER — Other Ambulatory Visit: Payer: Self-pay

## 2021-04-16 ENCOUNTER — Other Ambulatory Visit: Payer: Self-pay

## 2021-05-11 ENCOUNTER — Other Ambulatory Visit: Payer: Self-pay

## 2021-05-11 MED FILL — Chlorthalidone Tab 25 MG: ORAL | 30 days supply | Qty: 15 | Fill #0 | Status: AC

## 2021-05-11 MED FILL — Carvedilol Tab 25 MG: ORAL | 30 days supply | Qty: 60 | Fill #1 | Status: AC

## 2021-05-11 MED FILL — Hydralazine HCl Tab 100 MG: ORAL | 30 days supply | Qty: 90 | Fill #3 | Status: AC

## 2021-05-11 NOTE — Progress Notes (Signed)
Today.  Advanced Hypertension Clinic Follow-up:    Date:  05/12/2021   ID:  Jacobson Milan, DOB 28-Feb-1971, MRN [id number]  PCP:  Kerin Perna, NP  Cardiologist:  Buford Dresser, MD  Nephrologist: Dr. [name]  Referring MD: Kerin Perna, NP   CC: Hypertension  History of Present Illness:    Bobby Stevens is a 50 y.o. male with a hx of chronic systolic and diastolic heart failure (LVEF has normalized), nonadherence, CKD IV, and hypertension here for follow-up. He initially established care in the hypertension clinic 07/13/2020. He reports that he was first diagnosed with hypertension in around 2006.  It was initially well-controlled on lisinopril and hydrochlorothiazide.  However he had difficulty affording his medication and was off of it for at least the last 6 months.  Mr. Krempasky was hospitalized 04/2020 with hypertensive emergency and acute renal failure.Marland Kitchen  His blood pressure on presentation was 246/181.  Echo that admission revealed LVEF 25 to 30% with global hypokinesis.  Lisinopril and HCTZ were discontinued.  Both serum and plasma metanephrines were notable for elevated normetanephrine.  He denied tachycardia or diaphoresis.  Labs were also concerning for hyperaldosteronism. Mr. Cuna followed up with Dr. Harrell Gave on 9/3 and his BP was elevated. Clonidine was added to his regimen.    At his initial assessment, he struggled with salt intake. There was concern for hyperaldosteronism but we were unable to get a CT or MRI due to his renal function. Clonidine was increased to 0.2 mg twice daily. He was referred to the PREP program and asked to stop using NSAIDs. He saw nephrology and was started on chlorthalidone 12.5 mg and our PharmD added doxazosin on 12/06/20. It was increased at his last visit.  He called our office 02/2021 with low blood pressures, so clonidine was reduced.  Today, he reports having to rush to the office this morning. He is surprised at his  high blood pressure in clinic today. At home, his blood pressure has been stable, with readings such as [id number] or [id number] systolic. He takes his medicine around 7-8 AM. He has noticed that his blood pressure is usually more elevated in the afternoon. Of note, he has been under a lot of stress, in part due to needing to travel often to be with his family. For exercise he has been walking every day. Occasionally he works out with his mother. Yesterday he did yard work, and was "just a little" winded. He is also sometimes winded while walking. He is doing well with taking his medication and watching his diet. Normally he has LE edema every once in a while, and it is usually worse in his left LE. He will notice the edema by nighttime, but it improves by morning. He denies any chest pain, or palpitations. No headaches, lightheadedness, or syncope to report. Also has no orthopnea or PND.   Previous antihypertensives: Lisinopril HCTZ   Past Medical History:  Diagnosis Date   Hypertension 2020   Resistant hypertension 02/10/2021    Past Surgical History:  Procedure Laterality Date   NO PAST SURGERIES     None      Current Medications: Current Meds  Medication Sig   amLODipine (NORVASC) 10 MG tablet TAKE 1 TABLET BY MOUTH ONCE A DAY   carvedilol (COREG) 25 MG tablet TAKE 1 TABLET BY MOUTH TWO TIMES DAILY   chlorthalidone (HYGROTON) 25 MG tablet TAKE 0.5 TABLET BY MOUTH ONCE A DAY   cloNIDine (CATAPRES) 0.1 MG tablet Take 1  tablet (0.1 mg total) by mouth 2 (two) times daily.   Difluprednate 0.05 % EMUL Place 1 drop into the left eye every 4 hours.   hydrALAZINE (APRESOLINE) 100 MG tablet TAKE 1 TABLET BY MOUTH THREE TIMES DAILY   isosorbide mononitrate (IMDUR) 30 MG 24 hr tablet TAKE 1 TABLET BY MOUTH ONCE A DAY   vitamin B-12 (CYANOCOBALAMIN) 250 MCG tablet Take 250 mcg by mouth daily.   [DISCONTINUED] doxazosin (CARDURA) 4 MG tablet Take 1 tablet (4 mg total) by mouth at bedtime.     Allergies:    Penicillins   Social History   Socioeconomic History   Marital status: Single    Spouse name: Not on file   Number of children: Not on file   Years of education: Not on file   Highest education level: Not on file  Occupational History   Occupation: Futures trader    Employer: [employer]  Tobacco Use   Smoking status: Former   Smokeless tobacco: Never  Substance and Sexual Activity   Alcohol use: Not Currently   Drug use: Never   Sexual activity: Not Currently  Other Topics Concern   Not on file  Social History Narrative   Pt lives w/ mother   Social Determinants of Health   Financial Resource Strain: Low Risk    Difficulty of Paying Living Expenses: Not very hard  Food Insecurity: No Food Insecurity   Worried About Charity fundraiser in the Last Year: Never true   [place] in the Last Year: Never true  Transportation Needs: No Transportation Needs   Lack of Transportation (Medical): No   Lack of Transportation (Non-Medical): No  Physical Activity: Not on file  Stress: Not on file  Social Connections: Not on file     Family History: The patient's family history includes Hypertension in his brother, father, maternal grandmother, mother, paternal grandmother, and paternal uncle.  ROS:   Please see the history of present illness. (+) Exertional shortness of breath (+) Stress   (+) LE edema (L>R) All other systems reviewed and are negative.  EKGs/Labs/Other Studies Reviewed:    EKG:   05/12/2021: Sinus rhythm. Rate 78 bpm. LVH. 02/10/21-EKG was not ordered.   7/24/2-sinus tachycardia. Rate 115 bpm. LVH with repolarization abnormalities.  Biatrial enlargement.    Recent Labs: 05/15/2020: ALT 24; Magnesium 2.1 06/11/2020: Hemoglobin 12.3; Platelets 330 08/28/2020: BUN 47; Creatinine, Ser 4.01; Potassium 4.8; Sodium 136   Recent Lipid Panel No results found for: CHOL, TRIG, HDL, CHOLHDL, VLDL, LDLCALC, LDLDIRECT   Echo 03/31/2021: 1. Left ventricular  ejection fraction, by estimation, is 55 to 60%. Left  ventricular ejection fraction by 3D volume is 58 %. The left ventricle has  normal function. The left ventricle has no regional wall motion  abnormalities. There is mild left  ventricular hypertrophy. Left ventricular diastolic parameters are  consistent with Grade I diastolic dysfunction (impaired relaxation).   2. Right ventricular systolic function is normal. The right ventricular  size is normal.   3. The mitral valve is grossly normal. Trivial mitral valve  regurgitation.   4. The aortic valve is tricuspid. Aortic valve regurgitation is not  visualized.   5. Aortic dilatation noted. There is mild dilatation of the aortic root,  measuring 41 mm. There is mild dilatation of the ascending aorta,  measuring 41 mm.   6. The inferior vena cava is normal in size with greater than 50%  respiratory variability, suggesting right atrial pressure of  3 mmHg.   Comparison(s): 05/10/20 EF 25-30%.   Echo 05/10/20:  1. Left ventricular ejection fraction, by estimation, is 25 to 30%. The  left ventricle has severely decreased function. The left ventricle  demonstrates global hypokinesis. There is moderate concentric left  ventricular hypertrophy. Left ventricular  diastolic parameters are consistent with Grade III diastolic dysfunction  (restrictive). Elevated left ventricular end-diastolic pressure.   2. Right ventricular systolic function is mildly reduced. The right  ventricular size is normal. There is moderately elevated pulmonary artery  systolic pressure.   3. Left atrial size was moderately dilated.   4. Right atrial size was mildly dilated.   5. The mitral valve is normal in structure. Mild to moderate mitral valve  regurgitation.   6. Tricuspid valve regurgitation is moderate.   7. The aortic valve is grossly normal. Aortic valve regurgitation is not  visualized. No aortic stenosis is present.   8. Aortic dilatation noted. There is  mild to moderate dilatation of the  ascending aorta measuring 43 mm.   9. The inferior vena cava is dilated in size with <50% respiratory  variability, suggesting right atrial pressure of 15 mmHg.   Physical Exam:    VS:  BP (!) 146/84 (BP Location: Left Arm, Patient Position: Sitting)   Pulse 78   Ht '6\' 1"'$  (1.854 m)   Wt 220 lb 6.4 oz (100 kg)   BMI 29.08 kg/m  , BMI Body mass index is 29.08 kg/m.  GENERAL:  Well appearing HEENT: Pupils equal round and reactive, fundi not visualized, oral mucosa unremarkable NECK:  No jugular venous distention, waveform within normal limits, carotid upstroke brisk and symmetric, no bruits LUNGS:  Clear to auscultation bilaterally HEART:  RRR.  PMI not displaced or sustained,S1 and S2 within normal limits, no S3, no S4, no clicks, no rubs, II/VI systolic murmur at the RUSB ABD:  Flat, positive bowel sounds normal in frequency in pitch, no bruits, no rebound, no guarding, no midline pulsatile mass, no hepatomegaly, no splenomegaly EXT:  2 plus pulses throughout, no edema, no cyanosis no clubbing SKIN:  No rashes no nodules NEURO:  Cranial nerves II through XII grossly intact, motor grossly intact throughout PSYCH:  Cognitively intact, oriented to person place and time   ASSESSMENT:    1. Resistant hypertension   2. Cardiomyopathy due to hypertension, without heart failure ([place])   3. Nonischemic cardiomyopathy ([place])   4. CKD (chronic kidney disease) stage 4, GFR 15-29 ml/min (HCC)      PLAN:   Hypertensive cardiomyopathy ([place]) Blood pressure remains above goal but is much better.  He notes that it is better at home than it has been here, but for the most part it is still above goal.  He should be less than 130/80.  We will continue amlodipine, carvedilol, chlorthalidone, clonidine, hydralazine, and Imdur.  We will increase the doxazosin to 8 mg twice a day.  He has follow-up scheduled with his nephrologist in about a month.  He will bring his home  blood pressure machine to that appointment and have them double check its accuracy.  We will follow-up with him in 3 months.  Nonischemic cardiomyopathy (HCC) LVEF has normalized on his most recent echo.  He is euvolemic on exam.  Continue blood pressure management as above.  CKD (chronic kidney disease) stage 4, GFR 15-29 ml/min (HCC) The only relevant medication he is on is chlorthalidone.  He sees his nephrologist next month and will have labs checked at that  time.  Continue antihypertensive regimen as above.   Secondary Causes of Hypertension  Medications/Herbal: OCP, steroids, stimulants, antidepressants, weight loss medication, immune suppressants, NSAIDs, sympathomimetics, alcohol, caffeine, licorice, ginseng, St. John's wort, chemo Sleep Apnea: minimal symptoms.   Renal artery stenosis: Renal artery Dopplers - 04/2020 Hyperaldosteronism: Positive 04/2020 Hyper/hypothyroidism: TSH normal 04/2020 Pheochromocytoma: Positive normetanephrines plasma and urine 04/2020 Cushing's syndrome: (testing not indicated)  Coarctation of the aorta (testing not indicated)   Disposition:    FU with MD/PharmD in 3 months.   Medication Adjustments/Labs and Tests Ordered: Current medicines are reviewed at length with the patient today.  Concerns regarding medicines are outlined above.  Orders Placed This Encounter  Procedures   EKG 12-Lead    Meds ordered this encounter  Medications   doxazosin (CARDURA) 4 MG tablet    Sig: Take 1 tablet (4 mg total) by mouth 2 (two) times daily.    Dispense:  180 tablet    Refill:  3    D/C 2 MG RX     I,Mathew Stumpf,acting as a scribe for Skeet Latch, MD.,have documented all relevant documentation on the behalf of Skeet Latch, MD,as directed by  Skeet Latch, MD while in the presence of Skeet Latch, MD.  I, [place], MD have reviewed all documentation for this visit.  The documentation of the exam, diagnosis, procedures, and  orders on 05/12/2021 are all accurate and complete.   Signed, Skeet Latch, MD  05/12/2021 10:06 AM    [place]

## 2021-05-12 ENCOUNTER — Ambulatory Visit (HOSPITAL_BASED_OUTPATIENT_CLINIC_OR_DEPARTMENT_OTHER): Payer: BLUE CROSS/BLUE SHIELD | Admitting: Cardiovascular Disease

## 2021-05-12 ENCOUNTER — Encounter (HOSPITAL_BASED_OUTPATIENT_CLINIC_OR_DEPARTMENT_OTHER): Payer: Self-pay | Admitting: Cardiovascular Disease

## 2021-05-12 ENCOUNTER — Other Ambulatory Visit: Payer: Self-pay

## 2021-05-12 VITALS — BP 146/84 | HR 78 | Ht 73.0 in | Wt 220.4 lb

## 2021-05-12 DIAGNOSIS — I428 Other cardiomyopathies: Secondary | ICD-10-CM

## 2021-05-12 DIAGNOSIS — N184 Chronic kidney disease, stage 4 (severe): Secondary | ICD-10-CM

## 2021-05-12 DIAGNOSIS — I10 Essential (primary) hypertension: Secondary | ICD-10-CM

## 2021-05-12 DIAGNOSIS — I119 Hypertensive heart disease without heart failure: Secondary | ICD-10-CM | POA: Diagnosis not present

## 2021-05-12 DIAGNOSIS — I43 Cardiomyopathy in diseases classified elsewhere: Secondary | ICD-10-CM

## 2021-05-12 MED ORDER — DOXAZOSIN MESYLATE 4 MG PO TABS
4.0000 mg | ORAL_TABLET | Freq: Two times a day (BID) | ORAL | 3 refills | Status: DC
  Filled 2021-05-12: qty 180, 90d supply, fill #0
  Filled 2021-05-12: qty 60, 30d supply, fill #0
  Filled 2021-06-12: qty 60, 30d supply, fill #1
  Filled 2021-07-22: qty 60, 30d supply, fill #2
  Filled 2021-08-23: qty 60, 30d supply, fill #3
  Filled 2021-09-22: qty 60, 30d supply, fill #4
  Filled 2021-10-23: qty 60, 30d supply, fill #5
  Filled 2021-10-25: qty 60, 30d supply, fill #0
  Filled 2021-11-22: qty 60, 30d supply, fill #1
  Filled 2021-12-25: qty 60, 30d supply, fill #2
  Filled 2022-01-23: qty 60, 30d supply, fill #3
  Filled 2022-02-26: qty 180, 90d supply, fill #4

## 2021-05-12 NOTE — Assessment & Plan Note (Signed)
LVEF has normalized on his most recent echo.  He is euvolemic on exam.  Continue blood pressure management as above.

## 2021-05-12 NOTE — Patient Instructions (Addendum)
Medication Instructions:  INCREASE- Doxazosin 4 mg by mouth twice a day  *If you need a refill on your cardiac medications before your next appointment, please call your pharmacy*  Lab Work: None Ordered  Testing/Procedures: None Ordered  Follow-Up: At [facility], you and your health needs are our priority.  As part of our continuing mission to provide you with exceptional heart care, we have created designated Provider Care Teams.  These Care Teams include your primary Cardiologist (physician) and Advanced Practice Providers (APPs -  Physician Assistants and Nurse Practitioners) who all work together to provide you with the care you need, when you need it.  We recommend signing up for the patient portal called "MyChart".  Sign up information is provided on this After Visit Summary.  MyChart is used to connect with patients for Virtual Visits (Telemedicine).  Patients are able to view lab/test results, encounter notes, upcoming appointments, etc.  Non-urgent messages can be sent to your provider as well.   To learn more about what you can do with MyChart, go to [email or web address].    Your next appointment:   3 month(s)  The format for your next appointment:   In Person  Provider:   Skeet Latch, MD 08/16/2021 AT 1:30 PM

## 2021-05-12 NOTE — Assessment & Plan Note (Signed)
The only relevant medication he is on is chlorthalidone.  He sees his nephrologist next month and will have labs checked at that time.  Continue antihypertensive regimen as above.

## 2021-05-12 NOTE — Assessment & Plan Note (Signed)
Blood pressure remains above goal but is much better.  He notes that it is better at home than it has been here, but for the most part it is still above goal.  He should be less than 130/80.  We will continue amlodipine, carvedilol, chlorthalidone, clonidine, hydralazine, and Imdur.  We will increase the doxazosin to 8 mg twice a day.  He has follow-up scheduled with his nephrologist in about a month.  He will bring his home blood pressure machine to that appointment and have them double check its accuracy.  We will follow-up with him in 3 months.

## 2021-06-04 ENCOUNTER — Other Ambulatory Visit: Payer: Self-pay

## 2021-06-04 MED FILL — Chlorthalidone Tab 25 MG: ORAL | 30 days supply | Qty: 15 | Fill #1 | Status: AC

## 2021-06-12 ENCOUNTER — Other Ambulatory Visit (INDEPENDENT_AMBULATORY_CARE_PROVIDER_SITE_OTHER): Payer: Self-pay

## 2021-06-14 ENCOUNTER — Other Ambulatory Visit: Payer: Self-pay

## 2021-06-14 MED ORDER — HYDRALAZINE HCL 100 MG PO TABS
100.0000 mg | ORAL_TABLET | Freq: Three times a day (TID) | ORAL | 3 refills | Status: DC
  Filled 2021-06-14: qty 270, 90d supply, fill #0
  Filled 2021-09-22: qty 90, 30d supply, fill #1
  Filled 2021-10-23: qty 90, 30d supply, fill #2
  Filled 2021-10-25: qty 90, 30d supply, fill #0
  Filled 2021-11-27: qty 90, 30d supply, fill #1
  Filled 2022-01-06: qty 90, 30d supply, fill #2
  Filled 2022-02-09: qty 90, 30d supply, fill #3
  Filled 2022-03-15: qty 90, 30d supply, fill #4
  Filled 2022-04-27: qty 90, 30d supply, fill #5

## 2021-06-14 MED ORDER — AMLODIPINE BESYLATE 10 MG PO TABS
10.0000 mg | ORAL_TABLET | Freq: Every day | ORAL | 3 refills | Status: DC
  Filled 2021-06-14: qty 90, 90d supply, fill #0
  Filled 2021-09-22: qty 30, 30d supply, fill #1
  Filled 2021-11-01: qty 30, 30d supply, fill #2
  Filled 2021-11-01: qty 30, 30d supply, fill #0
  Filled 2021-11-27: qty 30, 30d supply, fill #1
  Filled 2021-12-31: qty 30, 30d supply, fill #2
  Filled 2022-02-03: qty 30, 30d supply, fill #3
  Filled 2022-02-26: qty 90, 90d supply, fill #4

## 2021-06-14 MED ORDER — CARVEDILOL 25 MG PO TABS
25.0000 mg | ORAL_TABLET | Freq: Two times a day (BID) | ORAL | 3 refills | Status: DC
Start: 2021-06-14 — End: 2022-06-22
  Filled 2021-06-14: qty 180, 90d supply, fill #0
  Filled 2021-09-22: qty 60, 30d supply, fill #1
  Filled 2021-10-23: qty 60, 30d supply, fill #2
  Filled 2021-10-25: qty 60, 30d supply, fill #0
  Filled 2021-12-13: qty 60, 30d supply, fill #1
  Filled 2022-01-16: qty 60, 30d supply, fill #2
  Filled 2022-02-26: qty 180, 90d supply, fill #3

## 2021-06-15 ENCOUNTER — Other Ambulatory Visit: Payer: Self-pay

## 2021-06-18 ENCOUNTER — Other Ambulatory Visit: Payer: Self-pay

## 2021-07-15 ENCOUNTER — Other Ambulatory Visit: Payer: Self-pay

## 2021-07-16 ENCOUNTER — Other Ambulatory Visit: Payer: Self-pay

## 2021-07-22 ENCOUNTER — Other Ambulatory Visit: Payer: Self-pay

## 2021-07-22 MED FILL — Isosorbide Mononitrate Tab ER 24HR 30 MG: ORAL | 90 days supply | Qty: 90 | Fill #1 | Status: AC

## 2021-08-16 ENCOUNTER — Encounter (HOSPITAL_BASED_OUTPATIENT_CLINIC_OR_DEPARTMENT_OTHER): Payer: Self-pay | Admitting: Cardiovascular Disease

## 2021-08-16 ENCOUNTER — Ambulatory Visit (HOSPITAL_BASED_OUTPATIENT_CLINIC_OR_DEPARTMENT_OTHER): Payer: BLUE CROSS/BLUE SHIELD | Admitting: Cardiovascular Disease

## 2021-08-16 ENCOUNTER — Other Ambulatory Visit: Payer: Self-pay

## 2021-08-16 DIAGNOSIS — I43 Cardiomyopathy in diseases classified elsewhere: Secondary | ICD-10-CM | POA: Diagnosis not present

## 2021-08-16 DIAGNOSIS — N184 Chronic kidney disease, stage 4 (severe): Secondary | ICD-10-CM

## 2021-08-16 DIAGNOSIS — I10 Essential (primary) hypertension: Secondary | ICD-10-CM

## 2021-08-16 DIAGNOSIS — I119 Hypertensive heart disease without heart failure: Secondary | ICD-10-CM | POA: Diagnosis not present

## 2021-08-16 NOTE — Patient Instructions (Addendum)
Medication Instructions:  INCREASE YOUR CHLORTHALIDONE TO FULL TABLET DAILY FOR 2-3 DAYS ONLY   Labwork: NONE  Testing/Procedures: NONE  Follow-Up: AS NEEDED   Your physician recommends that you schedule a follow-up appointment in: DR CHRISTOPHER 3 MONTHS   Any Other Special Instructions Will Be Listed Below (If Applicable).  For constipation you can try milk of magnesia, Miralax, or senna/S  For allergies try Flonase nasal spray and Zyrtec

## 2021-08-16 NOTE — Assessment & Plan Note (Signed)
LVEF has normalized.  Increase chlorthalidone to 25 mg for 2 days to help with fluid removal.

## 2021-08-16 NOTE — Progress Notes (Signed)
Advanced Hypertension Clinic Follow-up:    Date:  08/16/2021   ID:  Bobby Stevens, DOB [birth year], MRN [id number]  PCP:  Kerin Perna, NP  Cardiologist:  Buford Dresser, MD  Nephrologist: Dr. [name]  Referring MD: Kerin Perna, NP   CC: Hypertension  History of Present Illness:    Bobby Stevens is a 50 y.o. male with a hx of chronic systolic and diastolic heart failure (LVEF has normalized), nonadherence, CKD IV, and hypertension here for follow-up. He initially established care in the hypertension clinic 07/13/2020. He reports that he was first diagnosed with hypertension in around 2006.  It was initially well-controlled on lisinopril and hydrochlorothiazide.  However he had difficulty affording his medication and was off of it for at least the last 6 months.  Mr. Gebhard was hospitalized 04/2020 with hypertensive emergency and acute renal failure.Marland Kitchen  His blood pressure on presentation was 246/181.  Echo that admission revealed LVEF 25 to 30% with global hypokinesis.  Lisinopril and HCTZ were discontinued.  Both serum and plasma metanephrines were notable for elevated normetanephrine.  He denied tachycardia or diaphoresis.  Labs were also concerning for hyperaldosteronism. Mr. Pulido followed up with Dr. Harrell Gave on 9/3 and his BP was elevated. Clonidine was added to his regimen.    At his initial assessment, he struggled with salt intake. There was concern for hyperaldosteronism but we were unable to get a CT or MRI due to his renal function. Clonidine was increased to 0.2 mg twice daily. He was referred to the PREP program and asked to stop using NSAIDs. He saw nephrology and was started on chlorthalidone 12.5 mg and our PharmD added doxazosin on 12/06/20. It was increased at his last visit.  He called our office 02/2021 with low blood pressures, so clonidine was reduced.    At his last appointment, he reported minor dyspnea on exertion but was otherwise doing  well. Doxazosin was increased at his last visit. Today, he is feeling pretty good. However, for the past week he has been suffering from persistent abdominal bloating with some associated lightheadedness. Also, he has congestion and dry mouth often. He endorses seasonal allergies. Sometimes he notices LE edema as well. At home his blood pressure has been averaging 130-140/70s. For exercise he is walking every day. He does monitor his salt intake, and  states his diet is "okay but needs to be better". He will continue to work on improving his diet. He denies any palpitations, chest pain, or shortness of breath. No headaches, syncope, orthopnea, PND, or exertional symptoms.  Previous antihypertensives: Lisinopril HCTZ   Past Medical History:  Diagnosis Date   Hypertension 2020   Resistant hypertension 02/10/2021    Past Surgical History:  Procedure Laterality Date   NO PAST SURGERIES     None      Current Medications: Current Meds  Medication Sig   amLODipine (NORVASC) 10 MG tablet Take 1 tablet (10 mg total) by mouth daily.   carvedilol (COREG) 25 MG tablet Take 1 tablet (25 mg total) by mouth 2 (two) times daily.   chlorthalidone (HYGROTON) 25 MG tablet TAKE 0.5 TABLET BY MOUTH ONCE A DAY   cloNIDine (CATAPRES) 0.1 MG tablet Take 1 tablet (0.1 mg total) by mouth 2 (two) times daily.   Difluprednate 0.05 % EMUL Place 1 drop into the left eye every 4 hours.   doxazosin (CARDURA) 4 MG tablet Take 1 tablet (4 mg total) by mouth 2 (two) times daily.   hydrALAZINE (APRESOLINE)  100 MG tablet Take 1 tablet (100 mg total) by mouth 3 (three) times daily.   isosorbide mononitrate (IMDUR) 30 MG 24 hr tablet TAKE 1 TABLET BY MOUTH ONCE A DAY   vitamin B-12 (CYANOCOBALAMIN) 250 MCG tablet Take 250 mcg by mouth daily.   [DISCONTINUED] amLODipine (NORVASC) 10 MG tablet TAKE 1 TABLET BY MOUTH ONCE A DAY   [DISCONTINUED] carvedilol (COREG) 25 MG tablet TAKE 1 TABLET BY MOUTH TWO TIMES DAILY    [DISCONTINUED] hydrALAZINE (APRESOLINE) 100 MG tablet TAKE 1 TABLET BY MOUTH THREE TIMES DAILY     Allergies:   Penicillins   Social History   Socioeconomic History   Marital status: Single    Spouse name: Not on file   Number of children: Not on file   Years of education: Not on file   Highest education level: Not on file  Occupational History   Occupation: Automotive engineer: [employer]  Tobacco Use   Smoking status: Former   Smokeless tobacco: Never  Substance and Sexual Activity   Alcohol use: Not Currently   Drug use: Never   Sexual activity: Not Currently  Other Topics Concern   Not on file  Social History Narrative   Pt lives w/ mother   Social Determinants of Health   Financial Resource Strain: Low Risk    Difficulty of Paying Living Expenses: Not very hard  Food Insecurity: No Food Insecurity   Worried About Charity fundraiser in the Last Year: Never true   Ran Out of Food in the Last Year: Never true  Transportation Needs: No Transportation Needs   Lack of Transportation (Medical): No   Lack of Transportation (Non-Medical): No  Physical Activity: Not on file  Stress: Not on file  Social Connections: Not on file     Family History: The patient's family history includes Hypertension in his brother, father, maternal grandmother, mother, paternal grandmother, and paternal uncle.  ROS:   Please see the history of present illness. (+) Abdominal bloating  (+) Lightheadedness (+) Congestion (+) Dry mouth (+) Bilateral LE edema (+) Seasonal allergies All other systems reviewed and are negative.  EKGs/Labs/Other Studies Reviewed:    Echo 03/31/2021: 1. Left ventricular ejection fraction, by estimation, is 55 to 60%. Left  ventricular ejection fraction by 3D volume is 58 %. The left ventricle has  normal function. The left ventricle has no regional wall motion  abnormalities. There is mild left  ventricular hypertrophy. Left ventricular diastolic  parameters are  consistent with Grade I diastolic dysfunction (impaired relaxation).   2. Right ventricular systolic function is normal. The right ventricular  size is normal.   3. The mitral valve is grossly normal. Trivial mitral valve  regurgitation.   4. The aortic valve is tricuspid. Aortic valve regurgitation is not  visualized.   5. Aortic dilatation noted. There is mild dilatation of the aortic root,  measuring 41 mm. There is mild dilatation of the ascending aorta,  measuring 41 mm.   6. The inferior vena cava is normal in size with greater than 50%  respiratory variability, suggesting right atrial pressure of 3 mmHg.   Comparison(s): 05/10/20 EF 25-30%.   Echo 05/10/20:  1. Left ventricular ejection fraction, by estimation, is 25 to 30%. The  left ventricle has severely decreased function. The left ventricle  demonstrates global hypokinesis. There is moderate concentric left  ventricular hypertrophy. Left ventricular  diastolic parameters are consistent with Grade III diastolic dysfunction  (restrictive). Elevated  left ventricular end-diastolic pressure.   2. Right ventricular systolic function is mildly reduced. The right  ventricular size is normal. There is moderately elevated pulmonary artery  systolic pressure.   3. Left atrial size was moderately dilated.   4. Right atrial size was mildly dilated.   5. The mitral valve is normal in structure. Mild to moderate mitral valve  regurgitation.   6. Tricuspid valve regurgitation is moderate.   7. The aortic valve is grossly normal. Aortic valve regurgitation is not  visualized. No aortic stenosis is present.   8. Aortic dilatation noted. There is mild to moderate dilatation of the  ascending aorta measuring 43 mm.   9. The inferior vena cava is dilated in size with <50% respiratory  variability, suggesting right atrial pressure of 15 mmHg.   EKG:   08/16/2021: EKG was not ordered today. 05/12/2021: Sinus rhythm. Rate 78  bpm. LVH. 02/10/21-EKG was not ordered.   7/24/2-sinus tachycardia. Rate 115 bpm. LVH with repolarization abnormalities.  Biatrial enlargement.    Recent Labs: 08/28/2020: BUN 47; Creatinine, Ser 4.01; Potassium 4.8; Sodium 136   Recent Lipid Panel No results found for: CHOL, TRIG, HDL, CHOLHDL, VLDL, LDLCALC, LDLDIRECT   Physical Exam:    VS:  BP 138/72 (BP Location: Left Arm, Patient Position: Sitting, Cuff Size: Large)   Pulse 85   Ht 6\' 1"  (1.854 m)   Wt 224 lb 6.4 oz (101.8 kg)   SpO2 99%   BMI 29.61 kg/m  , BMI Body mass index is 29.61 kg/m.  GENERAL:  Well appearing HEENT: Pupils equal round and reactive, fundi not visualized, oral mucosa unremarkable NECK:  No jugular venous distention, waveform within normal limits, carotid upstroke brisk and symmetric, no bruits LUNGS:  Clear to auscultation bilaterally HEART:  RRR.  PMI not displaced or sustained,S1 and S2 within normal limits, no S3, no S4, no clicks, no rubs, II/VI systolic murmur at the RUSB ABD:  Flat, positive bowel sounds normal in frequency in pitch, no bruits, no rebound, no guarding, no midline pulsatile mass, no hepatomegaly, no splenomegaly EXT:  2 plus pulses throughout, trace bilateral LE edema, no cyanosis no clubbing SKIN:  No rashes no nodules NEURO:  Cranial nerves II through XII grossly intact, motor grossly intact throughout PSYCH:  Cognitively intact, oriented to person place and time   1. Resistant hypertension   2. Cardiomyopathy due to hypertension, without heart failure ([place])   3. CKD (chronic kidney disease) stage 4, GFR 15-29 ml/min (HCC)       ASSESSMENT/PLAN:    Resistant hypertension Blood pressure was initially elevated but better on repeat.  At home his blood pressure has been mostly in the 130s.  He has been exercising regularly.  He does admit that he could work on his diet some.  He has some mild volume overload but overall has been stable.  We will continue carvedilol,  amlodipine, chlorthalidone, clonidine, hydralazine, doxazosin, and Imdur.  He does have some mild volume overload and will increase his chlorthalidone to 25 mg for a couple days prior to going back on 12.5 mg daily.  Work-up for secondary causes was notable for elevated urine and serum metanephrines.  However given his renal dysfunction we are not able to do any abdominal imaging with contrast for adenomas.  He does not have symptoms of pheochromocytoma.  Hypertensive cardiomyopathy (HCC) LVEF has normalized.  Increase chlorthalidone to 25 mg for 2 days to help with fluid removal.  CKD (chronic kidney disease) stage  4, GFR 15-29 ml/min (HCC) He follows up with Dr. Candiss Norse in nephrology.   Secondary Causes of Hypertension  Medications/Herbal: OCP, steroids, stimulants, antidepressants, weight loss medication, immune suppressants, NSAIDs, sympathomimetics, alcohol, caffeine, licorice, ginseng, St. John's wort, chemo Sleep Apnea: minimal symptoms.   Renal artery stenosis: Renal artery Dopplers - 04/2020 Hyperaldosteronism: Positive 04/2020 Hyper/hypothyroidism: TSH normal 04/2020 Pheochromocytoma: Positive normetanephrines plasma and urine 04/2020 Cushing's syndrome: (testing not indicated)  Coarctation of the aorta (testing not indicated)   Disposition:    FU with Darlisha Kelm C. Oval Linsey, MD, [facility]  PRN.   Medication Adjustments/Labs and Tests Ordered: Current medicines are reviewed at length with the patient today.  Concerns regarding medicines are outlined above.   No orders of the defined types were placed in this encounter.   No orders of the defined types were placed in this encounter.   I,Mathew Stumpf,acting as a Education administrator for Skeet Latch, MD.,have documented all relevant documentation on the behalf of Skeet Latch, MD,as directed by  Skeet Latch, MD while in the presence of Skeet Latch, MD.  I, [place] Oval Linsey, MD have reviewed all documentation for this visit.  The  documentation of the exam, diagnosis, procedures, and orders on 08/16/2021 are all accurate and complete.   Signed, Skeet Latch, MD  08/16/2021 3:02 PM    [facility]

## 2021-08-16 NOTE — Assessment & Plan Note (Signed)
Blood pressure was initially elevated but better on repeat.  At home his blood pressure has been mostly in the 130s.  He has been exercising regularly.  He does admit that he could work on his diet some.  He has some mild volume overload but overall has been stable.  We will continue carvedilol, amlodipine, chlorthalidone, clonidine, hydralazine, doxazosin, and Imdur.  He does have some mild volume overload and will increase his chlorthalidone to 25 mg for a couple days prior to going back on 12.5 mg daily.  Work-up for secondary causes was notable for elevated urine and serum metanephrines.  However given his renal dysfunction we are not able to do any abdominal imaging with contrast for adenomas.  He does not have symptoms of pheochromocytoma.

## 2021-08-16 NOTE — Assessment & Plan Note (Signed)
He follows up with Dr. Candiss Norse in nephrology.

## 2021-08-23 ENCOUNTER — Other Ambulatory Visit: Payer: Self-pay

## 2021-08-24 ENCOUNTER — Other Ambulatory Visit: Payer: Self-pay

## 2021-08-29 ENCOUNTER — Encounter: Payer: Self-pay | Admitting: Cardiology

## 2021-09-19 ENCOUNTER — Other Ambulatory Visit: Payer: Self-pay | Admitting: Cardiovascular Disease

## 2021-09-19 DIAGNOSIS — I10 Essential (primary) hypertension: Secondary | ICD-10-CM

## 2021-09-20 ENCOUNTER — Other Ambulatory Visit: Payer: Self-pay

## 2021-09-20 MED ORDER — CLONIDINE HCL 0.1 MG PO TABS
0.1000 mg | ORAL_TABLET | Freq: Two times a day (BID) | ORAL | 5 refills | Status: DC
  Filled 2021-09-20: qty 60, 30d supply, fill #0
  Filled 2021-10-23: qty 60, 30d supply, fill #1
  Filled 2021-10-25: qty 60, 30d supply, fill #0
  Filled 2021-11-22: qty 60, 30d supply, fill #1
  Filled 2021-12-25: qty 60, 30d supply, fill #2
  Filled 2022-01-23: qty 60, 30d supply, fill #3
  Filled 2022-02-26: qty 60, 30d supply, fill #4

## 2021-09-22 ENCOUNTER — Other Ambulatory Visit: Payer: Self-pay

## 2021-09-23 ENCOUNTER — Other Ambulatory Visit: Payer: Self-pay

## 2021-10-07 ENCOUNTER — Other Ambulatory Visit: Payer: Self-pay

## 2021-10-07 MED ORDER — TELMISARTAN 20 MG PO TABS
20.0000 mg | ORAL_TABLET | Freq: Every day | ORAL | 3 refills | Status: DC
  Filled 2021-10-07: qty 30, 30d supply, fill #0
  Filled 2021-11-03: qty 30, 30d supply, fill #1
  Filled 2021-11-03: qty 30, 30d supply, fill #0
  Filled 2021-12-14: qty 30, 30d supply, fill #1
  Filled 2022-01-15: qty 30, 30d supply, fill #2

## 2021-10-08 ENCOUNTER — Other Ambulatory Visit: Payer: Self-pay

## 2021-10-12 ENCOUNTER — Other Ambulatory Visit: Payer: Self-pay

## 2021-10-23 MED FILL — Isosorbide Mononitrate Tab ER 24HR 30 MG: ORAL | 90 days supply | Qty: 90 | Fill #2 | Status: CN

## 2021-10-25 ENCOUNTER — Other Ambulatory Visit: Payer: Self-pay

## 2021-10-25 MED FILL — Isosorbide Mononitrate Tab ER 24HR 30 MG: ORAL | 90 days supply | Qty: 90 | Fill #0 | Status: AC

## 2021-11-01 ENCOUNTER — Other Ambulatory Visit: Payer: Self-pay

## 2021-11-03 ENCOUNTER — Ambulatory Visit: Payer: Self-pay | Attending: Internal Medicine

## 2021-11-03 ENCOUNTER — Other Ambulatory Visit: Payer: Self-pay

## 2021-11-03 DIAGNOSIS — Z23 Encounter for immunization: Secondary | ICD-10-CM

## 2021-11-04 ENCOUNTER — Other Ambulatory Visit: Payer: Self-pay

## 2021-11-04 ENCOUNTER — Other Ambulatory Visit (HOSPITAL_BASED_OUTPATIENT_CLINIC_OR_DEPARTMENT_OTHER): Payer: Self-pay

## 2021-11-04 MED ORDER — MODERNA COVID-19 BIVAL BOOSTER 50 MCG/0.5ML IM SUSP
INTRAMUSCULAR | 0 refills | Status: DC
  Filled 2021-11-04: qty 0.5, 1d supply, fill #0

## 2021-11-04 NOTE — Progress Notes (Signed)
° °  Covid-19 Vaccination Clinic  Name:  Bobby Stevens    MRN: [id number] DOB: February 04, 1971  11/04/2021  [name] was observed post Covid-19 immunization for 15 minutes without incident. He was provided with Vaccine Information Sheet and instruction to access the V-Safe system.   [name] was instructed to call 911 with any severe reactions post vaccine: Difficulty breathing  Swelling of face and throat  A fast heartbeat  A bad rash all over body  Dizziness and weakness   Immunizations Administered     Name Date Dose VIS Date Route   Moderna Covid-19 vaccine Bivalent Booster 11/03/2021  9:34 AM 0.5 mL 05/29/2021 Intramuscular   Manufacturer: Levan Hurst   Lot: [id number]   [place]: [id number]

## 2021-11-08 ENCOUNTER — Other Ambulatory Visit: Payer: Self-pay

## 2021-11-15 ENCOUNTER — Other Ambulatory Visit: Payer: Self-pay

## 2021-11-15 ENCOUNTER — Ambulatory Visit (HOSPITAL_BASED_OUTPATIENT_CLINIC_OR_DEPARTMENT_OTHER): Payer: BLUE CROSS/BLUE SHIELD | Admitting: Cardiology

## 2021-11-15 ENCOUNTER — Encounter (HOSPITAL_BASED_OUTPATIENT_CLINIC_OR_DEPARTMENT_OTHER): Payer: Self-pay | Admitting: Cardiology

## 2021-11-15 VITALS — BP 120/72 | HR 79 | Ht 73.0 in | Wt 229.7 lb

## 2021-11-15 DIAGNOSIS — I1A0 Resistant hypertension: Secondary | ICD-10-CM

## 2021-11-15 DIAGNOSIS — Z7189 Other specified counseling: Secondary | ICD-10-CM

## 2021-11-15 DIAGNOSIS — I129 Hypertensive chronic kidney disease with stage 1 through stage 4 chronic kidney disease, or unspecified chronic kidney disease: Secondary | ICD-10-CM

## 2021-11-15 DIAGNOSIS — N184 Chronic kidney disease, stage 4 (severe): Secondary | ICD-10-CM | POA: Diagnosis not present

## 2021-11-15 DIAGNOSIS — I119 Hypertensive heart disease without heart failure: Secondary | ICD-10-CM | POA: Diagnosis not present

## 2021-11-15 DIAGNOSIS — I10 Essential (primary) hypertension: Secondary | ICD-10-CM | POA: Diagnosis not present

## 2021-11-15 DIAGNOSIS — I43 Cardiomyopathy in diseases classified elsewhere: Secondary | ICD-10-CM

## 2021-11-15 NOTE — Progress Notes (Signed)
Cardiology Office Note:    Date:  11/16/2021   ID:  Bobby Stevens, DOB Feb 23, 1971, MRN [id number]  PCP:  Kerin Perna, NP  Cardiologist:  Buford Dresser, MD  Nephrologist: Dr. Candiss Norse  Referring MD: Kerin Perna, NP   CC: follow up  History of Present Illness:    Bobby Stevens is a 51 y.o. male with a hx of resistant hypertension with hospitalization for hypertensive urgency, cardiomyopathy with recovered EF, chronic kidney disease stage 4.  I met him in the hospital 04/2020. He has also been followed by our advanced hypertension clinic.  Today: Overall, he is feeling pretty good. At home his blood pressure has been well-controlled.  While going upstairs to his daughter's apartment he will become winded. Otherwise he does not get short of breath.  Since his last visit he endorses gaining weight that he attributes to his diet.  Lately he has had some difficulty sleeping due to frequent nocturia caused by his medication.  He was started on 20 mg telmisartan and this has been working well for him. He follows with his nephrologist every 3 months.  He denies any palpitations. No lightheadedness, headaches, syncope, orthopnea, PND, or lower extremity edema.   Past Medical History:  Diagnosis Date   Hypertension 2020   Resistant hypertension 02/10/2021    Past Surgical History:  Procedure Laterality Date   NO PAST SURGERIES     None      Current Medications: Current Outpatient Medications on File Prior to Visit  Medication Sig   amLODipine (NORVASC) 10 MG tablet Take 1 tablet (10 mg total) by mouth daily.   carvedilol (COREG) 25 MG tablet Take 1 tablet (25 mg total) by mouth 2 (two) times daily.   chlorthalidone (HYGROTON) 25 MG tablet TAKE 0.5 TABLET BY MOUTH ONCE A DAY   cloNIDine (CATAPRES) 0.1 MG tablet Take 1 tablet (0.1 mg total) by mouth 2 (two) times daily.   COVID-19 mRNA bivalent vaccine, Moderna, (MODERNA COVID-19 BIVAL BOOSTER) 50  MCG/0.5ML injection Inject into the muscle.   Difluprednate 0.05 % EMUL Place 1 drop into the left eye every 4 hours.   doxazosin (CARDURA) 4 MG tablet Take 1 tablet (4 mg total) by mouth 2 (two) times daily.   hydrALAZINE (APRESOLINE) 100 MG tablet Take 1 tablet (100 mg total) by mouth 3 (three) times daily.   isosorbide mononitrate (IMDUR) 30 MG 24 hr tablet TAKE 1 TABLET BY MOUTH ONCE A DAY   telmisartan (MICARDIS) 20 MG tablet Take 1 tablet (20 mg total) by mouth daily.   vitamin B-12 (CYANOCOBALAMIN) 250 MCG tablet Take 250 mcg by mouth daily.   No current facility-administered medications on file prior to visit.     Allergies:   Penicillins   Social History   Tobacco Use   Smoking status: Former   Smokeless tobacco: Never  Substance Use Topics   Alcohol use: Not Currently   Drug use: Never    Family History: family history includes Hypertension in his brother, father, maternal grandmother, mother, paternal grandmother, and paternal uncle.  ROS:   Please see the history of present illness.   (+) Shortness of breath Additional pertinent ROS otherwise unremarkable.    EKGs/Labs/Other Studies Reviewed:    The following studies were reviewed today:  Echo 03/31/2021:  1. Left ventricular ejection fraction, by estimation, is 55 to 60%. Left  ventricular ejection fraction by 3D volume is 58 %. The left ventricle has  normal function. The left ventricle  has no regional wall motion  abnormalities. There is mild left  ventricular hypertrophy. Left ventricular diastolic parameters are  consistent with Grade I diastolic dysfunction (impaired relaxation).   2. Right ventricular systolic function is normal. The right ventricular  size is normal.   3. The mitral valve is grossly normal. Trivial mitral valve  regurgitation.   4. The aortic valve is tricuspid. Aortic valve regurgitation is not  visualized.   5. Aortic dilatation noted. There is mild dilatation of the aortic root,   measuring 41 mm. There is mild dilatation of the ascending aorta,  measuring 41 mm.   6. The inferior vena cava is normal in size with greater than 50%  respiratory variability, suggesting right atrial pressure of 3 mmHg.   Comparison(s): 05/10/20 EF 25-30%.   Bilateral Renal Artery Doppler 05/13/2020: Summary:  Renal:     Right: No evidence of right renal artery stenosis. Abnormal right         Resistive Index. Normal size right kidney.  Left:  No evidence of left renal artery stenosis. Normal left         Resistive Index. Normal size of left kidney.  Mesenteric:  Areas of limited visceral study include right parenchymal flow and left  parenchymal flow.   Echo 05/10/20 1. Left ventricular ejection fraction, by estimation, is 25 to 30%. The  left ventricle has severely decreased function. The left ventricle  demonstrates global hypokinesis. There is moderate concentric left  ventricular hypertrophy. Left ventricular  diastolic parameters are consistent with Grade III diastolic dysfunction  (restrictive). Elevated left ventricular end-diastolic pressure.   2. Right ventricular systolic function is mildly reduced. The right  ventricular size is normal. There is moderately elevated pulmonary artery  systolic pressure.   3. Left atrial size was moderately dilated.   4. Right atrial size was mildly dilated.   5. The mitral valve is normal in structure. Mild to moderate mitral valve  regurgitation.   6. Tricuspid valve regurgitation is moderate.   7. The aortic valve is grossly normal. Aortic valve regurgitation is not  visualized. No aortic stenosis is present.   8. Aortic dilatation noted. There is mild to moderate dilatation of the  ascending aorta measuring 43 mm.   9. The inferior vena cava is dilated in size with <50% respiratory  variability, suggesting right atrial pressure of 15 mmHg.   EKG:  EKG is personally reviewed.   11/15/2021: EKG was not ordered. 05/09/20: sinus  tach at 115 bpm, LVH with nonspecific changes, biatrial enlargement  Recent Labs: No results found for requested labs within last 8760 hours.   Recent Lipid Panel No results found for: CHOL, TRIG, HDL, CHOLHDL, VLDL, LDLCALC, LDLDIRECT  Physical Exam:    VS:  BP 120/72    Pulse 79    Ht _0  (1.854 m)    Wt 229 lb 11.2 oz (104.2 kg)    SpO2 98%    BMI 30.31 kg/m     Wt Readings from Last 3 Encounters:  11/15/21 229 lb 11.2 oz (104.2 kg)  08/16/21 224 lb 6.4 oz (101.8 kg)  05/12/21 220 lb 6.4 oz (100 kg)    GEN: Well nourished, well developed in no acute distress HEENT: Normal, moist mucous membranes NECK: No JVD CARDIAC: regular rhythm, normal S1 and S2, no rubs or gallops. No murmur. VASCULAR: Radial and DP pulses 2+ bilaterally. No carotid bruits RESPIRATORY:  Clear to auscultation without rales, wheezing or rhonchi  ABDOMEN: Soft, non-tender, non-distended  MUSCULOSKELETAL:  Ambulates independently SKIN: Warm and dry, no edema NEUROLOGIC:  Alert and oriented x 3. No focal neuro deficits noted. PSYCHIATRIC:  Normal affect   ASSESSMENT:    1. Resistant hypertension   2. Cardiomyopathy due to hypertension, without heart failure ([place])   3. CKD (chronic kidney disease) stage 4, GFR 15-29 ml/min (HCC)   4. Hypertensive nephropathy   5. Cardiac risk counseling   6. Counseling on health promotion and disease prevention     PLAN:    Hypertension, resistant Hypertensive cardiomyopathy, now with recovered EF Hypertensive nephrology, with chronic kidney disease stage 4 -excellent blood pressure control. EF improved on most recent echo -appreciate assistance from advanced hypertension clinic -doing well on current regimen of telmisartan 20 mg daily, clonidine 0.1 mg BID, hydralazine 100 mg TID, amlodipine 10 mg daily, carvedilol 25 mg BID, doxazosin 4 mg BID, imdur 30 mg daily, chlorthalidone 12.5 mg daily -has been following at [facility] for renal transplant, declined based on prior  note from Dr. Candiss Norse, but I am hopeful for his options long term. He has been extremely motivated and adherent in my experience with him  Cardiac risk counseling and prevention recommendations: -recommend heart healthy/Mediterranean diet, with whole grains, fruits, vegetable, fish, lean meats, nuts, and olive oil. Limit salt. -recommend moderate walking, 3-5 times/week for 30-50 minutes each session. Aim for at least 150 minutes.week. Goal should be pace of 3 miles/hours, or walking 1.5 miles in 30 minutes -recommend avoidance of tobacco products. Avoid excess alcohol. -ASCVD risk score: The ASCVD Risk score (Arnett DK, et al., 2019) failed to calculate for the following reasons:   Cannot find a previous HDL lab   Cannot find a previous total cholesterol lab    Plan for follow up: 6 mos or sooner as needed.  Buford Dresser, MD, PhD [place]   [facility]    Medication Adjustments/Labs and Tests Ordered: Current medicines are reviewed at length with the patient today.  Concerns regarding medicines are outlined above.   No orders of the defined types were placed in this encounter.  No orders of the defined types were placed in this encounter.  Patient Instructions  Medication Instructions:  Your Physician recommend you continue on your current medication as directed.    *If you need a refill on your cardiac medications before your next appointment, please call your pharmacy*   Lab Work: None ordered today   Testing/Procedures: None ordered today   Follow-Up: At [facility], you and your health needs are our priority.  As part of our continuing mission to provide you with exceptional heart care, we have created designated Provider Care Teams.  These Care Teams include your primary Cardiologist (physician) and Advanced Practice Providers (APPs -  Physician Assistants and Nurse Practitioners) who all work together to provide you with the care you need, when you need  it.  We recommend signing up for the patient portal called "MyChart".  Sign up information is provided on this After Visit Summary.  MyChart is used to connect with patients for Virtual Visits (Telemedicine).  Patients are able to view lab/test results, encounter notes, upcoming appointments, etc.  Non-urgent messages can be sent to your provider as well.   To learn more about what you can do with MyChart, go to [email or web address].    Your next appointment:   6 month(s)  The format for your next appointment:   In Person  Provider:   Buford Dresser, MD      [facility]  Stumpf,acting as a Education administrator for [employer], MD.,have documented all relevant documentation on the behalf of Buford Dresser, MD,as directed by  Buford Dresser, MD while in the presence of Buford Dresser, MD.  I, Buford Dresser, MD, have reviewed all documentation for this visit. The documentation on 11/16/21 for the exam, diagnosis, procedures, and orders are all accurate and complete.   Signed, Buford Dresser, MD PhD 11/16/2021  [place]

## 2021-11-15 NOTE — Patient Instructions (Signed)

## 2021-11-16 ENCOUNTER — Encounter (HOSPITAL_BASED_OUTPATIENT_CLINIC_OR_DEPARTMENT_OTHER): Payer: Self-pay | Admitting: Cardiology

## 2021-11-23 ENCOUNTER — Other Ambulatory Visit: Payer: Self-pay

## 2021-11-24 ENCOUNTER — Other Ambulatory Visit: Payer: Self-pay

## 2021-11-29 ENCOUNTER — Other Ambulatory Visit: Payer: Self-pay

## 2021-11-30 ENCOUNTER — Other Ambulatory Visit: Payer: Self-pay

## 2021-12-14 ENCOUNTER — Other Ambulatory Visit: Payer: Self-pay

## 2021-12-15 ENCOUNTER — Other Ambulatory Visit: Payer: Self-pay

## 2021-12-16 ENCOUNTER — Other Ambulatory Visit: Payer: Self-pay

## 2021-12-27 ENCOUNTER — Other Ambulatory Visit: Payer: Self-pay

## 2022-01-03 ENCOUNTER — Other Ambulatory Visit: Payer: Self-pay

## 2022-01-06 ENCOUNTER — Other Ambulatory Visit: Payer: Self-pay

## 2022-01-17 ENCOUNTER — Other Ambulatory Visit: Payer: Self-pay

## 2022-01-18 ENCOUNTER — Other Ambulatory Visit: Payer: Self-pay

## 2022-01-23 ENCOUNTER — Other Ambulatory Visit (INDEPENDENT_AMBULATORY_CARE_PROVIDER_SITE_OTHER): Payer: Self-pay

## 2022-01-24 ENCOUNTER — Other Ambulatory Visit: Payer: Self-pay

## 2022-01-27 ENCOUNTER — Other Ambulatory Visit: Payer: Self-pay

## 2022-01-27 MED ORDER — ISOSORBIDE MONONITRATE ER 30 MG PO TB24
ORAL_TABLET | ORAL | 3 refills | Status: DC
  Filled 2022-01-27: qty 90, 90d supply, fill #0
  Filled 2022-05-13: qty 90, 90d supply, fill #1

## 2022-01-28 ENCOUNTER — Other Ambulatory Visit: Payer: Self-pay

## 2022-02-01 ENCOUNTER — Other Ambulatory Visit: Payer: Self-pay

## 2022-02-03 ENCOUNTER — Other Ambulatory Visit: Payer: Self-pay

## 2022-02-09 ENCOUNTER — Other Ambulatory Visit: Payer: Self-pay

## 2022-02-26 ENCOUNTER — Other Ambulatory Visit (INDEPENDENT_AMBULATORY_CARE_PROVIDER_SITE_OTHER): Payer: Self-pay

## 2022-02-28 ENCOUNTER — Other Ambulatory Visit: Payer: Self-pay

## 2022-02-28 MED ORDER — TELMISARTAN 20 MG PO TABS
ORAL_TABLET | ORAL | 3 refills | Status: DC
  Filled 2022-02-28: qty 30, 30d supply, fill #0
  Filled 2022-03-23 – 2022-04-07 (×3): qty 30, 30d supply, fill #1
  Filled 2022-05-05: qty 30, 30d supply, fill #2
  Filled 2022-06-19: qty 30, 30d supply, fill #3

## 2022-03-01 ENCOUNTER — Other Ambulatory Visit: Payer: Self-pay

## 2022-03-03 ENCOUNTER — Encounter (HOSPITAL_BASED_OUTPATIENT_CLINIC_OR_DEPARTMENT_OTHER): Payer: Self-pay

## 2022-03-15 ENCOUNTER — Other Ambulatory Visit: Payer: Self-pay

## 2022-03-17 ENCOUNTER — Other Ambulatory Visit: Payer: Self-pay

## 2022-03-22 IMAGING — DX DG CHEST 1V PORT
1 series · 1 of 1 positions shown · non-contrast
Comparison: Portable exam 7797 hours without priors for comparison

CLINICAL DATA: Generalized weakness and fatigue for 2 weeks,
hypertension

EXAM:
PORTABLE CHEST 1 VIEW

[chest ap]
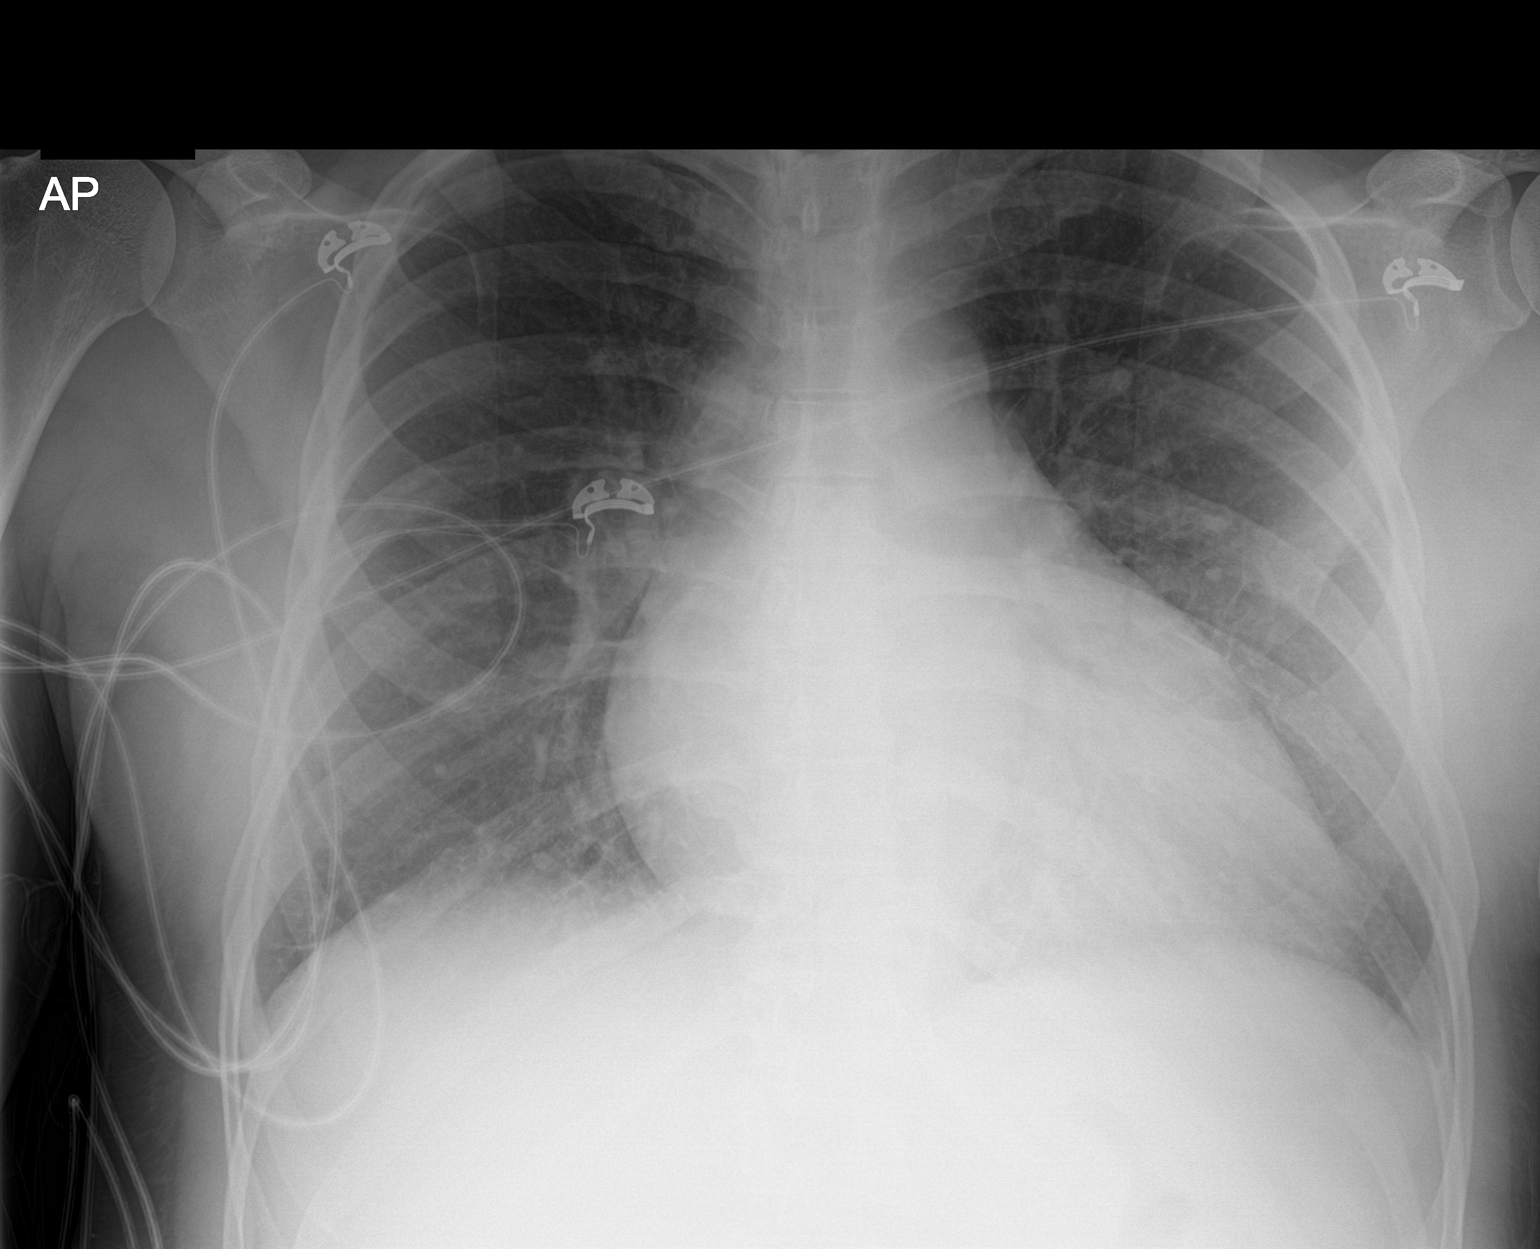

[1 of 1 positions shown; findings below may reference images not displayed]

FINDINGS: Enlargement of cardiac silhouette.

Mediastinal contours and pulmonary vascularity normal.

Minimal RIGHT basilar atelectasis.

Remaining lungs clear.

No infiltrate, pleural effusion or pneumothorax.

Osseous structures unremarkable.
IMPRESSION: Enlargement of cardiac silhouette with minimal RIGHT basilar
atelectasis.

## 2022-03-23 ENCOUNTER — Other Ambulatory Visit: Payer: Self-pay

## 2022-03-23 IMAGING — US US RENAL
1 series · 14 of 25 positions shown · non-contrast
Comparison: None.

CLINICAL DATA: Renal insufficiency.

EXAM:
RENAL / URINARY TRACT ULTRASOUND COMPLETE

[Series 1: us renal · 14 of 41 slices shown]
[im 1/41]
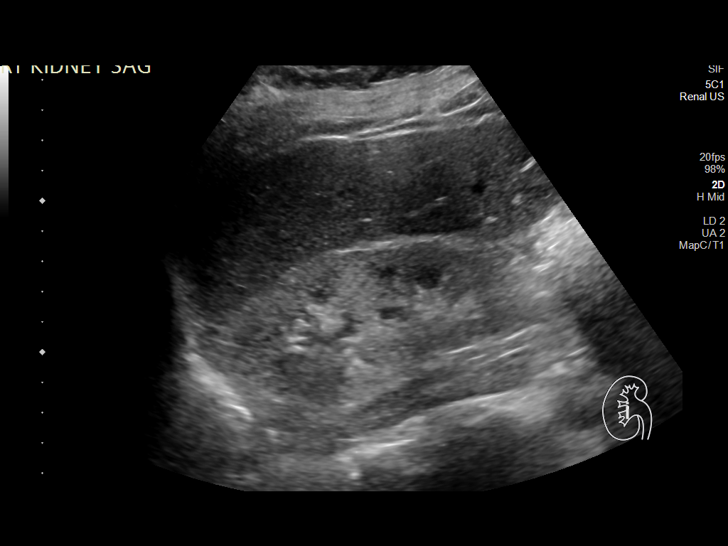
[im 4/41]
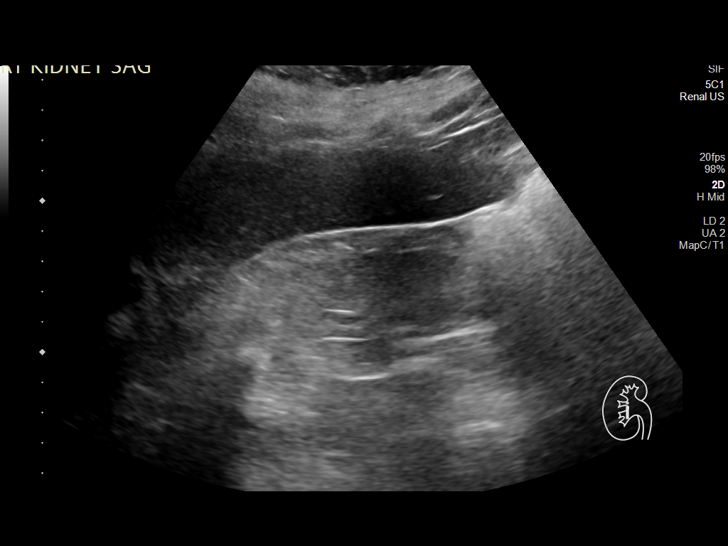
[im 7/41]
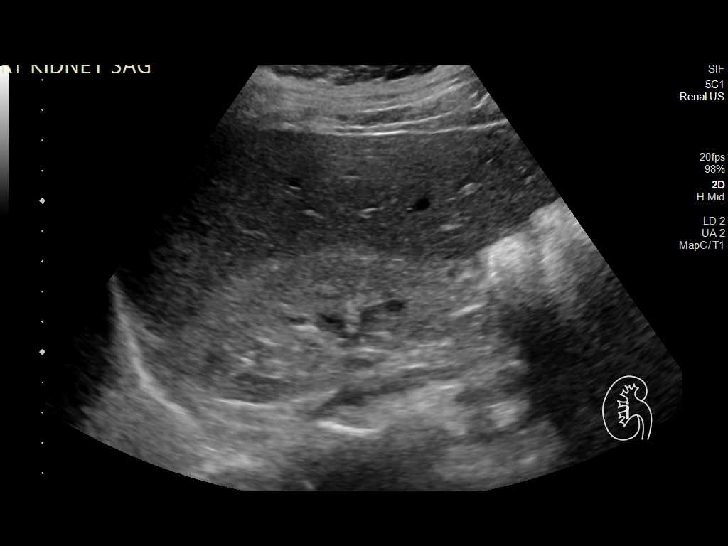
[im 11/41]
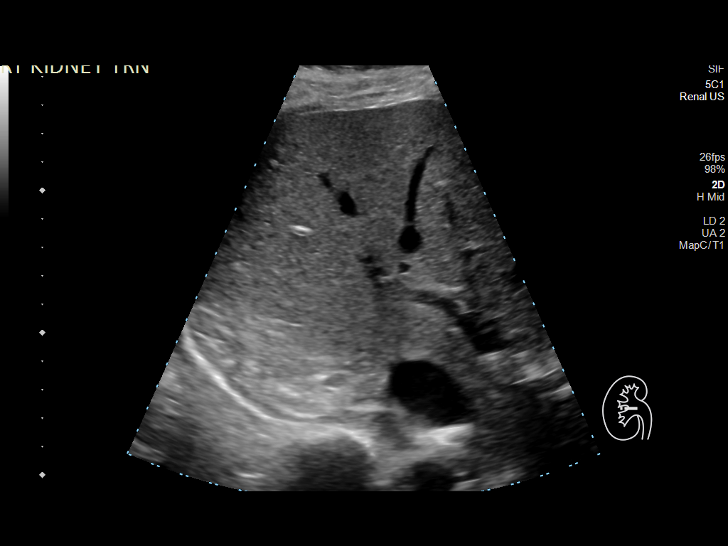
[im 14/41]
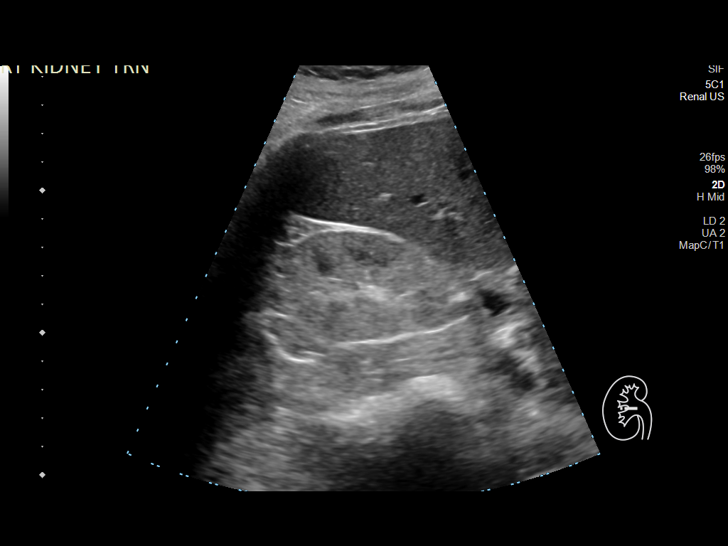
[im 16/41]
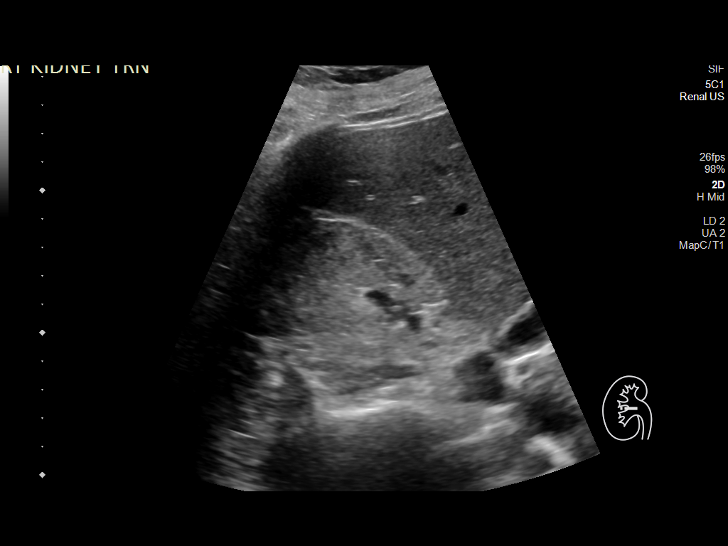
[im 19/41]
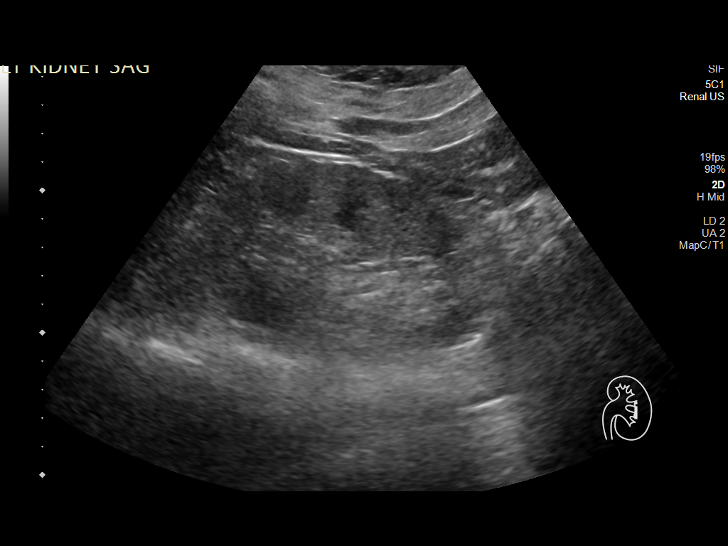
[im 22/41]
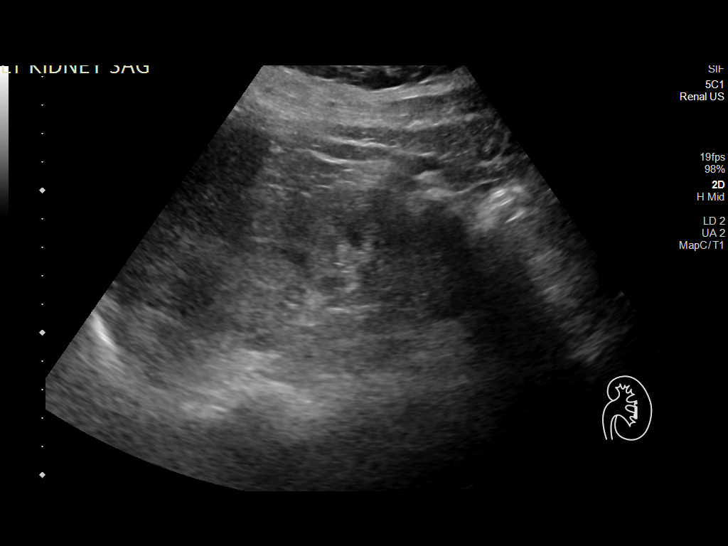
[im 26/41]
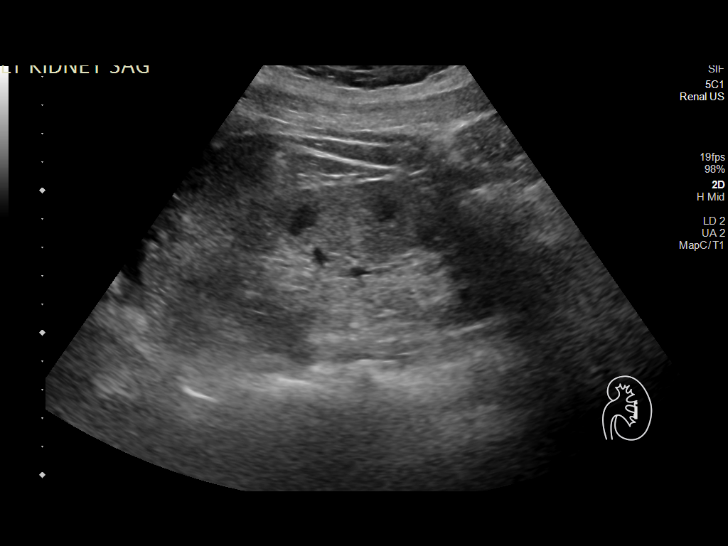
[im 27/41]
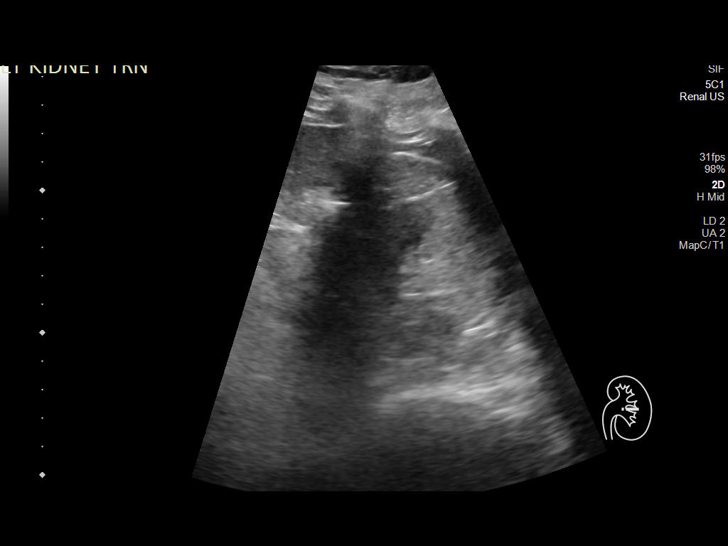
[im 31/41]
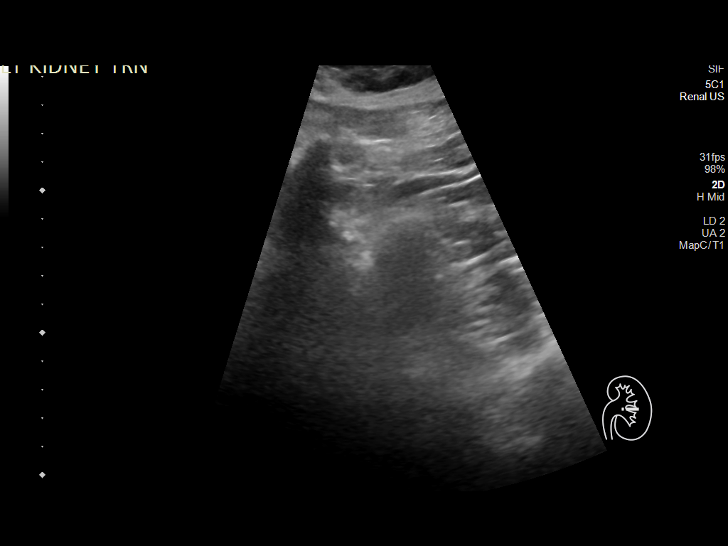
[im 34/41]
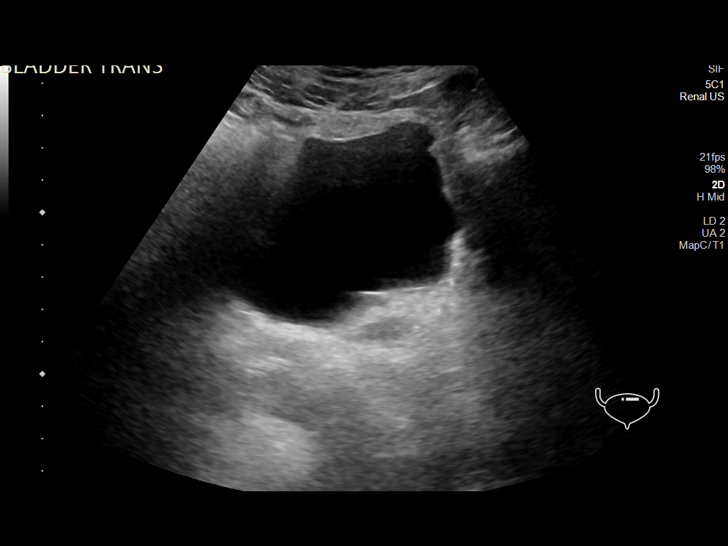
[im 37/41]
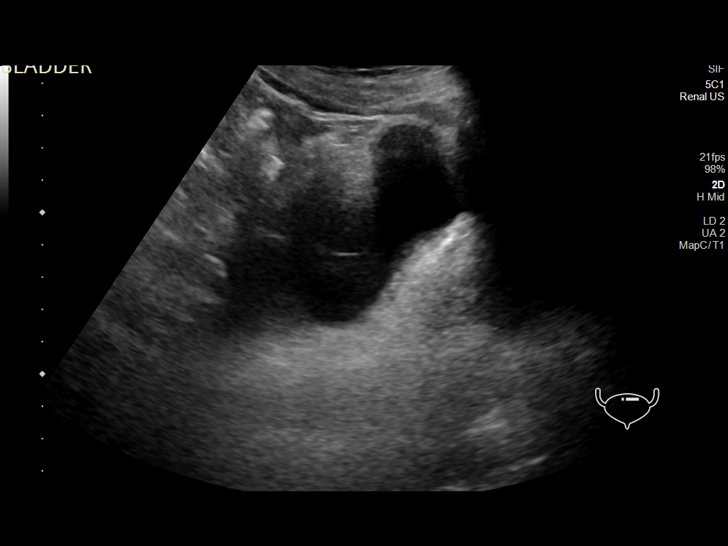
[im 41/41]
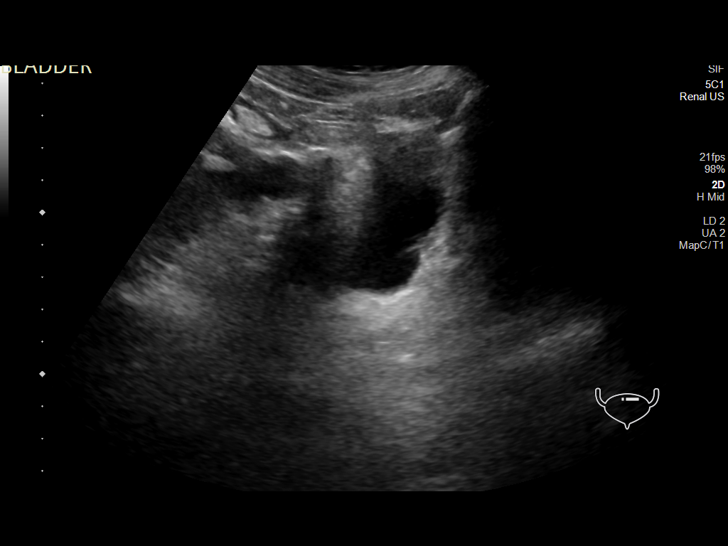

[14 of 25 positions shown; findings below may reference images not displayed]

FINDINGS: Right Kidney:

Renal measurements: 10.9 x 4.4 x 3.7 cm = volume: 93 mL. Moderate
increased cortical echogenicity. No mass or hydronephrosis
visualized.

Left Kidney:

Renal measurements: 10.3 x 5.8 x 3.3 cm = volume: 101 mL. Mild
increased cortical echogenicity. No mass or hydronephrosis
visualized.

Bladder:

Appears normal for degree of bladder distention.

Other:

None.
IMPRESSION: Normal size kidneys with increased cortical echogenicity which can
be seen in medical renal disease. No hydronephrosis.

## 2022-03-29 ENCOUNTER — Other Ambulatory Visit: Payer: Self-pay

## 2022-03-31 ENCOUNTER — Other Ambulatory Visit: Payer: Self-pay

## 2022-04-01 ENCOUNTER — Other Ambulatory Visit: Payer: Self-pay

## 2022-04-07 ENCOUNTER — Other Ambulatory Visit: Payer: Self-pay

## 2022-04-27 ENCOUNTER — Other Ambulatory Visit: Payer: Self-pay

## 2022-05-06 ENCOUNTER — Other Ambulatory Visit: Payer: Self-pay

## 2022-05-09 ENCOUNTER — Other Ambulatory Visit: Payer: Self-pay

## 2022-05-13 ENCOUNTER — Other Ambulatory Visit: Payer: Self-pay

## 2022-05-19 ENCOUNTER — Other Ambulatory Visit: Payer: Self-pay

## 2022-05-30 ENCOUNTER — Ambulatory Visit (HOSPITAL_BASED_OUTPATIENT_CLINIC_OR_DEPARTMENT_OTHER): Payer: BLUE CROSS/BLUE SHIELD | Admitting: Cardiology

## 2022-05-30 ENCOUNTER — Encounter (HOSPITAL_BASED_OUTPATIENT_CLINIC_OR_DEPARTMENT_OTHER): Payer: Self-pay | Admitting: Cardiology

## 2022-05-30 VITALS — BP 128/84 | HR 65 | Ht 73.0 in | Wt 209.4 lb

## 2022-05-30 DIAGNOSIS — I10 Essential (primary) hypertension: Secondary | ICD-10-CM

## 2022-05-30 DIAGNOSIS — I119 Hypertensive heart disease without heart failure: Secondary | ICD-10-CM

## 2022-05-30 DIAGNOSIS — N184 Chronic kidney disease, stage 4 (severe): Secondary | ICD-10-CM | POA: Diagnosis not present

## 2022-05-30 DIAGNOSIS — I129 Hypertensive chronic kidney disease with stage 1 through stage 4 chronic kidney disease, or unspecified chronic kidney disease: Secondary | ICD-10-CM | POA: Diagnosis not present

## 2022-05-30 DIAGNOSIS — I43 Cardiomyopathy in diseases classified elsewhere: Secondary | ICD-10-CM

## 2022-05-30 NOTE — Progress Notes (Signed)
Cardiology Office Note:    Date:  05/30/2022   ID:  Bobby Stevens, DOB 10-13-1971, MRN [id number]  PCP:  Kerin Perna, NP  Cardiologist:  Buford Dresser, MD  Nephrologist: Dr. Candiss Norse  Referring MD: Kerin Perna, NP   CC: follow up  History of Present Illness:    Bobby Stevens is a 51 y.o. male with a hx of resistant hypertension with hospitalization for hypertensive urgency, cardiomyopathy with recovered EF, chronic kidney disease stage 4.  I met him in the hospital 04/2020. He has also been followed by our advanced hypertension clinic.  At his last appointment, he reported at home blood pressures were well-controlled. While going upstairs to his daughter's apartment he would become winded. He also complained of some difficulty sleeping due to frequent nocturia caused by his medication. He was started on 20 mg telmisartan which had been working well for him. He follows with his nephrologist every 3 months.  Today: He states that he is feeling okay. He is concerned that his diastolic blood pressure is elevated in clinic today (126/100). At home he has average blood pressures of 116/65, once down to 088 systolic. He denies any high numbers at all. He believes that Dr. Candiss Norse took him off the clonidine at his last visit, and he has done well since.  He has successfully lost weight. He continues to do well with routine walking, exercising, and following a healthy diet. Typically he drinks a lot of water, or cranberry juice. He avoids all soda and teas. In the past month his most strenuous activity was mowing the yard with a push mower. 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   He denies any palpitations, chest pain, shortness of breath, or peripheral edema. No lightheadedness, headaches, syncope, orthopnea, or PND.    Past Medical History:  Diagnosis Date   Hypertension 2020   Resistant hypertension 02/10/2021    Past Surgical History:  Procedure  Laterality Date   NO PAST SURGERIES     None      Current Medications: Current Outpatient Medications on File Prior to Visit  Medication Sig   amLODipine (NORVASC) 10 MG tablet Take 1 tablet (10 mg total) by mouth daily.   carvedilol (COREG) 25 MG tablet Take 1 tablet (25 mg total) by mouth 2 (two) times daily.   chlorthalidone (HYGROTON) 25 MG tablet TAKE 0.5 TABLET BY MOUTH ONCE A DAY   Difluprednate 0.05 % EMUL Place 1 drop into the left eye every 4 hours.   doxazosin (CARDURA) 4 MG tablet Take 1 tablet (4 mg total) by mouth 2 (two) times daily.   hydrALAZINE (APRESOLINE) 100 MG tablet Take 1 tablet (100 mg total) by mouth 3 (three) times daily.   isosorbide mononitrate (IMDUR) 30 MG 24 hr tablet take 1 tablet by mouth daily   telmisartan (MICARDIS) 20 MG tablet take 1 tablet by mouth daily   vitamin B-12 (CYANOCOBALAMIN) 250 MCG tablet Take 250 mcg by mouth daily.   No current facility-administered medications on file prior to visit.     Allergies:   Penicillins   Social History   Tobacco Use   Smoking status: Former   Smokeless tobacco: Never  Substance Use Topics   Alcohol use: Not Currently   Drug use: Never    Family History: family history includes Hypertension in his brother, father, maternal grandmother, mother, paternal grandmother, and paternal uncle.  ROS:   Please see the history of present illness.  Additional pertinent ROS otherwise unremarkable.    EKGs/Labs/Other Studies Reviewed:    The following studies were reviewed today:  Echo 03/31/2021:  1. Left ventricular ejection fraction, by estimation, is 55 to 60%. Left  ventricular ejection fraction by 3D volume is 58 %. The left ventricle has  normal function. The left ventricle has no regional wall motion  abnormalities. There is mild left  ventricular hypertrophy. Left ventricular diastolic parameters are  consistent with Grade I diastolic dysfunction (impaired relaxation).   2. Right ventricular  systolic function is normal. The right ventricular  size is normal.   3. The mitral valve is grossly normal. Trivial mitral valve  regurgitation.   4. The aortic valve is tricuspid. Aortic valve regurgitation is not  visualized.   5. Aortic dilatation noted. There is mild dilatation of the aortic root,  measuring 41 mm. There is mild dilatation of the ascending aorta,  measuring 41 mm.   6. The inferior vena cava is normal in size with greater than 50%  respiratory variability, suggesting right atrial pressure of 3 mmHg.   Comparison(s): 05/10/20 EF 25-30%.   Bilateral Renal Artery Doppler 05/13/2020: Summary:  Renal:     Right: No evidence of right renal artery stenosis. Abnormal right         Resistive Index. Normal size right kidney.  Left:  No evidence of left renal artery stenosis. Normal left         Resistive Index. Normal size of left kidney.  Mesenteric:  Areas of limited visceral study include right parenchymal flow and left  parenchymal flow.   Echo 05/10/20 1. Left ventricular ejection fraction, by estimation, is 25 to 30%. The  left ventricle has severely decreased function. The left ventricle  demonstrates global hypokinesis. There is moderate concentric left  ventricular hypertrophy. Left ventricular  diastolic parameters are consistent with Grade III diastolic dysfunction  (restrictive). Elevated left ventricular end-diastolic pressure.   2. Right ventricular systolic function is mildly reduced. The right  ventricular size is normal. There is moderately elevated pulmonary artery  systolic pressure.   3. Left atrial size was moderately dilated.   4. Right atrial size was mildly dilated.   5. The mitral valve is normal in structure. Mild to moderate mitral valve  regurgitation.   6. Tricuspid valve regurgitation is moderate.   7. The aortic valve is grossly normal. Aortic valve regurgitation is not  visualized. No aortic stenosis is present.   8. Aortic  dilatation noted. There is mild to moderate dilatation of the  ascending aorta measuring 43 mm.   9. The inferior vena cava is dilated in size with <50% respiratory  variability, suggesting right atrial pressure of 15 mmHg.   EKG:  EKG is personally reviewed.   05/30/2022:  NSR at 65 bpm, LVH 11/15/2021: EKG was not ordered. 05/09/20: sinus tach at 115 bpm, LVH with nonspecific changes, biatrial enlargement  Recent Labs: No results found for requested labs within last 365 days.   Recent Lipid Panel No results found for: "CHOL", "TRIG", "HDL", "CHOLHDL", "VLDL", "LDLCALC", "LDLDIRECT"  Physical Exam:    VS:  BP 128/84 (BP Location: Right Arm, Patient Position: Sitting, Cuff Size: Normal)   Pulse 65   Ht 6' 1"  (1.854 m)   Wt 209 lb 6.4 oz (95 kg)   BMI 27.63 kg/m     Wt Readings from Last 3 Encounters:  05/30/22 209 lb 6.4 oz (95 kg)  11/15/21 229 lb 11.2 oz (104.2 kg)  08/16/21  224 lb 6.4 oz (101.8 kg)    GEN: Well nourished, well developed in no acute distress HEENT: Normal, moist mucous membranes NECK: No JVD CARDIAC: regular rhythm, normal S1 and S2, no rubs or gallops. No murmur. VASCULAR: Radial and DP pulses 2+ bilaterally. No carotid bruits RESPIRATORY:  Clear to auscultation without rales, wheezing or rhonchi  ABDOMEN: Soft, non-tender, non-distended MUSCULOSKELETAL:  Ambulates independently SKIN: Warm and dry, no edema NEUROLOGIC:  Alert and oriented x 3. No focal neuro deficits noted. PSYCHIATRIC:  Normal affect   ASSESSMENT:    1. Resistant hypertension   2. Cardiomyopathy due to hypertension, without heart failure ([place])   3. Hypertensive nephropathy   4. CKD (chronic kidney disease) stage 4, GFR 15-29 ml/min (HCC)    PLAN:    Hypertension, resistant Hypertensive cardiomyopathy, now with recovered EF Hypertensive nephrology, with chronic kidney disease stage 4 -excellent blood pressure control. EF improved on most recent echo -appreciate assistance from  advanced hypertension clinic -doing well on current regimen of telmisartan 20 mg daily, hydralazine 100 mg TID, amlodipine 10 mg daily, carvedilol 25 mg BID, doxazosin 4 mg BID, imdur 30 mg daily, chlorthalidone 12.5 mg daily -has been following at [facility] for renal transplant, not active currently, but I am hopeful for his options long term. He has been extremely motivated and adherent in my experience with him -followed closely by Dr. Candiss Norse at [facility]  Cardiac risk counseling and prevention recommendations: -recommend heart healthy/Mediterranean diet, with whole grains, fruits, vegetable, fish, lean meats, nuts, and olive oil. Limit salt. -recommend moderate walking, 3-5 times/week for 30-50 minutes each session. Aim for at least 150 minutes.week. Goal should be pace of 3 miles/hours, or walking 1.5 miles in 30 minutes -recommend avoidance of tobacco products. Avoid excess alcohol.  Plan for follow up: 6 mos or sooner as needed.  Buford Dresser, MD, PhD, [facility]    Medication Adjustments/Labs and Tests Ordered: Current medicines are reviewed at length with the patient today.  Concerns regarding medicines are outlined above.   Orders Placed This Encounter  Procedures   EKG 12-Lead   No orders of the defined types were placed in this encounter.  Patient Instructions  Medication Instructions:  Your Physician recommend you continue on your current medication as directed.    *If you need a refill on your cardiac medications before your next appointment, please call your pharmacy*  Follow-Up: At [facility], you and your health needs are our priority.  As part of our continuing mission to provide you with exceptional heart care, we have created designated Provider Care Teams.  These Care Teams include your primary Cardiologist (physician) and Advanced Practice Providers (APPs -  Physician Assistants and Nurse Practitioners) who all work together to  provide you with the care you need, when you need it.  We recommend signing up for the patient portal called "MyChart".  Sign up information is provided on this After Visit Summary.  MyChart is used to connect with patients for Virtual Visits (Telemedicine).  Patients are able to view lab/test results, encounter notes, upcoming appointments, etc.  Non-urgent messages can be sent to your provider as well.   To learn more about what you can do with MyChart, go to [email or web address].    Your next appointment:   6 month(s)  The format for your next appointment:   In Person  Provider:   Buford Dresser, MD{  Important Information About Sugar  I,Mathew Stumpf,acting as a Education administrator for [employer], MD.,have documented all relevant documentation on the behalf of Buford Dresser, MD,as directed by  Buford Dresser, MD while in the presence of Buford Dresser, MD.  I, Buford Dresser, MD, have reviewed all documentation for this visit. The documentation on 05/30/22 for the exam, diagnosis, procedures, and orders are all accurate and complete.   Signed, Buford Dresser, MD PhD 05/30/2022  [place]

## 2022-05-30 NOTE — Patient Instructions (Signed)
Medication Instructions:  Your Physician recommend you continue on your current medication as directed.    *If you need a refill on your cardiac medications before your next appointment, please call your pharmacy*  Follow-Up: At [facility], you and your health needs are our priority.  As part of our continuing mission to provide you with exceptional heart care, we have created designated Provider Care Teams.  These Care Teams include your primary Cardiologist (physician) and Advanced Practice Providers (APPs -  Physician Assistants and Nurse Practitioners) who all work together to provide you with the care you need, when you need it.  We recommend signing up for the patient portal called "MyChart".  Sign up information is provided on this After Visit Summary.  MyChart is used to connect with patients for Virtual Visits (Telemedicine).  Patients are able to view lab/test results, encounter notes, upcoming appointments, etc.  Non-urgent messages can be sent to your provider as well.   To learn more about what you can do with MyChart, go to [email or web address].    Your next appointment:   6 month(s)  The format for your next appointment:   In Person  Provider:   Buford Dresser, MD{  Important Information About Sugar

## 2022-06-19 ENCOUNTER — Other Ambulatory Visit: Payer: Self-pay

## 2022-06-21 ENCOUNTER — Other Ambulatory Visit: Payer: Self-pay

## 2022-06-22 ENCOUNTER — Telehealth: Payer: Self-pay | Admitting: Cardiology

## 2022-06-22 ENCOUNTER — Other Ambulatory Visit: Payer: Self-pay

## 2022-06-22 MED ORDER — CARVEDILOL 25 MG PO TABS
25.0000 mg | ORAL_TABLET | Freq: Two times a day (BID) | ORAL | 3 refills | Status: DC
  Filled 2022-06-22: qty 180, 90d supply, fill #0
  Filled 2022-10-05 (×2): qty 180, 90d supply, fill #1
  Filled 2023-01-08: qty 180, 90d supply, fill #2
  Filled 2023-05-02: qty 180, 90d supply, fill #3

## 2022-06-22 MED ORDER — ISOSORBIDE MONONITRATE ER 30 MG PO TB24
ORAL_TABLET | ORAL | 3 refills | Status: DC
  Filled 2022-06-22: qty 90, fill #0
  Filled 2022-08-17 – 2022-09-07 (×2): qty 90, 90d supply, fill #0
  Filled 2022-11-01 – 2022-11-14 (×2): qty 90, 90d supply, fill #1
  Filled 2023-03-20: qty 90, 90d supply, fill #2

## 2022-06-22 MED ORDER — AMLODIPINE BESYLATE 10 MG PO TABS
10.0000 mg | ORAL_TABLET | Freq: Every day | ORAL | 3 refills | Status: DC
  Filled 2022-06-22: qty 90, 90d supply, fill #0
  Filled 2022-09-10: qty 90, 90d supply, fill #1
  Filled 2022-11-01 – 2022-12-12 (×2): qty 90, 90d supply, fill #2
  Filled 2023-03-20: qty 90, 90d supply, fill #3

## 2022-06-22 MED ORDER — DOXAZOSIN MESYLATE 4 MG PO TABS
4.0000 mg | ORAL_TABLET | Freq: Two times a day (BID) | ORAL | 3 refills | Status: DC
Start: 2022-06-22 — End: 2024-05-18
  Filled 2022-06-22: qty 180, 90d supply, fill #0
  Filled 2022-09-10: qty 180, 90d supply, fill #1
  Filled 2022-12-12: qty 180, 90d supply, fill #2
  Filled 2023-03-20: qty 180, 90d supply, fill #3

## 2022-06-22 MED ORDER — TELMISARTAN 20 MG PO TABS
20.0000 mg | ORAL_TABLET | Freq: Every day | ORAL | 3 refills | Status: DC
  Filled 2022-06-22: qty 90, 90d supply, fill #0

## 2022-06-22 MED ORDER — CHLORTHALIDONE 25 MG PO TABS
ORAL_TABLET | ORAL | 3 refills | Status: DC
  Filled 2022-06-22: qty 45, 90d supply, fill #0

## 2022-06-22 MED ORDER — HYDRALAZINE HCL 100 MG PO TABS
100.0000 mg | ORAL_TABLET | Freq: Three times a day (TID) | ORAL | 3 refills | Status: DC
Start: 2022-06-22 — End: 2023-10-30
  Filled 2022-06-22: qty 270, 90d supply, fill #0
  Filled 2022-10-05 (×2): qty 270, 90d supply, fill #1
  Filled 2023-01-08: qty 270, 90d supply, fill #2
  Filled 2023-05-02: qty 270, 90d supply, fill #3

## 2022-06-22 NOTE — Telephone Encounter (Signed)
*  STAT* If patient is at the pharmacy, call can be transferred to refill team.   1. Which medications need to be refilled? (please list name of each medication and dose if known)   amLODipine (NORVASC) 10 MG tablet    carvedilol (COREG) 25 MG tablet    chlorthalidone (HYGROTON) 25 MG tablet     doxazosin (CARDURA) 4 MG tablet (Expired)    hydrALAZINE (APRESOLINE) 100 MG tablet    isosorbide mononitrate (IMDUR) 30 MG 24 hr tablet take     telmisartan (MICARDIS) 20 MG tablet    2. Which pharmacy/location (including street and city if local pharmacy) is medication to be sent to?  [place] [facility]. Do they need a 30 day or 90 day supply?  90 day

## 2022-06-22 NOTE — Telephone Encounter (Signed)
Rx request sent to pharmacy.  

## 2022-06-23 ENCOUNTER — Other Ambulatory Visit: Payer: Self-pay

## 2022-06-23 MED ORDER — CARVEDILOL 25 MG PO TABS
25.0000 mg | ORAL_TABLET | Freq: Two times a day (BID) | ORAL | 3 refills | Status: DC
Start: 2022-06-23 — End: 2023-10-30

## 2022-08-17 ENCOUNTER — Other Ambulatory Visit: Payer: Self-pay

## 2022-08-24 ENCOUNTER — Other Ambulatory Visit: Payer: Self-pay

## 2022-09-07 ENCOUNTER — Other Ambulatory Visit: Payer: Self-pay

## 2022-09-10 ENCOUNTER — Other Ambulatory Visit: Payer: Self-pay

## 2022-09-12 ENCOUNTER — Other Ambulatory Visit: Payer: Self-pay

## 2022-09-13 ENCOUNTER — Other Ambulatory Visit: Payer: Self-pay

## 2022-09-14 ENCOUNTER — Other Ambulatory Visit: Payer: Self-pay

## 2022-09-14 MED ORDER — TELMISARTAN 20 MG PO TABS
20.0000 mg | ORAL_TABLET | Freq: Every day | ORAL | 5 refills | Status: DC
  Filled 2022-09-14: qty 30, 30d supply, fill #0
  Filled 2022-10-20 – 2022-11-01 (×2): qty 30, 30d supply, fill #1
  Filled 2022-12-12: qty 30, 30d supply, fill #2
  Filled 2023-01-08: qty 30, 30d supply, fill #3
  Filled 2023-03-20: qty 30, 30d supply, fill #4

## 2022-09-15 ENCOUNTER — Other Ambulatory Visit: Payer: Self-pay

## 2022-09-16 ENCOUNTER — Other Ambulatory Visit: Payer: Self-pay

## 2022-10-05 ENCOUNTER — Other Ambulatory Visit: Payer: Self-pay

## 2022-10-06 ENCOUNTER — Other Ambulatory Visit: Payer: Self-pay

## 2022-10-27 ENCOUNTER — Other Ambulatory Visit (HOSPITAL_BASED_OUTPATIENT_CLINIC_OR_DEPARTMENT_OTHER): Payer: Self-pay

## 2022-10-27 ENCOUNTER — Other Ambulatory Visit: Payer: Self-pay

## 2022-10-27 MED ORDER — COMIRNATY 30 MCG/0.3ML IM SUSY
PREFILLED_SYRINGE | INTRAMUSCULAR | 0 refills | Status: DC
Start: 2022-10-27 — End: 2024-05-18
  Filled 2022-10-27: qty 0.3, 1d supply, fill #0

## 2022-11-02 ENCOUNTER — Other Ambulatory Visit: Payer: Self-pay

## 2022-11-14 ENCOUNTER — Other Ambulatory Visit: Payer: Self-pay

## 2022-11-14 MED ORDER — ISOSORBIDE MONONITRATE ER 30 MG PO TB24
30.0000 mg | ORAL_TABLET | Freq: Every day | ORAL | 3 refills | Status: DC
  Filled 2022-11-14: qty 90, 90d supply, fill #0

## 2022-11-16 ENCOUNTER — Other Ambulatory Visit: Payer: Self-pay

## 2022-12-12 ENCOUNTER — Other Ambulatory Visit: Payer: Self-pay

## 2023-01-09 ENCOUNTER — Other Ambulatory Visit: Payer: Self-pay

## 2023-02-09 ENCOUNTER — Other Ambulatory Visit: Payer: Self-pay

## 2023-02-09 MED ORDER — METFORMIN HCL 500 MG PO TABS
500.0000 mg | ORAL_TABLET | Freq: Every day | ORAL | 3 refills | Status: DC
  Filled 2023-02-09: qty 30, 30d supply, fill #0

## 2023-03-23 ENCOUNTER — Other Ambulatory Visit: Payer: Self-pay

## 2023-03-24 ENCOUNTER — Other Ambulatory Visit: Payer: Self-pay

## 2023-05-03 ENCOUNTER — Other Ambulatory Visit: Payer: Self-pay

## 2023-10-30 ENCOUNTER — Emergency Department (HOSPITAL_BASED_OUTPATIENT_CLINIC_OR_DEPARTMENT_OTHER)
Admission: EM | Admit: 2023-10-30 | Discharge: 2023-10-30 | Disposition: A | Discharge status: Home / Self Care | Payer: BLUE CROSS/BLUE SHIELD | Attending: Emergency Medicine | Admitting: Emergency Medicine

## 2023-10-30 ENCOUNTER — Other Ambulatory Visit (HOSPITAL_BASED_OUTPATIENT_CLINIC_OR_DEPARTMENT_OTHER): Payer: Self-pay

## 2023-10-30 ENCOUNTER — Encounter (HOSPITAL_BASED_OUTPATIENT_CLINIC_OR_DEPARTMENT_OTHER): Payer: Self-pay

## 2023-10-30 ENCOUNTER — Emergency Department (HOSPITAL_BASED_OUTPATIENT_CLINIC_OR_DEPARTMENT_OTHER): Payer: BLUE CROSS/BLUE SHIELD

## 2023-10-30 ENCOUNTER — Other Ambulatory Visit: Payer: Self-pay

## 2023-10-30 DIAGNOSIS — N189 Chronic kidney disease, unspecified: Secondary | ICD-10-CM | POA: Diagnosis not present

## 2023-10-30 DIAGNOSIS — R519 Headache, unspecified: Secondary | ICD-10-CM | POA: Diagnosis not present

## 2023-10-30 DIAGNOSIS — Z79899 Other long term (current) drug therapy: Secondary | ICD-10-CM | POA: Insufficient documentation

## 2023-10-30 DIAGNOSIS — I161 Hypertensive emergency: Secondary | ICD-10-CM

## 2023-10-30 DIAGNOSIS — R059 Cough, unspecified: Secondary | ICD-10-CM | POA: Diagnosis present

## 2023-10-30 DIAGNOSIS — N179 Acute kidney failure, unspecified: Secondary | ICD-10-CM | POA: Diagnosis not present

## 2023-10-30 DIAGNOSIS — I160 Hypertensive urgency: Secondary | ICD-10-CM | POA: Diagnosis not present

## 2023-10-30 DIAGNOSIS — J168 Pneumonia due to other specified infectious organisms: Secondary | ICD-10-CM | POA: Diagnosis not present

## 2023-10-30 DIAGNOSIS — I129 Hypertensive chronic kidney disease with stage 1 through stage 4 chronic kidney disease, or unspecified chronic kidney disease: Secondary | ICD-10-CM | POA: Insufficient documentation

## 2023-10-30 DIAGNOSIS — Z20822 Contact with and (suspected) exposure to covid-19: Secondary | ICD-10-CM | POA: Diagnosis not present

## 2023-10-30 DIAGNOSIS — J189 Pneumonia, unspecified organism: Secondary | ICD-10-CM

## 2023-10-30 HISTORY — DX: Type 2 diabetes mellitus with unspecified complications: E11.8

## 2023-10-30 HISTORY — DX: Disorder of kidney and ureter, unspecified: N28.9

## 2023-10-30 LAB — CBC WITH DIFFERENTIAL/PLATELET
Abs Immature Granulocytes: 0.08 10*3/uL — ABNORMAL HIGH (ref 0.00–0.07)
Basophils Absolute: 0.1 10*3/uL (ref 0.0–0.1)
Basophils Relative: 0 %
Eosinophils Absolute: 0.3 10*3/uL (ref 0.0–0.5)
Eosinophils Relative: 2 %
HCT: 44.1 % (ref 39.0–52.0)
Hemoglobin: 14.2 g/dL (ref 13.0–17.0)
Immature Granulocytes: 1 %
Lymphocytes Relative: 9 %
Lymphs Abs: 1.5 10*3/uL (ref 0.7–4.0)
MCH: 26.2 pg (ref 26.0–34.0)
MCHC: 32.2 g/dL (ref 30.0–36.0)
MCV: 81.5 fL (ref 80.0–100.0)
Monocytes Absolute: 0.6 10*3/uL (ref 0.1–1.0)
Monocytes Relative: 4 %
Neutro Abs: 14.8 10*3/uL — ABNORMAL HIGH (ref 1.7–7.7)
Neutrophils Relative %: 84 %
Platelets: 366 10*3/uL (ref 150–400)
RBC: 5.41 MIL/uL (ref 4.22–5.81)
RDW: 12.5 % (ref 11.5–15.5)
WBC: 17.4 10*3/uL — ABNORMAL HIGH (ref 4.0–10.5)
nRBC: 0 % (ref 0.0–0.2)

## 2023-10-30 LAB — RESP PANEL BY RT-PCR (RSV, FLU A&B, COVID)  RVPGX2
Influenza A by PCR: NEGATIVE
Influenza B by PCR: NEGATIVE
Resp Syncytial Virus by PCR: NEGATIVE
SARS Coronavirus 2 by RT PCR: NEGATIVE

## 2023-10-30 LAB — COMPREHENSIVE METABOLIC PANEL
ALT: 14 U/L (ref 0–44)
AST: 18 U/L (ref 15–41)
Albumin: 3.2 g/dL — ABNORMAL LOW (ref 3.5–5.0)
Alkaline Phosphatase: 78 U/L (ref 38–126)
Anion gap: 11 (ref 5–15)
BUN: 39 mg/dL — ABNORMAL HIGH (ref 6–20)
CO2: 21 mmol/L — ABNORMAL LOW (ref 22–32)
Calcium: 8.5 mg/dL — ABNORMAL LOW (ref 8.9–10.3)
Chloride: 101 mmol/L (ref 98–111)
Creatinine, Ser: 4.59 mg/dL — ABNORMAL HIGH (ref 0.61–1.24)
GFR, Estimated: 14 mL/min — ABNORMAL LOW (ref >60)
Glucose, Bld: 110 mg/dL — ABNORMAL HIGH (ref 70–99)
Potassium: 3.1 mmol/L — ABNORMAL LOW (ref 3.5–5.1)
Sodium: 133 mmol/L — ABNORMAL LOW (ref 135–145)
Total Bilirubin: 0.9 mg/dL (ref 0.0–1.2)
Total Protein: 7.5 g/dL (ref 6.5–8.1)

## 2023-10-30 LAB — BRAIN NATRIURETIC PEPTIDE: B Natriuretic Peptide: 780.9 pg/mL — ABNORMAL HIGH (ref 0.0–100.0)

## 2023-10-30 LAB — TROPONIN I (HIGH SENSITIVITY): Troponin I (High Sensitivity): 101 ng/L (ref <18)

## 2023-10-30 MED ORDER — CARVEDILOL 12.5 MG PO TABS
25.0000 mg | ORAL_TABLET | Freq: Once | ORAL | Status: AC
  Administered 2023-10-30: 25 mg via ORAL
  Filled 2023-10-30: qty 2

## 2023-10-30 MED ORDER — CHLORTHALIDONE 25 MG PO TABS
12.5000 mg | ORAL_TABLET | Freq: Every day | ORAL | 3 refills | Status: DC
  Filled 2023-10-30: qty 45, 90d supply, fill #0
  Filled 2023-12-06 – 2024-04-30 (×4): qty 45, 90d supply, fill #1

## 2023-10-30 MED ORDER — AMLODIPINE BESYLATE 5 MG PO TABS
10.0000 mg | ORAL_TABLET | Freq: Once | ORAL | Status: AC
  Administered 2023-10-30: 10 mg via ORAL
  Filled 2023-10-30: qty 2

## 2023-10-30 MED ORDER — TELMISARTAN 20 MG PO TABS
20.0000 mg | ORAL_TABLET | Freq: Every day | ORAL | 3 refills | Status: DC
  Filled 2023-10-30: qty 90, 90d supply, fill #0
  Filled 2024-05-04: qty 90, 90d supply, fill #1

## 2023-10-30 MED ORDER — ISOSORBIDE MONONITRATE ER 30 MG PO TB24
30.0000 mg | ORAL_TABLET | Freq: Every day | ORAL | 3 refills | Status: AC
  Filled 2023-10-30: qty 90, 90d supply, fill #0
  Filled 2024-06-01: qty 90, 90d supply, fill #1
  Filled 2024-08-19 – 2024-08-29 (×2): qty 90, 90d supply, fill #2

## 2023-10-30 MED ORDER — AMLODIPINE BESYLATE 10 MG PO TABS
10.0000 mg | ORAL_TABLET | Freq: Every day | ORAL | 3 refills | Status: AC
  Filled 2023-10-30: qty 90, 90d supply, fill #0
  Filled 2024-06-01: qty 90, 90d supply, fill #1
  Filled 2024-08-06: qty 90, 90d supply, fill #2
  Filled 2024-10-12: qty 90, 90d supply, fill #3

## 2023-10-30 MED ORDER — CARVEDILOL 25 MG PO TABS
25.0000 mg | ORAL_TABLET | Freq: Two times a day (BID) | ORAL | 3 refills | Status: DC
  Filled 2023-10-30: qty 180, 90d supply, fill #0

## 2023-10-30 MED ORDER — HYDRALAZINE HCL 25 MG PO TABS
100.0000 mg | ORAL_TABLET | Freq: Once | ORAL | Status: AC
  Administered 2023-10-30: 100 mg via ORAL
  Filled 2023-10-30: qty 4

## 2023-10-30 MED ORDER — DOXYCYCLINE HYCLATE 100 MG PO CAPS
100.0000 mg | ORAL_CAPSULE | Freq: Two times a day (BID) | ORAL | 0 refills | Status: DC
Start: 2023-10-30 — End: 2024-05-18
  Filled 2023-10-30: qty 20, 10d supply, fill #0

## 2023-10-30 MED ORDER — HYDRALAZINE HCL 100 MG PO TABS
100.0000 mg | ORAL_TABLET | Freq: Three times a day (TID) | ORAL | 3 refills | Status: DC
  Filled 2023-10-30: qty 270, 90d supply, fill #0

## 2023-10-30 NOTE — ED Triage Notes (Signed)
 Went to urgent care for cough and chest cold and sent to ED for hypertension (260/160). Has been off his HTN medications and taking OTC sinus medications that have increased his BP. Denies headache, weakness to 1 side, blurred vision, or nose bleeding.

## 2023-10-30 NOTE — ED Notes (Signed)
 Pt elected to leave AMA. This RN educated pt that if his blood pressure does not come down he is likely to have a stroke or heart attack and die. Pt verbalized understanding and said, I'm just ready to go. Pt signed AMA paperwork. PIV removed. Pt given prescriptions for medication to be picked up at the pharmacy. Pt verbalized understanding of taking medications.   Aleck FORBES Molt, RN

## 2023-10-30 NOTE — ED Provider Notes (Signed)
 [place] [facility] Provider Note   CSN: [id number] Arrival date & time: 10/30/23  1023     History  Chief Complaint  Patient presents with   Hypertension    Bobby Stevens is a 53 y.o. male with past medical history significant for hypertension, hypertensive cardiomyopathy, CKD who presents with concern for cough, chest cold.  He was seen at an urgent care just prior to arrival and was sent to the emergency department due to severe hypertension, blood pressure 260/160.  He reports that he has not taken his blood pressure medications since October because of it makes me feel sick.  He reports he has been taking over-the-counter medication including Coricidin, Mucinex for the cough.  Denies any headache, weakness, blurred vision, nausea, vomiting, chest pain, shortness of breath.  Hypertension       Home Medications Prior to Admission medications   Medication Sig Start Date End Date Taking? Authorizing Provider  doxycycline  (VIBRAMYCIN ) 100 MG capsule Take 1 capsule (100 mg total) by mouth 2 (two) times daily. 10/30/23  Yes Bobby Hank H, PA-C  amLODipine  (NORVASC ) 10 MG tablet Take 1 tablet (10 mg total) by mouth daily. 10/30/23   Bobby Beitler H, PA-C  carvedilol  (COREG ) 25 MG tablet Take 1 tablet (25 mg total) by mouth 2 (two) times daily. 10/30/23   Bobby Marinaccio H, PA-C  chlorthalidone  (HYGROTON ) 25 MG tablet Take 0.5 tablets (12.5 mg total) by mouth daily. 10/30/23 10/29/24  Bobby Recupero H, PA-C  COVID-19 mRNA vaccine [phone] (COMIRNATY ) syringe Inject into the muscle. 10/27/22   Bobby Channel, MD  Difluprednate 0.05 % EMUL Place 1 drop into the left eye every 4 hours. 02/04/21   [provider]  doxazosin  (CARDURA ) 4 MG tablet Take 1 tablet (4 mg total) by mouth 2 (two) times daily. 06/22/22 06/22/23  Bobby Slain, MD  hydrALAZINE  (APRESOLINE ) 100 MG tablet Take 1 tablet (100 mg total) by mouth 3  (three) times daily. 10/30/23   Bobby Goetze H, PA-C  isosorbide  mononitrate (IMDUR ) 30 MG 24 hr tablet Take 1 tablet (30 mg total) by mouth daily. 10/30/23   Bobby Speelman H, PA-C  metFORMIN  (GLUCOPHAGE ) 500 MG tablet Take 1 tablet (500 mg total) by mouth daily. 02/09/23   Bobby Hoes, MD  telmisartan  (MICARDIS ) 20 MG tablet Take 1 tablet (20 mg total) by mouth daily. 10/30/23   Bobby Penning H, PA-C  vitamin B-12 (CYANOCOBALAMIN ) 250 MCG tablet Take 250 mcg by mouth daily.    [provider]      Allergies    Penicillins    Review of Systems   Review of Systems  All other systems reviewed and are negative.   Physical Exam Updated Vital Signs BP (!) 186/127   Pulse 100   Temp 98.5 F (36.9 C) (Oral)   Resp (!) 29   Ht 6' 1 (1.854 m)   Wt 86.2 kg   SpO2 99%   BMI 25.07 kg/m  Physical Exam Vitals and nursing note reviewed.  Constitutional:      General: He is not in acute distress.    Appearance: Normal appearance.  HENT:     Head: Normocephalic and atraumatic.  Eyes:     General:        Right eye: No discharge.        Left eye: No discharge.  Cardiovascular:     Rate and Rhythm: Regular rhythm. Tachycardia present.     Heart sounds: No murmur  heard.    No friction rub. No gallop.  Pulmonary:     Effort: Pulmonary effort is normal.     Breath sounds: Normal breath sounds.  Abdominal:     General: Bowel sounds are normal.     Palpations: Abdomen is soft.  Skin:    General: Skin is warm and dry.     Capillary Refill: Capillary refill takes less than 2 seconds.  Neurological:     Mental Status: He is alert and oriented to person, place, and time.  Psychiatric:        Mood and Affect: Mood normal.        Behavior: Behavior normal.     ED Results / Procedures / Treatments   Labs (all labs ordered are listed, but only abnormal results are displayed) Labs Reviewed  CBC WITH DIFFERENTIAL/PLATELET - Abnormal; Notable for the following  components:      Result Value   WBC 17.4 (*)    Neutro Abs 14.8 (*)    Abs Immature Granulocytes 0.08 (*)    All other components within normal limits  COMPREHENSIVE METABOLIC PANEL - Abnormal; Notable for the following components:   Sodium 133 (*)    Potassium 3.1 (*)    CO2 21 (*)    Glucose, Bld 110 (*)    BUN 39 (*)    Creatinine, Ser 4.59 (*)    Calcium 8.5 (*)    Albumin 3.2 (*)    GFR, Estimated 14 (*)    All other components within normal limits  BRAIN NATRIURETIC PEPTIDE - Abnormal; Notable for the following components:   B Natriuretic Peptide 780.9 (*)    All other components within normal limits  TROPONIN I (HIGH SENSITIVITY) - Abnormal; Notable for the following components:   Troponin I (High Sensitivity) 101 (*)    All other components within normal limits  RESP PANEL BY RT-PCR (RSV, FLU A&B, COVID)  RVPGX2  URINALYSIS, ROUTINE W REFLEX MICROSCOPIC  TROPONIN I (HIGH SENSITIVITY)    EKG None  Radiology DG Chest Portable 1 View Result Date: 10/30/2023 CLINICAL DATA:  Chest pain EXAM: PORTABLE CHEST 1 VIEW COMPARISON:  05/09/2020 FINDINGS: Mild cardiomegaly. Diffusely increased interstitial markings bilaterally. No focal airspace consolidation. No pleural effusion or pneumothorax. IMPRESSION: Mild cardiomegaly with diffusely increased interstitial markings bilaterally, which may reflect edema or atypical/viral infection. Electronically Signed   By: Bobby Stevens D.O.   On: 10/30/2023 12:06    Procedures Procedures    Medications Ordered in ED Medications  amLODipine  (NORVASC ) tablet 10 mg (10 mg Oral Given 10/30/23 1140)  carvedilol  (COREG ) tablet 25 mg (25 mg Oral Given 10/30/23 1140)  hydrALAZINE  (APRESOLINE ) tablet 100 mg (100 mg Oral Given 10/30/23 1140)    ED Course/ Medical Decision Making/ A&P                                 Medical Decision Making Amount and/or Complexity of Data Reviewed Labs: ordered. Radiology: ordered.  Risk Prescription  drug management.   Medical Decision Making:   Bobby Stevens is a 53 y.o. male who presented to the ED today with hypertension, cough, congestion detailed above.    External chart has been reviewed including UC visit prior to arrival, labwork, imaging from previous cardiology visits . Patient's presentation is complicated by their history of hypertensive nephropathy, hypertensive cardiomyopathy, hypertension, CKD stage IV.  Patient placed on continuous vitals and telemetry monitoring while  in ED which was reviewed periodically.  Complete initial physical exam performed, notably the patient  was severely hypertensive on arrival and tachycardic, blood pressure 254/165, heart rate 117.  He is intermittently had some tachypnea, and he has some cough and upper respiratory congestion noted on exam.  No hypoxia noted..    Reviewed and confirmed nursing documentation for past medical history, family history, social history.    Initial Assessment:   With the patient's presentation of elevated blood pressure readings, most likely diagnosis is hypertensive urgency. Other diagnoses associated with hypertensive emergency were considered including (but not limited to) intracranial hemorrhage, acute renal artery stenosis, acute kidney injury, myocardial stress, ophthalmologic emergencies. These are considered less likely due to history of present illness and physical exam findings.   This is most consistent with an acute life/limb threatening illness complicated by underlying chronic conditions. Will evaluate for hypertensive emergency as below.  Initial Plan:   Screening labs including CBC and Metabolic panel to evaluate for infectious or metabolic etiology of disease.  CXR to evaluate for structural/infectious intrathoracic pathology.  Given headache, eval for ICH with CTH EKG to evaluate for cardiac pathology. Objective evaluation as below reviewed. Considered further administration of antihypertensives  in ED, per consensus guidelines for [facility] of emergency physicians, acute treatment of hypertensive urgency alone in the emergency department is not recommended.  If patient has evidence of hypertensive emergency on objective laboratory evaluation, will reevaluate.  Will monitor blood pressure while patient awaiting above laboratory studies.  Initial Study Results:   Laboratory  All laboratory results reviewed without evidence of clinically relevant pathology.   Exceptions include: His troponin significantly elevated at 101, recommended repeat troponin but patient declined.  His BNP is elevated at 788.9 which is suspicious for heart failure exacerbation, RVP is unremarkable, negative for COVID, flu, RSV.  His CBC is notable for significant leukocytosis, white blood cell 17.4, some may be secondary to stress, and poorly controlled hypertension, but given his atypical congestion on chest x-ray and other symptoms consistent with probable pneumonia I do have suspicion for developing atypical pneumonia.  CMP notable for hyponatremia, sodium 133, testing 3.1.  Creatinine, BUN are worsened compared to baseline, recent baseline creatinine around 4.  His GFR continues to significantly diminished.  EKG EKG was reviewed independently. Rate, rhythm, axis, intervals all examined and without medically relevant abnormality.  He has hypertrophy and anterior ST elevations which are likely secondary to his hypertrophy, although cannot rule out ACS especially in context of his elevated troponin initially.  Radiology:  All images reviewed independently.  I independently plain from chest x-ray as well as CT head without contrast, chest x-ray with cardiomegaly, increased interstitial markings suspicious for fluid overload versus atypical infection, I suspect that he does indeed have an atypical infection given his other symptoms we will treat him for pneumonia.  Agree with radiology report at this time.   DG Chest  Portable 1 View Result Date: 10/30/2023 CLINICAL DATA:  Chest pain EXAM: PORTABLE CHEST 1 VIEW COMPARISON:  05/09/2020 FINDINGS: Mild cardiomegaly. Diffusely increased interstitial markings bilaterally. No focal airspace consolidation. No pleural effusion or pneumothorax. IMPRESSION: Mild cardiomegaly with diffusely increased interstitial markings bilaterally, which may reflect edema or atypical/viral infection. Electronically Signed   By: Bobby Stevens D.O.   On: 10/30/2023 12:06   Official radiology report on CT head is still pending at this time, I do not see any evidence of intracranial abnormality, we discussed as part of his conversation for leaving its  medical advice that would recommend he stay until the CT head has been completely run but patient declines.   Reassessment and Plan:   Discussed with patient that we would recommend admission for hypertensive emergency with initial blood pressure of 254/165, persistent diastolic hypertension greater than 120, tachypnea, elevated troponin, elevated BNP, patient declines admission at this time, and will leave AGAINST MEDICAL ADVICE, discussed I strongly recommend admission, but we will plan to refill his blood pressure medications, will treat him for an atypical pneumonia, and will discharge at this time AGAINST MEDICAL ADVICE.  Patient understands the risk of leaving without completing treatment including death.  Final Clinical Impression(s) / ED Diagnoses Final diagnoses:  Hypertensive emergency  AKI (acute kidney injury) (HCC)  Pneumonia due to infectious organism, unspecified laterality, unspecified part of lung    Rx / DC Orders ED Discharge Orders          Ordered    amLODipine  (NORVASC ) 10 MG tablet  Daily        10/30/23 1303    carvedilol  (COREG ) 25 MG tablet  2 times daily        10/30/23 1303    chlorthalidone  (HYGROTON ) 25 MG tablet  Daily        10/30/23 1303    hydrALAZINE  (APRESOLINE ) 100 MG tablet  3 times daily         10/30/23 1303    isosorbide  mononitrate (IMDUR ) 30 MG 24 hr tablet  Daily        10/30/23 1303    telmisartan  (MICARDIS ) 20 MG tablet  Daily        10/30/23 1303    doxycycline  (VIBRAMYCIN ) 100 MG capsule  2 times daily        10/30/23 1303              Onnie Hatchel, [place] H, PA-C 10/30/23 1328    Ruthe Cornet, DO 10/30/23 1429

## 2023-10-30 NOTE — Discharge Instructions (Addendum)
 You are on a number of blood pressure medications at home.  At the very least I think that it is wise to start taking your Imdur , carvedilol , amlodipine , and hydralazine  3 times daily, you can start with these for and talk to your primary care doctor before starting the other 2 or start taking them all again if you continue to have elevated blood pressure, systolic greater than 180, diastolic greater than 110.  As we discussed you are leaving AGAINST MEDICAL ADVICE as we are recommending admission for hypertensive emergency with evidence of damage to your heart muscle secondary to your high blood pressure and high heart rate.  Please return to emergency department if you have any chest pain, shortness of breath, new numbness, tingling, weakness, vision changes.  I am prescribing some antibiotics to cover for what looks like a probable early pneumonia.  Please take the entire course of antibiotics as prescribed even if you begin feeling better prior to finishing the entire course.  Please follow-up if you are not having resolution of your symptoms despite taking his medications.  As we discussed I recommend that you discontinue Coricidin or any medications with dextromethorphan (DM).  You can take regular Mucinex without DM, NyQuil, DayQuil, Robitussin.

## 2023-12-06 ENCOUNTER — Other Ambulatory Visit (HOSPITAL_BASED_OUTPATIENT_CLINIC_OR_DEPARTMENT_OTHER): Payer: Self-pay

## 2023-12-08 ENCOUNTER — Other Ambulatory Visit (HOSPITAL_BASED_OUTPATIENT_CLINIC_OR_DEPARTMENT_OTHER): Payer: Self-pay

## 2023-12-12 ENCOUNTER — Other Ambulatory Visit (HOSPITAL_BASED_OUTPATIENT_CLINIC_OR_DEPARTMENT_OTHER): Payer: Self-pay

## 2023-12-26 ENCOUNTER — Other Ambulatory Visit (HOSPITAL_BASED_OUTPATIENT_CLINIC_OR_DEPARTMENT_OTHER): Payer: Self-pay

## 2024-05-14 ENCOUNTER — Emergency Department (HOSPITAL_BASED_OUTPATIENT_CLINIC_OR_DEPARTMENT_OTHER)

## 2024-05-14 ENCOUNTER — Inpatient Hospital Stay (HOSPITAL_BASED_OUTPATIENT_CLINIC_OR_DEPARTMENT_OTHER)
Admission: EM | Admit: 2024-05-14 | Discharge: 2024-05-18 | DRG: 673 | Disposition: A | Discharge status: Home / Self Care | Attending: Internal Medicine | Admitting: Internal Medicine

## 2024-05-14 ENCOUNTER — Encounter (HOSPITAL_BASED_OUTPATIENT_CLINIC_OR_DEPARTMENT_OTHER): Payer: Self-pay

## 2024-05-14 ENCOUNTER — Other Ambulatory Visit: Payer: Self-pay

## 2024-05-14 DIAGNOSIS — I10 Essential (primary) hypertension: Secondary | ICD-10-CM

## 2024-05-14 DIAGNOSIS — E119 Type 2 diabetes mellitus without complications: Secondary | ICD-10-CM

## 2024-05-14 DIAGNOSIS — N186 End stage renal disease: Secondary | ICD-10-CM | POA: Diagnosis present

## 2024-05-14 DIAGNOSIS — Z91128 Patient's intentional underdosing of medication regimen for other reason: Secondary | ICD-10-CM

## 2024-05-14 DIAGNOSIS — R7989 Other specified abnormal findings of blood chemistry: Secondary | ICD-10-CM | POA: Diagnosis not present

## 2024-05-14 DIAGNOSIS — I43 Cardiomyopathy in diseases classified elsewhere: Secondary | ICD-10-CM | POA: Diagnosis present

## 2024-05-14 DIAGNOSIS — I169 Hypertensive crisis, unspecified: Secondary | ICD-10-CM | POA: Diagnosis not present

## 2024-05-14 DIAGNOSIS — D509 Iron deficiency anemia, unspecified: Secondary | ICD-10-CM | POA: Diagnosis present

## 2024-05-14 DIAGNOSIS — N250 Renal osteodystrophy: Secondary | ICD-10-CM | POA: Diagnosis present

## 2024-05-14 DIAGNOSIS — N179 Acute kidney failure, unspecified: Principal | ICD-10-CM | POA: Diagnosis present

## 2024-05-14 DIAGNOSIS — I5033 Acute on chronic diastolic (congestive) heart failure: Secondary | ICD-10-CM | POA: Diagnosis present

## 2024-05-14 DIAGNOSIS — Z992 Dependence on renal dialysis: Secondary | ICD-10-CM

## 2024-05-14 DIAGNOSIS — E1122 Type 2 diabetes mellitus with diabetic chronic kidney disease: Secondary | ICD-10-CM | POA: Diagnosis present

## 2024-05-14 DIAGNOSIS — Z880 Allergy status to penicillin: Secondary | ICD-10-CM | POA: Diagnosis not present

## 2024-05-14 DIAGNOSIS — I161 Hypertensive emergency: Secondary | ICD-10-CM | POA: Diagnosis present

## 2024-05-14 DIAGNOSIS — D631 Anemia in chronic kidney disease: Secondary | ICD-10-CM | POA: Diagnosis present

## 2024-05-14 DIAGNOSIS — E872 Acidosis, unspecified: Secondary | ICD-10-CM | POA: Diagnosis present

## 2024-05-14 DIAGNOSIS — I132 Hypertensive heart and chronic kidney disease with heart failure and with stage 5 chronic kidney disease, or end stage renal disease: Secondary | ICD-10-CM | POA: Diagnosis present

## 2024-05-14 DIAGNOSIS — Z87891 Personal history of nicotine dependence: Secondary | ICD-10-CM

## 2024-05-14 DIAGNOSIS — R9431 Abnormal electrocardiogram [ECG] [EKG]: Secondary | ICD-10-CM | POA: Diagnosis not present

## 2024-05-14 DIAGNOSIS — Z8249 Family history of ischemic heart disease and other diseases of the circulatory system: Secondary | ICD-10-CM | POA: Diagnosis not present

## 2024-05-14 DIAGNOSIS — E1121 Type 2 diabetes mellitus with diabetic nephropathy: Secondary | ICD-10-CM | POA: Diagnosis present

## 2024-05-14 DIAGNOSIS — D649 Anemia, unspecified: Secondary | ICD-10-CM

## 2024-05-14 DIAGNOSIS — I509 Heart failure, unspecified: Secondary | ICD-10-CM | POA: Diagnosis not present

## 2024-05-14 DIAGNOSIS — N185 Chronic kidney disease, stage 5: Secondary | ICD-10-CM

## 2024-05-14 DIAGNOSIS — Z7984 Long term (current) use of oral hypoglycemic drugs: Secondary | ICD-10-CM | POA: Diagnosis not present

## 2024-05-14 DIAGNOSIS — Z79899 Other long term (current) drug therapy: Secondary | ICD-10-CM | POA: Diagnosis not present

## 2024-05-14 DIAGNOSIS — R42 Dizziness and giddiness: Secondary | ICD-10-CM | POA: Diagnosis present

## 2024-05-14 HISTORY — DX: Chronic diastolic (congestive) heart failure: I50.32

## 2024-05-14 LAB — TROPONIN T, HIGH SENSITIVITY
Troponin T High Sensitivity: 161 ng/L (ref <19)
Troponin T High Sensitivity: 138 ng/L (ref <19)

## 2024-05-14 LAB — COMPREHENSIVE METABOLIC PANEL WITH GFR
ALT: 23 U/L (ref 0–44)
AST: 21 U/L (ref 15–41)
Albumin: 4.1 g/dL (ref 3.5–5.0)
Alkaline Phosphatase: 74 U/L (ref 38–126)
Anion gap: 18 — ABNORMAL HIGH (ref 5–15)
BUN: 87 mg/dL — ABNORMAL HIGH (ref 6–20)
CO2: 21 mmol/L — ABNORMAL LOW (ref 22–32)
Calcium: 9 mg/dL (ref 8.9–10.3)
Chloride: 101 mmol/L (ref 98–111)
Creatinine, Ser: 11.9 mg/dL — ABNORMAL HIGH (ref 0.61–1.24)
GFR, Estimated: 5 mL/min — ABNORMAL LOW (ref >60)
Glucose, Bld: 105 mg/dL — ABNORMAL HIGH (ref 70–99)
Potassium: 3.6 mmol/L (ref 3.5–5.1)
Sodium: 140 mmol/L (ref 135–145)
Total Bilirubin: 0.6 mg/dL (ref 0.0–1.2)
Total Protein: 6.8 g/dL (ref 6.5–8.1)

## 2024-05-14 LAB — BASIC METABOLIC PANEL WITH GFR
Anion gap: 18 — ABNORMAL HIGH (ref 5–15)
BUN: 94 mg/dL — ABNORMAL HIGH (ref 6–20)
CO2: 19 mmol/L — ABNORMAL LOW (ref 22–32)
Calcium: 8.6 mg/dL — ABNORMAL LOW (ref 8.9–10.3)
Chloride: 102 mmol/L (ref 98–111)
Creatinine, Ser: 12 mg/dL — ABNORMAL HIGH (ref 0.61–1.24)
GFR, Estimated: 5 mL/min — ABNORMAL LOW (ref >60)
Glucose, Bld: 140 mg/dL — ABNORMAL HIGH (ref 70–99)
Potassium: 3.6 mmol/L (ref 3.5–5.1)
Sodium: 138 mmol/L (ref 135–145)

## 2024-05-14 LAB — CBC WITH DIFFERENTIAL/PLATELET
Abs Immature Granulocytes: 0.03 K/uL (ref 0.00–0.07)
Basophils Absolute: 0.1 K/uL (ref 0.0–0.1)
Basophils Relative: 1 %
Eosinophils Absolute: 1.1 K/uL — ABNORMAL HIGH (ref 0.0–0.5)
Eosinophils Relative: 11 %
HCT: 30.5 % — ABNORMAL LOW (ref 39.0–52.0)
Hemoglobin: 10 g/dL — ABNORMAL LOW (ref 13.0–17.0)
Immature Granulocytes: 0 %
Lymphocytes Relative: 15 %
Lymphs Abs: 1.5 K/uL (ref 0.7–4.0)
MCH: 27.1 pg (ref 26.0–34.0)
MCHC: 32.8 g/dL (ref 30.0–36.0)
MCV: 82.7 fL (ref 80.0–100.0)
Monocytes Absolute: 0.4 K/uL (ref 0.1–1.0)
Monocytes Relative: 3 %
Neutro Abs: 7.5 K/uL (ref 1.7–7.7)
Neutrophils Relative %: 70 %
Platelets: 298 K/uL (ref 150–400)
RBC: 3.69 MIL/uL — ABNORMAL LOW (ref 4.22–5.81)
RDW: 12.4 % (ref 11.5–15.5)
WBC: 10.5 K/uL (ref 4.0–10.5)
nRBC: 0 % (ref 0.0–0.2)

## 2024-05-14 LAB — MRSA NEXT GEN BY PCR, NASAL: MRSA by PCR Next Gen: NOT DETECTED

## 2024-05-14 LAB — PRO BRAIN NATRIURETIC PEPTIDE: Pro Brain Natriuretic Peptide: 30686 pg/mL — ABNORMAL HIGH (ref <300.0)

## 2024-05-14 LAB — MAGNESIUM: Magnesium: 2.3 mg/dL (ref 1.7–2.4)

## 2024-05-14 MED ORDER — ONDANSETRON HCL 4 MG/2ML IJ SOLN: 4.0000 mg | Freq: Four times a day (QID) | INTRAMUSCULAR | Status: DC | PRN

## 2024-05-14 MED ORDER — NITROGLYCERIN IN D5W 200-5 MCG/ML-% IV SOLN
0.0000 ug/min | INTRAVENOUS | Status: DC
  Administered 2024-05-14: 110 ug/min via INTRAVENOUS
  Administered 2024-05-14: 10 ug/min via INTRAVENOUS
  Administered 2024-05-14: 60 ug/min via INTRAVENOUS
  Administered 2024-05-14: 70 ug/min via INTRAVENOUS
  Administered 2024-05-14: 100 ug/min via INTRAVENOUS
  Administered 2024-05-14: 90 ug/min via INTRAVENOUS
  Administered 2024-05-15: 60 ug/min via INTRAVENOUS
  Filled 2024-05-14 (×2): qty 250

## 2024-05-14 MED ORDER — AMLODIPINE BESYLATE 5 MG PO TABS
5.0000 mg | ORAL_TABLET | Freq: Once | ORAL | Status: AC
  Administered 2024-05-14: 5 mg via ORAL
  Filled 2024-05-14: qty 1

## 2024-05-14 MED ORDER — LABETALOL HCL 5 MG/ML IV SOLN
20.0000 mg | Freq: Once | INTRAVENOUS | Status: AC
  Administered 2024-05-14: 20 mg via INTRAVENOUS
  Filled 2024-05-14: qty 4

## 2024-05-14 MED ORDER — MELATONIN 3 MG PO TABS: 3.0000 mg | ORAL_TABLET | Freq: Every evening | ORAL | Status: DC | PRN

## 2024-05-14 MED ORDER — FUROSEMIDE 10 MG/ML IJ SOLN
80.0000 mg | Freq: Once | INTRAMUSCULAR | Status: AC
  Administered 2024-05-14: 80 mg via INTRAVENOUS
  Filled 2024-05-14: qty 8

## 2024-05-14 MED ORDER — LABETALOL HCL 5 MG/ML IV SOLN: 10.0000 mg | Freq: Once | INTRAVENOUS | Status: DC

## 2024-05-14 MED ORDER — ACETAMINOPHEN 325 MG PO TABS
650.0000 mg | ORAL_TABLET | Freq: Four times a day (QID) | ORAL | Status: DC | PRN
  Administered 2024-05-14 – 2024-05-15 (×2): 650 mg via ORAL
  Filled 2024-05-14 (×2): qty 2

## 2024-05-14 MED ORDER — ACETAMINOPHEN 650 MG RE SUPP
650.0000 mg | Freq: Four times a day (QID) | RECTAL | Status: DC | PRN
Start: 2024-05-14 — End: 2024-05-18

## 2024-05-14 MED ORDER — FUROSEMIDE 10 MG/ML IJ SOLN
80.0000 mg | Freq: Two times a day (BID) | INTRAMUSCULAR | Status: DC
  Administered 2024-05-14 – 2024-05-18 (×7): 80 mg via INTRAVENOUS
  Filled 2024-05-14 (×7): qty 8

## 2024-05-14 NOTE — ED Provider Notes (Signed)
  Physical Exam  BP (!) 215/128   Pulse (!) 104   Temp 98.6 F (37 C)   Resp (!) 21   Ht 6' 1 (1.854 m)   Wt 81.6 kg   SpO2 93%   BMI 23.75 kg/m   Physical Exam Vitals and nursing note reviewed.  Constitutional:      General: He is not in acute distress.    Appearance: He is well-developed.  HENT:     Head: Normocephalic and atraumatic.  Eyes:     Conjunctiva/sclera: Conjunctivae normal.  Cardiovascular:     Rate and Rhythm: Normal rate and regular rhythm.     Heart sounds: No murmur heard. Pulmonary:     Effort: Pulmonary effort is normal. No respiratory distress.     Breath sounds: Normal breath sounds.  Abdominal:     Palpations: Abdomen is soft.     Tenderness: There is no abdominal tenderness.  Musculoskeletal:        General: No swelling.     Cervical back: Neck supple.  Skin:    General: Skin is warm and dry.     Capillary Refill: Capillary refill takes less than 2 seconds.  Neurological:     Mental Status: He is alert.  Psychiatric:        Mood and Affect: Mood normal.     Procedures  Procedures  ED Course / MDM    Medical Decision Making Amount and/or Complexity of Data Reviewed Labs: ordered. Radiology: ordered.  Risk Prescription drug management. Decision regarding hospitalization.    Patient presents because of shortness of breath.  Increasing shortness of breath over the past couple weeks.  Patient has a history of cardiomyopathy secondary to uncontrolled hypertension.  Has been off all of his meds for the past 6 months.  His get swelling in bilateral legs.  No chest pain.  Creatinine found to be 11.9.  Baseline around 3-4.  Nephrology was consulted.  Recommended Lasix  one-time 80 mg to try for aggressive diuresis.  He is also on a nitroglycerin  drip to try and lower his blood pressure.  Will continue to monitor blood pressure make sure it does not drop further than 20% at the initial presentation.  Pending admission.   She has been in the  emergency room all day.  At 1 point, blood pressure did drop to 150.  Patient is asymptomatic.  Turned off the nitroglycerin  and systolic dropped back up to 190.  I think plan will be for blood pressure goals between 160-190 for the rest of the day so that we do not overdo it too fast.  Remains neurologically intact.  Will repeat BMP.          Simon Lavonia SAILOR, MD 05/14/24 [phone]

## 2024-05-14 NOTE — ED Notes (Signed)
 Per verbal order from Dr. Simon, decrease nitroglycerin  rate by half at this time as pt's most recent bp 158/98. Nitroglycerin  adjusted from 95 mcg to 50 mcg.

## 2024-05-14 NOTE — ED Triage Notes (Signed)
 Pt reports SHOB for the last two weeks that has gotten worse tonight. Pt reports he has not been taking his BP meds for approx 6 months. Pt denies any pain.

## 2024-05-14 NOTE — ED Provider Notes (Signed)
 [facility] Provider Note   CSN: [id number] Arrival date & time: 05/14/24  0533     Patient presents with: Shortness of Breath   Bobby Stevens is a 53 y.o. male.   Patient presents to the emergency department for evaluation of shortness of breath.  Patient reports that he has noticed increasing shortness of breath over the last 2 weeks.  He does report that his feet are swelling.  He has a history of hypertension, has been off of his blood pressure meds for about 6 months.  He reports that they were making him sick, he stopped taking them.  He denies chest pain.       Prior to Admission medications   Medication Sig Start Date End Date Taking? Authorizing Provider  amLODipine  (NORVASC ) 10 MG tablet Take 1 tablet (10 mg total) by mouth daily. 10/30/23   Prosperi, Christian H, PA-C  carvedilol  (COREG ) 25 MG tablet Take 1 tablet (25 mg total) by mouth 2 (two) times daily. 10/30/23   Prosperi, Christian H, PA-C  chlorthalidone  (HYGROTON ) 25 MG tablet Take 0.5 tablets (12.5 mg total) by mouth daily. 10/30/23 10/29/24  Prosperi, Christian H, PA-C  COVID-19 mRNA vaccine [phone] (COMIRNATY ) syringe Inject into the muscle. 10/27/22   Luiz Channel, MD  Difluprednate 0.05 % EMUL Place 1 drop into the left eye every 4 hours. 02/04/21   [provider]  doxazosin  (CARDURA ) 4 MG tablet Take 1 tablet (4 mg total) by mouth 2 (two) times daily. 06/22/22 06/22/23  Lonni Slain, MD  doxycycline  (VIBRAMYCIN ) 100 MG capsule Take 1 capsule (100 mg total) by mouth 2 (two) times daily. 10/30/23   Prosperi, Christian H, PA-C  hydrALAZINE  (APRESOLINE ) 100 MG tablet Take 1 tablet (100 mg total) by mouth 3 (three) times daily. 10/30/23   Prosperi, Christian H, PA-C  isosorbide  mononitrate (IMDUR ) 30 MG 24 hr tablet Take 1 tablet (30 mg total) by mouth daily. 10/30/23   Prosperi, Christian H, PA-C  metFORMIN  (GLUCOPHAGE ) 500 MG tablet Take 1 tablet (500  mg total) by mouth daily. 02/09/23   Dennise Hoes, MD  telmisartan  (MICARDIS ) 20 MG tablet Take 1 tablet (20 mg total) by mouth daily. 10/30/23   Prosperi, Christian H, PA-C  vitamin B-12 (CYANOCOBALAMIN ) 250 MCG tablet Take 250 mcg by mouth daily.    [provider]    Allergies: Penicillins    Review of Systems  Updated Vital Signs BP (!) 234/148   Pulse (!) 109   Temp 98.6 F (37 C)   Resp 20   Ht 6' 1 (1.854 m)   Wt 81.6 kg   SpO2 97%   BMI 23.75 kg/m   Physical Exam Vitals and nursing note reviewed.  Constitutional:      General: He is not in acute distress.    Appearance: He is well-developed.  HENT:     Head: Normocephalic and atraumatic.     Mouth/Throat:     Mouth: Mucous membranes are moist.  Eyes:     General: Vision grossly intact. Gaze aligned appropriately.     Extraocular Movements: Extraocular movements intact.     Conjunctiva/sclera: Conjunctivae normal.  Cardiovascular:     Rate and Rhythm: Regular rhythm. Tachycardia present.     Pulses: Normal pulses.     Heart sounds: Normal heart sounds, S1 normal and S2 normal. No murmur heard.    No friction rub. No gallop.  Pulmonary:     Effort: Pulmonary effort is  normal. No respiratory distress.     Breath sounds: Normal breath sounds.  Abdominal:     Palpations: Abdomen is soft.     Tenderness: There is no abdominal tenderness. There is no guarding or rebound.     Hernia: No hernia is present.  Musculoskeletal:        General: No swelling.     Cervical back: Full passive range of motion without pain, normal range of motion and neck supple. No pain with movement, spinous process tenderness or muscular tenderness. Normal range of motion.     Right lower leg: Edema present.     Left lower leg: Edema present.  Skin:    General: Skin is warm and dry.     Capillary Refill: Capillary refill takes less than 2 seconds.     Findings: No ecchymosis, erythema, lesion or wound.  Neurological:     Mental  Status: He is alert and oriented to person, place, and time.     GCS: GCS eye subscore is 4. GCS verbal subscore is 5. GCS motor subscore is 6.     Cranial Nerves: Cranial nerves 2-12 are intact.     Sensory: Sensation is intact.     Motor: Motor function is intact. No weakness or abnormal muscle tone.     Coordination: Coordination is intact.  Psychiatric:        Mood and Affect: Mood normal.        Speech: Speech normal.        Behavior: Behavior normal.     (all labs ordered are listed, but only abnormal results are displayed) Labs Reviewed  CBC WITH DIFFERENTIAL/PLATELET - Abnormal; Notable for the following components:      Result Value   RBC 3.69 (*)    Hemoglobin 10.0 (*)    HCT 30.5 (*)    Eosinophils Absolute 1.1 (*)    All other components within normal limits  COMPREHENSIVE METABOLIC PANEL WITH GFR - Abnormal; Notable for the following components:   CO2 21 (*)    Glucose, Bld 105 (*)    BUN 87 (*)    Creatinine, Ser 11.90 (*)    GFR, Estimated 5 (*)    Anion gap 18 (*)    All other components within normal limits  PRO BRAIN NATRIURETIC PEPTIDE - Abnormal; Notable for the following components:   Pro Brain Natriuretic Peptide 30,686.0 (*)    All other components within normal limits  TROPONIN T, HIGH SENSITIVITY - Abnormal; Notable for the following components:   Troponin T High Sensitivity 161 (*)    All other components within normal limits  MAGNESIUM    EKG: None  Radiology: [facility] Chest Port 1 View Result Date: 05/14/2024 EXAM: 1 VIEW XRAY OF THE CHEST 05/14/2024 06:01:04 AM COMPARISON: 10/30/2023 CLINICAL HISTORY: Shortness of breath. SOB x 2 weeks, worse tonight. Pt reports non-compliance with blood pressure meds x 6 months. Hx diabetes and HTN. FINDINGS: LUNGS AND PLEURA: No focal pulmonary opacity. No pulmonary edema. No pleural effusion. No pneumothorax. Mild diffuse increase in interstitial markings. HEART AND MEDIASTINUM: Stable mild cardiac enlargement.  BONES AND SOFT TISSUES: No acute osseous abnormality. Osseous structures appear intact. IMPRESSION: 1. Stable mild cardiac enlargement with mild diffuse increase in interstitial markings. Increased interstitial markings may be seen with pulmonary edema or atypical infection 2. No pleural effusion or consolidative change. Electronically signed by: Waddell Calk MD 05/14/2024 06:13 AM EDT RP Workstation: [id number]     Procedures   Medications Ordered in  the ED  nitroGLYCERIN  50 mg in dextrose  5 % 250 mL (0.2 mg/mL) infusion (30 mcg/min Intravenous Rate/Dose Change 05/14/24 0654)  furosemide  (LASIX ) injection 80 mg (80 mg Intravenous Given 05/14/24 0647)                                    Medical Decision Making Amount and/or Complexity of Data Reviewed External Data Reviewed: labs, radiology, ECG and notes. Labs: ordered. Decision-making details documented in ED Course. Radiology: ordered and independent interpretation performed. Decision-making details documented in ED Course. ECG/medicine tests: ordered and independent interpretation performed. Decision-making details documented in ED Course.  Risk Prescription drug management.   Differential Diagnosis considered includes, but not limited to: CHF exacerbation; ACS/MI; Bronchitis/URI; Pneumonia; PE  Patient presents to the emergency department with progressively worsening shortness of breath.  Patient does have a history of poorly controlled hypertension.  Reviewing records reveals a history of nonischemic cardiomyopathy secondary to his uncontrolled hypertension.  He also has a history of chronic renal insufficiency.  Patient markedly hypertensive at arrival.  Examination reveals bilateral lower extremity edema sequela of congestive heart failure.  Chest x-ray does show mild to moderate volume overload.  Patient started on nitroglycerin  for blood pressure control as well as decreasing preload to help with congestive heart  failure.  Patient's lab work reveals marked acute kidney injury with creatinine of 11.9.  Baseline appears to be in the fours.  Discussed briefly with Dr. Melia, on-call for nephrology.  Agrees with initiating diuresis here in the ED, can consult on the patient when he gets to [facility] for admission.  Patient's EKG does not show obvious ischemia.  First troponin is 160 which I believed to be secondary to hypertensive urgency and leak.  Patient not experiencing any chest pain currently.  CRITICAL CARE Performed by: Lonni JINNY Seats   Total critical care time: 30 minutes  Critical care time was exclusive of separately billable procedures and treating other patients.  Critical care was necessary to treat or prevent imminent or life-threatening deterioration.  Critical care was time spent personally by me on the following activities: development of treatment plan with patient and/or surrogate as well as nursing, discussions with consultants, evaluation of patient's response to treatment, examination of patient, obtaining history from patient or surrogate, ordering and performing treatments and interventions, ordering and review of laboratory studies, ordering and review of radiographic studies, pulse oximetry and re-evaluation of patient's condition.      Final diagnoses:  Hypertensive emergency    ED Discharge Orders     None          Seats Lonni JINNY, MD 05/14/24 [phone]

## 2024-05-14 NOTE — ED Notes (Addendum)
 ED TO INPATIENT HANDOFF REPORT  ED Nurse Name and Phone #: Jakel Alphin [phone]  S Name/Age/Gender Bobby Stevens 53 y.o. male Room/Bed: MH03/MH03  Code Status   Code Status: Prior  Home/SNF/Other Home Patient oriented to: self, place, time, and situation Is this baseline? Yes   Triage Complete: Triage complete  Chief Complaint ARF (acute renal failure) (HCC) [N17.9]  Triage Note Pt reports SHOB for the last two weeks that has gotten worse tonight. Pt reports he has not been taking his BP meds for approx 6 months. Pt denies any pain.    Allergies Allergies  Allergen Reactions   Penicillins Rash    Level of Care/Admitting Diagnosis ED Disposition     ED Disposition  Admit   Condition  --   Comment  Hospital Area: [facility] [[id number]]  Level of Care: Progressive [102]  Admit to Progressive based on following criteria: NEPHROLOGY stable condition requiring close monitoring for AKI, requiring Hemodialysis or Peritoneal Dialysis either from expected electrolyte imbalance, acidosis, or fluid overload that can be managed by NIPPV or high flow oxygen.  Admit to Progressive based on following criteria: MULTISYSTEM THREATS such as stable sepsis, metabolic/electrolyte imbalance with or without encephalopathy that is responding to early treatment.  Admit to Progressive based on following criteria: CARDIOVASCULAR & THORACIC of moderate stability with acute coronary syndrome symptoms/low risk myocardial infarction/hypertensive urgency/arrhythmias/heart failure potentially compromising stability and stable post cardiovascular intervention patients.  May admit patient to [facility] or [facility] if equivalent level of care is available:: No  Interfacility transfer: Yes  Covid Evaluation: Asymptomatic - no recent exposure (last 10 days) testing not required  Diagnosis: ARF (acute renal failure) [facility]) [[id number]]  Admitting Physician: CINDY GARNETTE POUR [[id number]]   Attending Physician: HAZE LONNI PARAS [[id number]]  Certification:: I certify this patient will need inpatient services for at least 2 midnights  Expected Medical Readiness: 05/20/2024          B Medical/Surgery History Past Medical History:  Diagnosis Date   Diabetes mellitus type 2 with complications (HCC)    Hypertension 2020   Kidney disease    Resistant hypertension 02/10/2021   Past Surgical History:  Procedure Laterality Date   NO PAST SURGERIES     None       A IV Location/Drains/Wounds Patient Lines/Drains/Airways Status     Active Line/Drains/Airways     Name Placement date Placement time Site Days   Peripheral IV 05/14/24 20 G Right Antecubital 05/14/24  0548  Antecubital  less than 1   Peripheral IV 05/14/24 20 G Left;Upper Arm 05/14/24  0645  Arm  less than 1            Intake/Output Last 24 hours  Intake/Output Summary (Last 24 hours) at 05/14/2024 1943 Last data filed at 05/14/2024 1212 Gross per 24 hour  Intake 132.07 ml  Output 1335 ml  Net -1202.93 ml    Labs/Imaging Results for orders placed or performed during the hospital encounter of 05/14/24 (from the past 48 hours)  CBC with Differential/Platelet     Status: Abnormal   Collection Time: 05/14/24  5:48 AM  Result Value Ref Range   WBC 10.5 4.0 - 10.5 K/uL   RBC 3.69 (L) 4.22 - 5.81 MIL/uL   Hemoglobin 10.0 (L) 13.0 - 17.0 g/dL   HCT 69.4 (L) 60.9 - 47.9 %   MCV 82.7 80.0 - 100.0 fL   MCH 27.1 26.0 - 34.0 pg   MCHC 32.8 30.0 - 36.0  g/dL   RDW 87.5 88.4 - 84.4 %   Platelets 298 150 - 400 K/uL   nRBC 0.0 0.0 - 0.2 %   Neutrophils Relative % 70 %   Neutro Abs 7.5 1.7 - 7.7 K/uL   Lymphocytes Relative 15 %   Lymphs Abs 1.5 0.7 - 4.0 K/uL   Monocytes Relative 3 %   Monocytes Absolute 0.4 0.1 - 1.0 K/uL   Eosinophils Relative 11 %   Eosinophils Absolute 1.1 (H) 0.0 - 0.5 K/uL   Basophils Relative 1 %   Basophils Absolute 0.1 0.0 - 0.1 K/uL   Immature Granulocytes 0 %   Abs  Immature Granulocytes 0.03 0.00 - 0.07 K/uL    Comment: Performed at [facility], [address] [place], [place] [address]  Comprehensive metabolic panel with GFR     Status: Abnormal   Collection Time: 05/14/24  5:48 AM  Result Value Ref Range   Sodium 140 135 - 145 mmol/L   Potassium 3.6 3.5 - 5.1 mmol/L   Chloride 101 98 - 111 mmol/L   CO2 21 (L) 22 - 32 mmol/L   Glucose, Bld 105 (H) 70 - 99 mg/dL    Comment: Glucose reference range applies only to samples taken after fasting for at least 8 hours.   BUN 87 (H) 6 - 20 mg/dL   Creatinine, Ser 88.09 (H) 0.61 - 1.24 mg/dL   Calcium 9.0 8.9 - 89.6 mg/dL   Total Protein 6.8 6.5 - 8.1 g/dL   Albumin 4.1 3.5 - 5.0 g/dL   AST 21 15 - 41 U/L   ALT 23 0 - 44 U/L   Alkaline Phosphatase 74 38 - 126 U/L   Total Bilirubin 0.6 0.0 - 1.2 mg/dL   GFR, Estimated 5 (L) >60 mL/min    Comment: (NOTE) Calculated using the CKD-EPI Creatinine Equation (2021)    Anion gap 18 (H) 5 - 15    Comment: Performed at [facility], [address] [place], [place] [address]  Troponin T, High Sensitivity     Status: Abnormal   Collection Time: 05/14/24  5:48 AM  Result Value Ref Range   Troponin T High Sensitivity 161 (HH) <19 ng/L    Comment: Critical Value, Read Back and verified with RONAL ARMOND OAS RN [phone] 05/14/2024 T. TYSOR (NOTE) Biotin concentrations > 1000 ng/mL falsely decrease TnT results.  Serial cardiac troponin measurements are suggested.  Refer to the Links section for chest pain algorithms and additional  guidance. Performed at [facility], [address] [place], [place] [address]   Pro Brain natriuretic peptide     Status: Abnormal   Collection Time: 05/14/24  5:48 AM  Result Value Ref Range   Pro Brain Natriuretic Peptide 30,686.0 (H) <300.0 pg/mL    Comment: (NOTE) Age Group        Cut-Points    Interpretation  < 50 years     450 pg/mL       NT-proBNP > 450 pg/mL indicates                                 ADHF is likely              50 to 75 years  900 pg/mL      NT-proBNP > 900 pg/mL indicates          ADHF is likely  >  75 years      1800 pg/mL     NT-proBNP > 1800 pg/mL indicates          ADHF is likely                           All ages    Results between       Indeterminate. Further clinical             300 and the cut-   information is needed to determine            point for age group   if ADHF is present.                                                             Elecsys proBNP II/ Elecsys proBNP II STAT           Cut-Point                       Interpretation  300 pg/mL                    NT-proBNP <300pg/mL indicates                             ADHF is not likely  Performed at [facility], [address] [place], [place] [address]   Magnesium     Status: None   Collection Time: 05/14/24  5:48 AM  Result Value Ref Range   Magnesium 2.3 1.7 - 2.4 mg/dL    Comment: Performed at [facility], [address] [place], [place] [address]  Troponin T, High Sensitivity     Status: Abnormal   Collection Time: 05/14/24  7:39 AM  Result Value Ref Range   Troponin T High Sensitivity 138 (HH) <19 ng/L    Comment: Critical Value, Read Back and verified with R. CLEMENT RN AT 827 05/14/24 MB (NOTE) Biotin concentrations > 1000 ng/mL falsely decrease TnT results.  Serial cardiac troponin measurements are suggested.  Refer to the Links section for chest pain algorithms and additional  guidance. Performed at [facility], [address] [place], [place] [address]   Basic metabolic panel     Status: Abnormal   Collection Time: 05/14/24  2:19 PM  Result Value Ref Range   Sodium 138 135 - 145 mmol/L   Potassium 3.6 3.5 - 5.1 mmol/L   Chloride 102 98 - 111 mmol/L   CO2 19 (L) 22 - 32 mmol/L   Glucose, Bld 140 (H) 70 - 99 mg/dL    Comment: Glucose reference range applies only to samples taken after fasting for at least 8 hours.   BUN  94 (H) 6 - 20 mg/dL   Creatinine, Ser 87.99 (H) 0.61 - 1.24 mg/dL   Calcium 8.6 (L) 8.9 - 10.3 mg/dL   GFR, Estimated 5 (L) >60 mL/min    Comment: (NOTE) Calculated using the CKD-EPI Creatinine Equation (2021)    Anion gap 18 (H) 5 - 15    Comment: Performed at [facility], [address] [place], [place] [address]   DG  Chest Port 1 View Result Date: 05/14/2024 EXAM: 1 VIEW XRAY OF THE CHEST 05/14/2024 06:01:04 AM COMPARISON: 10/30/2023 CLINICAL HISTORY: Shortness of breath. SOB x 2 weeks, worse tonight. Pt reports non-compliance with blood pressure meds x 6 months. Hx diabetes and HTN. FINDINGS: LUNGS AND PLEURA: No focal pulmonary opacity. No pulmonary edema. No pleural effusion. No pneumothorax. Mild diffuse increase in interstitial markings. HEART AND MEDIASTINUM: Stable mild cardiac enlargement. BONES AND SOFT TISSUES: No acute osseous abnormality. Osseous structures appear intact. IMPRESSION: 1. Stable mild cardiac enlargement with mild diffuse increase in interstitial markings. Increased interstitial markings may be seen with pulmonary edema or atypical infection 2. No pleural effusion or consolidative change. Electronically signed by: Waddell Calk MD 05/14/2024 06:13 AM EDT RP Workstation: [id number]    Pending Labs Unresulted Labs (From admission, onward)    None       Vitals/Pain Today's Vitals   05/14/24 1815 05/14/24 1830 05/14/24 1845 05/14/24 1900  BP: (!) 177/134 (!) 175/128 (!) 179/129 (!) 182/121  Pulse: 87 89 91 89  Resp: 18 (!) 28 (!) 25 (!) 30  Temp: 98.1 F (36.7 C)     TempSrc: Oral     SpO2: 97% 96% 93% 100%  Weight:      Height:      PainSc:        Isolation Precautions No active isolations  Medications Medications  nitroGLYCERIN  50 mg in dextrose  5 % 250 mL (0.2 mg/mL) infusion (0 mcg/min Intravenous Stopped 05/14/24 1138)  furosemide  (LASIX ) injection 80 mg (80 mg Intravenous Given 05/14/24 0647)  labetalol  (NORMODYNE ) injection 20 mg  (20 mg Intravenous Given 05/14/24 1616)  amLODipine  (NORVASC ) tablet 5 mg (5 mg Oral Given 05/14/24 1820)    Mobility walks     Focused Assessments Cardiac Assessment Handoff:  Cardiac Rhythm: Normal sinus rhythm Lab Results  Component Value Date   CKTOTAL 128 05/10/2020   Lab Results  Component Value Date   DDIMER 0.87 (H) 05/09/2020   Does the Patient currently have chest pain? No    R Recommendations: See Admitting Provider Note  Report given to: Du[place], RN  Additional Notes:

## 2024-05-14 NOTE — ED Provider Notes (Signed)
 Patient currently off nitroglycerin  drip.  Blood pressure is unfortunately started to become elevated and is now 205/130.  Ordered him for labetalol .   Bobby Lamar BROCKS, MD 05/14/24 [phone]

## 2024-05-15 ENCOUNTER — Encounter (HOSPITAL_COMMUNITY): Payer: Self-pay | Admitting: Internal Medicine

## 2024-05-15 ENCOUNTER — Inpatient Hospital Stay (HOSPITAL_COMMUNITY)

## 2024-05-15 DIAGNOSIS — E119 Type 2 diabetes mellitus without complications: Secondary | ICD-10-CM

## 2024-05-15 DIAGNOSIS — N186 End stage renal disease: Secondary | ICD-10-CM

## 2024-05-15 DIAGNOSIS — I509 Heart failure, unspecified: Secondary | ICD-10-CM

## 2024-05-15 DIAGNOSIS — I10 Essential (primary) hypertension: Secondary | ICD-10-CM

## 2024-05-15 DIAGNOSIS — R7989 Other specified abnormal findings of blood chemistry: Secondary | ICD-10-CM | POA: Insufficient documentation

## 2024-05-15 DIAGNOSIS — N185 Chronic kidney disease, stage 5: Secondary | ICD-10-CM | POA: Diagnosis not present

## 2024-05-15 DIAGNOSIS — E1122 Type 2 diabetes mellitus with diabetic chronic kidney disease: Secondary | ICD-10-CM

## 2024-05-15 DIAGNOSIS — Z7984 Long term (current) use of oral hypoglycemic drugs: Secondary | ICD-10-CM

## 2024-05-15 DIAGNOSIS — I5033 Acute on chronic diastolic (congestive) heart failure: Secondary | ICD-10-CM | POA: Diagnosis not present

## 2024-05-15 DIAGNOSIS — R9431 Abnormal electrocardiogram [ECG] [EKG]: Secondary | ICD-10-CM | POA: Insufficient documentation

## 2024-05-15 LAB — CBC WITH DIFFERENTIAL/PLATELET
Abs Immature Granulocytes: 0.03 K/uL (ref 0.00–0.07)
Basophils Absolute: 0 K/uL (ref 0.0–0.1)
Basophils Relative: 0 %
Eosinophils Absolute: 1.2 K/uL — ABNORMAL HIGH (ref 0.0–0.5)
Eosinophils Relative: 12 %
HCT: 27 % — ABNORMAL LOW (ref 39.0–52.0)
Hemoglobin: 8.7 g/dL — ABNORMAL LOW (ref 13.0–17.0)
Immature Granulocytes: 0 %
Lymphocytes Relative: 12 %
Lymphs Abs: 1.2 K/uL (ref 0.7–4.0)
MCH: 26.9 pg (ref 26.0–34.0)
MCHC: 32.2 g/dL (ref 30.0–36.0)
MCV: 83.6 fL (ref 80.0–100.0)
Monocytes Absolute: 0.4 K/uL (ref 0.1–1.0)
Monocytes Relative: 4 %
Neutro Abs: 7.1 K/uL (ref 1.7–7.7)
Neutrophils Relative %: 72 %
Platelets: 254 K/uL (ref 150–400)
RBC: 3.23 MIL/uL — ABNORMAL LOW (ref 4.22–5.81)
RDW: 12.4 % (ref 11.5–15.5)
WBC: 10 K/uL (ref 4.0–10.5)
nRBC: 0 % (ref 0.0–0.2)

## 2024-05-15 LAB — MAGNESIUM: Magnesium: 2.2 mg/dL (ref 1.7–2.4)

## 2024-05-15 LAB — TYPE AND SCREEN
ABO/RH(D): O POS
Antibody Screen: NEGATIVE

## 2024-05-15 LAB — ECHOCARDIOGRAM COMPLETE
Area-P 1/2: 7.66 cm2
Calc EF: 43.2 %
Height: 73 in
MV M vel: 5.64 m/s
MV Peak grad: 127.2 mmHg
Radius: 0.4 cm
S' Lateral: 4.7 cm
Single Plane A2C EF: 42.6 %
Single Plane A4C EF: 41.8 %
Weight: 2772.5 [oz_av]

## 2024-05-15 LAB — URINALYSIS, COMPLETE (UACMP) WITH MICROSCOPIC
Bacteria, UA: NONE SEEN
Bilirubin Urine: NEGATIVE
Glucose, UA: 50 mg/dL — AB
Ketones, ur: NEGATIVE mg/dL
Leukocytes,Ua: NEGATIVE
Nitrite: NEGATIVE
Protein, ur: 100 mg/dL — AB
Specific Gravity, Urine: 1.008 (ref 1.005–1.030)
pH: 6 (ref 5.0–8.0)

## 2024-05-15 LAB — COMPREHENSIVE METABOLIC PANEL WITH GFR
ALT: 18 U/L (ref 0–44)
AST: 16 U/L (ref 15–41)
Albumin: 2.9 g/dL — ABNORMAL LOW (ref 3.5–5.0)
Alkaline Phosphatase: 52 U/L (ref 38–126)
Anion gap: 13 (ref 5–15)
BUN: 95 mg/dL — ABNORMAL HIGH (ref 6–20)
CO2: 20 mmol/L — ABNORMAL LOW (ref 22–32)
Calcium: 8.3 mg/dL — ABNORMAL LOW (ref 8.9–10.3)
Chloride: 103 mmol/L (ref 98–111)
Creatinine, Ser: 12.07 mg/dL — ABNORMAL HIGH (ref 0.61–1.24)
GFR, Estimated: 5 mL/min — ABNORMAL LOW (ref >60)
Glucose, Bld: 149 mg/dL — ABNORMAL HIGH (ref 70–99)
Potassium: 3.9 mmol/L (ref 3.5–5.1)
Sodium: 136 mmol/L (ref 135–145)
Total Bilirubin: 0.5 mg/dL (ref 0.0–1.2)
Total Protein: 5.7 g/dL — ABNORMAL LOW (ref 6.5–8.1)

## 2024-05-15 LAB — IRON AND TIBC
Iron: 31 ug/dL — ABNORMAL LOW (ref 45–182)
Saturation Ratios: 9 % — ABNORMAL LOW (ref 17.9–39.5)
TIBC: 340 ug/dL (ref 250–450)
UIBC: 309 ug/dL

## 2024-05-15 LAB — APTT: aPTT: 33 s (ref 24–36)

## 2024-05-15 LAB — RENAL FUNCTION PANEL
Albumin: 2.8 g/dL — ABNORMAL LOW (ref 3.5–5.0)
Anion gap: 13 (ref 5–15)
BUN: 95 mg/dL — ABNORMAL HIGH (ref 6–20)
CO2: 20 mmol/L — ABNORMAL LOW (ref 22–32)
Calcium: 8.2 mg/dL — ABNORMAL LOW (ref 8.9–10.3)
Chloride: 104 mmol/L (ref 98–111)
Creatinine, Ser: 12.49 mg/dL — ABNORMAL HIGH (ref 0.61–1.24)
GFR, Estimated: 4 mL/min — ABNORMAL LOW (ref >60)
Glucose, Bld: 119 mg/dL — ABNORMAL HIGH (ref 70–99)
Phosphorus: 7.9 mg/dL — ABNORMAL HIGH (ref 2.5–4.6)
Potassium: 3.8 mmol/L (ref 3.5–5.1)
Sodium: 137 mmol/L (ref 135–145)

## 2024-05-15 LAB — RAPID URINE DRUG SCREEN, HOSP PERFORMED
Amphetamines: NOT DETECTED
Barbiturates: NOT DETECTED
Benzodiazepines: NOT DETECTED
Cocaine: NOT DETECTED
Opiates: NOT DETECTED
Tetrahydrocannabinol: NOT DETECTED

## 2024-05-15 LAB — ABO/RH: ABO/RH(D): O POS

## 2024-05-15 LAB — GLUCOSE, CAPILLARY
Glucose-Capillary: 89 mg/dL (ref 70–99)
Glucose-Capillary: 154 mg/dL — ABNORMAL HIGH (ref 70–99)
Glucose-Capillary: 119 mg/dL — ABNORMAL HIGH (ref 70–99)
Glucose-Capillary: 134 mg/dL — ABNORMAL HIGH (ref 70–99)

## 2024-05-15 LAB — HEMOGLOBIN A1C
Hgb A1c MFr Bld: 4.3 % — ABNORMAL LOW (ref 4.8–5.6)
Mean Plasma Glucose: 76.71 mg/dL

## 2024-05-15 LAB — PHOSPHORUS
Phosphorus: 7.9 mg/dL — ABNORMAL HIGH (ref 2.5–4.6)
Phosphorus: 7.9 mg/dL — ABNORMAL HIGH (ref 2.5–4.6)

## 2024-05-15 LAB — PROTIME-INR
INR: 1.3 — ABNORMAL HIGH (ref 0.8–1.2)
Prothrombin Time: 16.5 s — ABNORMAL HIGH (ref 11.4–15.2)

## 2024-05-15 LAB — BRAIN NATRIURETIC PEPTIDE: B Natriuretic Peptide: 1373.1 pg/mL — ABNORMAL HIGH (ref 0.0–100.0)

## 2024-05-15 LAB — FERRITIN: Ferritin: 145 ng/mL (ref 24–336)

## 2024-05-15 LAB — SODIUM, URINE, RANDOM: Sodium, Ur: 96 mmol/L

## 2024-05-15 LAB — HEPATITIS B SURFACE ANTIGEN: Hepatitis B Surface Ag: NONREACTIVE

## 2024-05-15 LAB — CREATININE, URINE, RANDOM: Creatinine, Urine: 50 mg/dL

## 2024-05-15 LAB — VITAMIN D 25 HYDROXY (VIT D DEFICIENCY, FRACTURES): Vit D, 25-Hydroxy: 12.42 ng/mL — ABNORMAL LOW (ref 30–100)

## 2024-05-15 MED ORDER — AMLODIPINE BESYLATE 10 MG PO TABS
10.0000 mg | ORAL_TABLET | Freq: Every day | ORAL | Status: DC
  Administered 2024-05-15 – 2024-05-18 (×4): 10 mg via ORAL
  Filled 2024-05-15 (×4): qty 1

## 2024-05-15 MED ORDER — IRON SUCROSE 200 MG IVPB - SIMPLE MED
200.0000 mg | Status: DC
  Administered 2024-05-15 – 2024-05-18 (×3): 200 mg via INTRAVENOUS
  Filled 2024-05-15 (×2): qty 200
  Filled 2024-05-15: qty 110
  Filled 2024-05-15: qty 10

## 2024-05-15 MED ORDER — LORAZEPAM 2 MG/ML IJ SOLN: 0.5000 mg | Freq: Four times a day (QID) | INTRAMUSCULAR | Status: DC | PRN

## 2024-05-15 MED ORDER — VANCOMYCIN HCL IN DEXTROSE 1-5 GM/200ML-% IV SOLN: 1000.0000 mg | INTRAVENOUS | Status: AC

## 2024-05-15 MED ORDER — HYDRALAZINE HCL 50 MG PO TABS
50.0000 mg | ORAL_TABLET | Freq: Three times a day (TID) | ORAL | Status: DC
  Administered 2024-05-15 – 2024-05-18 (×7): 50 mg via ORAL
  Filled 2024-05-15 (×7): qty 1

## 2024-05-15 MED ORDER — INSULIN ASPART 100 UNIT/ML IJ SOLN
0.0000 [IU] | Freq: Three times a day (TID) | INTRAMUSCULAR | Status: DC
  Administered 2024-05-15: 1 [IU] via SUBCUTANEOUS
  Administered 2024-05-16: 3 [IU] via SUBCUTANEOUS
  Administered 2024-05-17: 1 [IU] via SUBCUTANEOUS

## 2024-05-15 MED ORDER — CHLORHEXIDINE GLUCONATE CLOTH 2 % EX PADS
6.0000 | MEDICATED_PAD | Freq: Every day | CUTANEOUS | Status: DC
  Administered 2024-05-16 – 2024-05-18 (×3): 6 via TOPICAL

## 2024-05-15 NOTE — Progress Notes (Signed)
 Vein mapping  has been completed. Refer to [facility] under chart review to view preliminary results.   05/15/2024  4:32 PM Yecenia Dalgleish D

## 2024-05-15 NOTE — Assessment & Plan Note (Signed)
 Pending echocardiogram, likely diastolic dysfunction exacerbated by progressive renal disease.  Continue furosemide  for diuresis and blood pressure control with amlodipine  and hydralazine .  Ultrafiltration on HD tomorrow

## 2024-05-15 NOTE — Plan of Care (Signed)

## 2024-05-15 NOTE — Progress Notes (Signed)
 Requested to see pt for op hd needs at discharge. Spoke with pt, pt mother, and pt daughter at bedside. Introduced self and explained role. Discussed op hd option upon discharge. Pt prefers closest clinics to home. Referrals will be made to [facility] admissions once all clinical information available. Family plans to assist pt with transportation to hd appointments at discharge. Will assist as needed.   Lavanda Tyvon Eggenberger Dialysis Navigator [phone]

## 2024-05-15 NOTE — Assessment & Plan Note (Signed)
Last A1C 01/01/21 6.2%  Plan A1C  Sliding scale coverage  Farixga 10  mg daily 

## 2024-05-15 NOTE — Progress Notes (Signed)
  Progress Note   Patient: Bobby Stevens FMW:[id number] DOB: Sep 27, 1971 DOA: 05/14/2024     1 DOS: the patient was seen and examined on 05/15/2024   Brief hospital course: Bobby Stevens was admitted to the hospital with the working diagnosis of heart failure decompensation in the setting of progressive renal disease.   53 yo male with the past medical history of CKD stage 4 to 5, hypertension, T2DM, and heart failure who presented with dyspnea. Reported 2 weeks of progressive dyspnea, associated with worsening lower extremity edema. Apparently he has not been adherent to his medications, he stopped taking blood pressure medications 6 months prior to admission.  On his initial physical examination his blood pressure was 238/159, 162/109, HR 85, 02 saturation 95% Lungs with no wheezing or rhonchi, heart with S1 and S2 present and regular with no gallops, abdomen with no distention and positive bilateral lower extremity edema.    Assessment and Plan: * Acute on chronic diastolic CHF (congestive heart failure) (HCC) Pending echocardiogram, likely diastolic dysfunction exacerbated by progressive renal disease.  Continue furosemide  for diuresis and blood pressure control with amlodipine  and hydralazine .  Ultrafiltration on HD tomorrow   Chronic kidney disease, stage 5 (HCC) Progressive renal disease to ESRD.   Follow up renal function with serum cr at 12.7 with K at 3,9 and serum bicarbonate at 20  Na 136 BUN 95  Urine output 2,585 ml with improvement in volume status.   Plan to start renal replacement therapy tomorrow.  Follow up with nephrology recommendations,   Anemia of chronic renal disease with iron  deficiency.  On  IV iron .   Essential hypertension Patient was placed on nitroglycerin  drip for blood pressure control.  Systolic blood pressure has improved but continue to have elevated diastolic pressures.   Plan for dc nitroglycerin  and start patient on hydralazine  Continue  amlodipine  Likely blood pressure will continue to improve with ultrafiltration, for now continue with IV furosemide .   DM2 (diabetes mellitus, type 2) (HCC) Continue glucose cover and monitoring with insulin  sliding scale.          Subjective: Patient is feeling better, positive headache, no dyspnea,   Physical Exam: Vitals:   05/15/24 0809 05/15/24 1115 05/15/24 1534 05/15/24 1624  BP: (!) 157/102 (!) 153/112 (!) 165/107 (!) 170/115  Pulse:  95  93  Resp:  16 14   Temp:  98.7 F (37.1 C) 99 F (37.2 C)   TempSrc:  Oral Oral   SpO2:  100%    Weight:      Height:       Neurology awake and alert ENT with no pallor Cardiovascular with S1 and S2 present and regular with no gallops, rubs or murmurs Respiratory with no rales or wheezing, no rhonchi  Abdomen with no distention  Trace lower extremity edema   Data Reviewed:    Family Communication: I spoke with patient's mother at the bedside, we talked in detail about patient's condition, plan of care and prognosis and all questions were addressed.   Disposition: Status is: Inpatient Remains inpatient appropriate because: renal replacement therapy   Planned Discharge Destination: Home     Author: Elidia Toribio Furnace, MD 05/15/2024 6:27 PM  For on call review [email or web address].

## 2024-05-15 NOTE — Assessment & Plan Note (Addendum)
 Progressive renal disease to ESRD.   Follow up renal function with serum cr at 12.7 with K at 3,9 and serum bicarbonate at 20  Na 136 BUN 95  Urine output 2,585 ml with improvement in volume status.   Plan to start renal replacement therapy tomorrow.  Follow up with nephrology recommendations,   Anemia of chronic renal disease with iron  deficiency.  On  IV iron .

## 2024-05-15 NOTE — Assessment & Plan Note (Signed)
 Patient was placed on nitroglycerin  drip for blood pressure control.  Systolic blood pressure has improved but continue to have elevated diastolic pressures.   Plan for dc nitroglycerin  and start patient on hydralazine  Continue amlodipine  Likely blood pressure will continue to improve with ultrafiltration, for now continue with IV furosemide .

## 2024-05-15 NOTE — Consult Note (Addendum)
 Hospital Consult    Reason for Consult:  dialysis access Requesting Physician:  Dr. Melia MRN #:  [id number]  History of Present Illness: This is a 53 y.o. male with past medical history significant for type 2 diabetes mellitus, CHF, hypertension, and CKD now ESRD.  He is being seen in consultation for evaluation for permanent dialysis access placement.  He is scheduled for [facility] placement by interventional radiology tomorrow prior to beginning HD.  He is right arm dominant with peripheral access placement in the left arm.  He does not take blood thinners.  He does not have a pacemaker.  He is a Technical sales engineer; he mostly plays the drums.  Past Medical History:  Diagnosis Date   Chronic diastolic heart failure (HCC)    Diabetes mellitus type 2 with complications (HCC)    Hypertension 2020   Kidney disease    Resistant hypertension 02/10/2021    Past Surgical History:  Procedure Laterality Date   NO PAST SURGERIES     None      Allergies  Allergen Reactions   Penicillins Rash    Prior to Admission medications   Medication Sig Start Date End Date Taking? Authorizing Provider  amLODipine  (NORVASC ) 10 MG tablet Take 1 tablet (10 mg total) by mouth daily. Patient not taking: Reported on 05/14/2024 10/30/23   Prosperi, Christian H, PA-C  carvedilol  (COREG ) 25 MG tablet Take 1 tablet (25 mg total) by mouth 2 (two) times daily. Patient not taking: Reported on 05/14/2024 10/30/23   Prosperi, Christian H, PA-C  chlorthalidone  (HYGROTON ) 25 MG tablet Take 0.5 tablets (12.5 mg total) by mouth daily. Patient not taking: Reported on 05/14/2024 10/30/23 10/29/24  Prosperi, Christian H, PA-C  COVID-19 mRNA vaccine [phone] (COMIRNATY ) syringe Inject into the muscle. 10/27/22   Luiz Channel, MD  doxazosin  (CARDURA ) 4 MG tablet Take 1 tablet (4 mg total) by mouth 2 (two) times daily. 06/22/22 06/22/23  Lonni Slain, MD  doxycycline  (VIBRAMYCIN ) 100 MG capsule Take 1 capsule (100 mg total) by mouth 2  (two) times daily. Patient not taking: Reported on 05/14/2024 10/30/23   Prosperi, Christian H, PA-C  hydrALAZINE  (APRESOLINE ) 100 MG tablet Take 1 tablet (100 mg total) by mouth 3 (three) times daily. Patient not taking: Reported on 05/14/2024 10/30/23   Prosperi, Christian H, PA-C  isosorbide  mononitrate (IMDUR ) 30 MG 24 hr tablet Take 1 tablet (30 mg total) by mouth daily. Patient not taking: Reported on 05/14/2024 10/30/23   Prosperi, Christian H, PA-C  metFORMIN  (GLUCOPHAGE ) 500 MG tablet Take 1 tablet (500 mg total) by mouth daily. Patient not taking: Reported on 05/14/2024 02/09/23   Dennise Hoes, MD  telmisartan  (MICARDIS ) 20 MG tablet Take 1 tablet (20 mg total) by mouth daily. Patient not taking: Reported on 05/14/2024 10/30/23   Prosperi, Christian H, PA-C  vitamin B-12 (CYANOCOBALAMIN ) 250 MCG tablet Take 250 mcg by mouth daily. Patient not taking: Reported on 05/14/2024    [provider]    Social History   Socioeconomic History   Marital status: Single    Spouse name: Not on file   Number of children: Not on file   Years of education: Not on file   Highest education level: Not on file  Occupational History   Occupation: Psychologist, occupational    Employer: [employer]  Tobacco Use   Smoking status: Former   Smokeless tobacco: Never  Vaping Use   Vaping status: Never Used  Substance and Sexual Activity   Alcohol use: Not Currently  Drug use: Never   Sexual activity: Not Currently  Other Topics Concern   Not on file  Social History Narrative   Pt lives w/ mother   Social Drivers of Health   Financial Resource Strain: Low Risk  (08/28/2020)   Overall Financial Resource Strain (CARDIA)    Difficulty of Paying Living Expenses: Not very hard  Food Insecurity: No Food Insecurity (05/14/2024)   Hunger Vital Sign    Worried About Running Out of Food in the Last Year: Never true    Ran Out of Food in the Last Year: Never true  Transportation Needs: No Transportation Needs  (05/14/2024)   PRAPARE - Administrator, Civil Service (Medical): No    Lack of Transportation (Non-Medical): No  Physical Activity: Not on file  Stress: Not on file  Social Connections: Not on file  Intimate Partner Violence: Not At Risk (05/14/2024)   Humiliation, Afraid, Rape, and Kick questionnaire    Fear of Current or Ex-Partner: No    Emotionally Abused: No    Physically Abused: No    Sexually Abused: No     Family History  Problem Relation Age of Onset   Hypertension Mother    Hypertension Father        died age 62   Hypertension Brother    Hypertension Maternal Grandmother    Hypertension Paternal Uncle    Hypertension Paternal Grandmother     ROS: Otherwise negative unless mentioned in HPI  Physical Examination  Vitals:   05/15/24 0809 05/15/24 1115  BP: (!) 157/102 (!) 153/112  Pulse:  95  Resp:  16  Temp:  98.7 F (37.1 C)  SpO2:  100%   Body mass index is 22.86 kg/m.  General:  WDWN in NAD Gait: Not observed HENT: WNL, normocephalic Pulmonary: normal non-labored breathing Cardiac: regular Abdomen:  soft, NT/ND, no masses Skin: without rashes Vascular Exam/Pulses: palpable radial pulses Extremities: without ischemic changes, without Gangrene , without cellulitis; without open wounds;  Musculoskeletal: no muscle wasting or atrophy  Neurologic: A&O X 3;  No focal weakness or paresthesias are detected; speech is fluent/normal Psychiatric:  The pt has Normal affect. Lymph:  Unremarkable  CBC    Component Value Date/Time   WBC 10.0 05/15/2024 0012   RBC 3.23 (L) 05/15/2024 0012   HGB 8.7 (L) 05/15/2024 0012   HGB 12.3 (L) 06/11/2020 1426   HCT 27.0 (L) 05/15/2024 0012   HCT 39.3 06/11/2020 1426   PLT 254 05/15/2024 0012   PLT 330 06/11/2020 1426   MCV 83.6 05/15/2024 0012   MCV 86 06/11/2020 1426   MCH 26.9 05/15/2024 0012   MCHC 32.2 05/15/2024 0012   RDW 12.4 05/15/2024 0012   RDW 12.3 06/11/2020 1426   LYMPHSABS 1.2  05/15/2024 0012   LYMPHSABS 1.0 06/11/2020 1426   MONOABS 0.4 05/15/2024 0012   EOSABS 1.2 (H) 05/15/2024 0012   EOSABS 0.3 06/11/2020 1426   BASOSABS 0.0 05/15/2024 0012   BASOSABS 0.1 06/11/2020 1426    BMET    Component Value Date/Time   NA 136 05/15/2024 0012   NA 136 08/28/2020 1146   K 3.9 05/15/2024 0012   CL 103 05/15/2024 0012   CO2 20 (L) 05/15/2024 0012   GLUCOSE 149 (H) 05/15/2024 0012   BUN 95 (H) 05/15/2024 0012   BUN 47 (H) 08/28/2020 1146   CREATININE 12.07 (H) 05/15/2024 0012   CALCIUM 8.3 (L) 05/15/2024 0012   GFRNONAA 5 (L) 05/15/2024 0012  GFRAA 19 (L) 08/28/2020 1146    COAGS: Lab Results  Component Value Date   INR 1.3 (H) 05/15/2024     Non-Invasive Vascular Imaging:   Vein mapping pending    ASSESSMENT/PLAN: This is a 53 y.o. male with ESRD being seen in consultation for permanent dialysis access placement  Mr. Franta is a 53 year old male with progressive CKD now ESRD.  HD will be initiated tomorrow after [facility] placement by interventional radiology.  He is right arm dominant and would prefer access placement in the left arm.  He has IVs in bilateral AC fossa.  We will check vein mapping of bilateral upper extremities.  Due to scheduling conflicts he will likely have to come back as an outpatient for left upper extremity AV fistula versus graft.  On-call vascular surgeon Dr. Magda will evaluate the patient later today and provide further treatment plans.   Donnice Sender PA-C [facility] [phone]  VASCULAR STAFF ADDENDUM: I have independently interviewed and examined the patient. I agree with the above.  No availability this week for permanent dialysis access creation. Plan outpatient left arm AVF vs. AVG.  Debby SAILOR. Magda, MD [facility] Phone Number: [phone] 05/15/2024 4:39 PM

## 2024-05-15 NOTE — TOC CM/SW Note (Signed)
 Transition of Care [facility]) - Inpatient Brief Assessment   Patient Details  Name: MEKEL HAVERSTOCK MRN: [id number] Date of Birth: [birth year]  Transition of Care [facility]) CM/SW Contact:    Lauraine FORBES Saa, LCSW Phone Number: 05/15/2024, 8:53 AM   Clinical Narrative:  8:53 AM Per chart review, patient resides at home with parent(s). Patient has a PCP and insurance. Patient does not have SNF/HH/DME history. Patient's preferred pharmacy's are [facility] and [facility]. No TOC needs were identified at this time. TOC will continue to follow and be available to assist.  Transition of Care Asessment: Insurance and Status: Insurance coverage has been reviewed Patient has primary care physician: Yes Home environment has been reviewed: Private Residence Prior level of function:: N/A Prior/Current Home Services: No current home services Social Drivers of Health Review: SDOH reviewed no interventions necessary Readmission risk has been reviewed: Yes Transition of care needs: no transition of care needs at this time

## 2024-05-15 NOTE — Consult Note (Signed)
 Chief Complaint: Patient was seen in consultation today for ESRD Chief Complaint  Patient presents with   Shortness of Breath   at the request of Melia Agent   Referring Physician(s): Melia Agent  Supervising Physician: Jennefer Rover  Patient Status: [facility] - In-pt  History of Present Illness: Bobby Stevens is a 53 y.o. male with PMHs of CKD stage IV/V, with baseline creatinine between 4-5, essential hypertension, chronic diastolic heart failure, type 2 diabetes mellitus, admitted due to ARF with SOB, IR was consulted for tunneled HD cath placement.   Patient presented to ED on 7/29 with SOB, admitted due to acute on chronic diastolic heart failure complicated by acute renal failure superimposed on CKD stage IV/V. Nephrology was consulted and tunneled HD cath placement with initiation of HD was recommended to the patient which he decided to proceed. Vascular surgery was also consulted, he will be evaluated for AVF creation.   Patient laying in bed, not in acute distress. Sister at bedside  Reports HA that has been going on for a while.  Denise fever, chills, shortness of breath, cough, chest pain, abdominal pain, nausea ,vomiting, and bleeding.    Past Medical History:  Diagnosis Date   Chronic diastolic heart failure (HCC)    Diabetes mellitus type 2 with complications (HCC)    Hypertension 2020   Kidney disease    Resistant hypertension 02/10/2021    Past Surgical History:  Procedure Laterality Date   NO PAST SURGERIES     None      Allergies: Penicillins  Medications: Prior to Admission medications   Medication Sig Start Date End Date Taking? Authorizing Provider  amLODipine  (NORVASC ) 10 MG tablet Take 1 tablet (10 mg total) by mouth daily. Patient not taking: Reported on 05/14/2024 10/30/23   Prosperi, Christian H, PA-C  carvedilol  (COREG ) 25 MG tablet Take 1 tablet (25 mg total) by mouth 2 (two) times daily. Patient not taking: Reported on 05/14/2024 10/30/23    Prosperi, Christian H, PA-C  chlorthalidone  (HYGROTON ) 25 MG tablet Take 0.5 tablets (12.5 mg total) by mouth daily. Patient not taking: Reported on 05/14/2024 10/30/23 10/29/24  Prosperi, Christian H, PA-C  COVID-19 mRNA vaccine [phone] (COMIRNATY ) syringe Inject into the muscle. 10/27/22   Luiz Channel, MD  doxazosin  (CARDURA ) 4 MG tablet Take 1 tablet (4 mg total) by mouth 2 (two) times daily. 06/22/22 06/22/23  Lonni Slain, MD  doxycycline  (VIBRAMYCIN ) 100 MG capsule Take 1 capsule (100 mg total) by mouth 2 (two) times daily. Patient not taking: Reported on 05/14/2024 10/30/23   Prosperi, Christian H, PA-C  hydrALAZINE  (APRESOLINE ) 100 MG tablet Take 1 tablet (100 mg total) by mouth 3 (three) times daily. Patient not taking: Reported on 05/14/2024 10/30/23   Prosperi, Christian H, PA-C  isosorbide  mononitrate (IMDUR ) 30 MG 24 hr tablet Take 1 tablet (30 mg total) by mouth daily. Patient not taking: Reported on 05/14/2024 10/30/23   Prosperi, Christian H, PA-C  metFORMIN  (GLUCOPHAGE ) 500 MG tablet Take 1 tablet (500 mg total) by mouth daily. Patient not taking: Reported on 05/14/2024 02/09/23   Dennise Hoes, MD  telmisartan  (MICARDIS ) 20 MG tablet Take 1 tablet (20 mg total) by mouth daily. Patient not taking: Reported on 05/14/2024 10/30/23   Prosperi, Christian H, PA-C  vitamin B-12 (CYANOCOBALAMIN ) 250 MCG tablet Take 250 mcg by mouth daily. Patient not taking: Reported on 05/14/2024    [provider]     Family History  Problem Relation Age of Onset   Hypertension  Mother    Hypertension Father        died age 41   Hypertension Brother    Hypertension Maternal Grandmother    Hypertension Paternal Uncle    Hypertension Paternal Grandmother     Social History   Socioeconomic History   Marital status: Single    Spouse name: Not on file   Number of children: Not on file   Years of education: Not on file   Highest education level: Not on file  Occupational History    Occupation: Artist: [employer]  Tobacco Use   Smoking status: Former   Smokeless tobacco: Never  Advertising account planner   Vaping status: Never Used  Substance and Sexual Activity   Alcohol use: Not Currently   Drug use: Never   Sexual activity: Not Currently  Other Topics Concern   Not on file  Social History Narrative   Pt lives w/ mother   Social Drivers of Health   Financial Resource Strain: Low Risk  (08/28/2020)   Overall Financial Resource Strain (CARDIA)    Difficulty of Paying Living Expenses: Not very hard  Food Insecurity: No Food Insecurity (05/14/2024)   Hunger Vital Sign    Worried About Running Out of Food in the Last Year: Never true    Ran Out of Food in the Last Year: Never true  Transportation Needs: No Transportation Needs (05/14/2024)   PRAPARE - Administrator, Civil Service (Medical): No    Lack of Transportation (Non-Medical): No  Physical Activity: Not on file  Stress: Not on file  Social Connections: Not on file     Review of Systems: A 12 point ROS discussed and pertinent positives are indicated in the HPI above.  All other systems are negative.  Vital Signs: BP (!) 165/107 (BP Location: Left Arm)   Pulse 95   Temp 99 F (37.2 C) (Oral)   Resp 14   Ht 6' 1 (1.854 m)   Wt 173 lb 4.5 oz (78.6 kg)   SpO2 100%   BMI 22.86 kg/m    Physical Exam Vitals reviewed.  Constitutional:      General: He is not in acute distress.    Appearance: He is not ill-appearing.  HENT:     Head: Normocephalic and atraumatic.  Cardiovascular:     Rate and Rhythm: Normal rate and regular rhythm.  Pulmonary:     Effort: Pulmonary effort is normal.     Comments: Crackle in LLL Skin:    General: Skin is warm and dry.     Coloration: Skin is not cyanotic or pale.  Neurological:     Mental Status: He is alert and oriented to person, place, and time.  Psychiatric:        Mood and Affect: Mood normal. Mood is not anxious.         Behavior: Behavior normal.     MD Evaluation Airway: WNL Heart: WNL Abdomen: WNL Chest/ Lungs: WNL ASA  Classification: 3 Mallampati/Airway Score: Two  Imaging: US  RENAL Result Date: 05/15/2024 CLINICAL DATA:  Acute renal failure EXAM: RENAL / URINARY TRACT ULTRASOUND COMPLETE COMPARISON:  None Available. FINDINGS: Right Kidney: Renal measurements: 9.7 x 3.9 x 4.2 cm = volume: 82 mL. Renal cortical echogenicity is diffusely increased in keeping with changes of underlying medical renal disease. Cortical thickness is preserved. No intrarenal masses or calcifications. No hydronephrosis. No perinephric fluid collections are seen. Left Kidney: Renal measurements: 10.1 x 4.1 x  3.4 cm = volume: 73 mL. Renal cortical echogenicity is diffusely increased in keeping with changes of underlying medical renal disease. Cortical thickness is preserved. No intrarenal masses or calcifications. No hydronephrosis. No perinephric fluid collections are seen. 2.3 cm simple cyst is seen within the interpolar region of the left kidney for which no follow-up imaging is recommended. Bladder: Appears normal for degree of bladder distention. Other: Moderate left pleural effusion. IMPRESSION: 1. Diffusely increased renal cortical echogenicity in keeping with changes of underlying medical renal disease. No hydronephrosis. 2. Moderate left pleural effusion. Electronically Signed   By: Dorethia Molt M.D.   On: 05/15/2024 01:58   DG Chest Port 1 View Result Date: 05/14/2024 EXAM: 1 VIEW XRAY OF THE CHEST 05/14/2024 06:01:04 AM COMPARISON: 10/30/2023 CLINICAL HISTORY: Shortness of breath. SOB x 2 weeks, worse tonight. Pt reports non-compliance with blood pressure meds x 6 months. Hx diabetes and HTN. FINDINGS: LUNGS AND PLEURA: No focal pulmonary opacity. No pulmonary edema. No pleural effusion. No pneumothorax. Mild diffuse increase in interstitial markings. HEART AND MEDIASTINUM: Stable mild cardiac enlargement. BONES AND SOFT  TISSUES: No acute osseous abnormality. Osseous structures appear intact. IMPRESSION: 1. Stable mild cardiac enlargement with mild diffuse increase in interstitial markings. Increased interstitial markings may be seen with pulmonary edema or atypical infection 2. No pleural effusion or consolidative change. Electronically signed by: Waddell Calk MD 05/14/2024 06:13 AM EDT RP Workstation: [id number]    Labs:  CBC: Recent Labs    10/30/23 1140 05/14/24 0548 05/15/24 0012  WBC 17.4* 10.5 10.0  HGB 14.2 10.0* 8.7*  HCT 44.1 30.5* 27.0*  PLT 366 298 254    COAGS: Recent Labs    05/15/24 0314  INR 1.3*  APTT 33    BMP: Recent Labs    05/14/24 0548 05/14/24 1419 05/15/24 0012 05/15/24 1455  NA 140 138 136 137  K 3.6 3.6 3.9 3.8  CL 101 102 103 104  CO2 21* 19* 20* 20*  GLUCOSE 105* 140* 149* 119*  BUN 87* 94* 95* 95*  CALCIUM 9.0 8.6* 8.3* 8.2*  CREATININE 11.90* 12.00* 12.07* 12.49*  GFRNONAA 5* 5* 5* 4*    LIVER FUNCTION TESTS: Recent Labs    10/30/23 1140 05/14/24 0548 05/15/24 0012 05/15/24 1455  BILITOT 0.9 0.6 0.5  --   AST 18 21 16   --   ALT 14 23 18   --   ALKPHOS 78 74 52  --   PROT 7.5 6.8 5.7*  --   ALBUMIN 3.2* 4.1 2.9* 2.8*    TUMOR MARKERS: No results for input(s): AFPTM, CEA, CA199, CHROMGRNA in the last 8760 hours.  Assessment and Plan: 53 y.o. male with AKI on CKD with SOB who is in need of tunneled HD cath.   CBC w/o leukocytosis, afebrile  BUN 95, creatinine 12.7, GFR 5  Allergic to penicillin   Risks and benefits discussed with the patient including, but not limited to bleeding, infection, vascular injury, pneumothorax which may require chest tube placement, air embolism or even death  All of the patient's questions were answered, patient is agreeable to proceed. Consent signed and in chart.  The procedure is scheduled for tomorrow pending IR schedule.   PLAN - NPO at MN except meds - 1 g vanc signed and held   Thank  you for this interesting consult.  I greatly enjoyed meeting Bobby Stevens and look forward to participating in their care.  A copy of this report was sent to the requesting provider  on this date.  Electronically Signed: Toya VEAR Cousin, PA-C 05/15/2024, 3:47 PM   I spent a total of 40 Minutes    in face to face in clinical consultation, greater than 50% of which was counseling/coordinating care for tunneled HD cath placement.   This chart was dictated using voice recognition software.  Despite best efforts to proofread,  errors can occur which can change the documentation meaning.

## 2024-05-15 NOTE — H&P (Signed)
 History and Physical      Bobby Stevens FMW:[id number] DOB: 11/03/1970 DOA: 05/14/2024; DOS: 05/15/2024  PCP: Celestia Rosaline SQUIBB, NP  Patient coming from: home   I have personally briefly reviewed patient's old medical records in [facility]  Chief Complaint: Shortness of breath  HPI: Bobby Stevens is a 53 y.o. male with medical history significant for CKD stage IV/V, with baseline creatinine between 4-5, essential hypertension, chronic diastolic heart failure, type 2 diabetes mellitus, who is admitted to [facility] on 05/14/2024 by way of transfer from [facility] with acute on chronic diastolic heart failure complicated by acute renal failure superimposed on CKD stage IV/V after presenting from home to the latter facility complaining of shortness of breath.  The patient reports 2 weeks of progressive shortness of breath associated with mild nonproductive cough over that timeframe, as well as associated with worsening of edema in the bilateral lower extremities.  Denies any associated chest pain or any associated objective fever, chills, rigors, or generalized myalgias over that timeframe.  No hemoptysis or wheezing.  Denies any known history of chronic underlying pulmonary pathology.  He has a history of essential hypertension all that has been reported to be difficult to control.  He notes that he independently stopped taking all of his antihypertensive medication 6 months ago, due to his perception of GI symptoms as a consequence of these antihypertensive medications, mainly noting recurrent nausea/vomiting after taking his blood pressure medications.  Specifically, prior to his independent discontinuation thereof, his outpatient antihypertensive regimen included the following: Norvasc , Coreg , chlorthalidone , hydralazine , Imdur , and telmisartan .  His medical history is also notable for stage IV/V chronic kidney disease with baseline creatinine in the range of  4-5, with most recent prior creatinine data point noted to be 4.59 in January 2025.  He also has a history of chronic diastolic heart failure, with most recent prior echocardiogram performed in June 2022, which was notable for LVEF 55 to 60%, no focal motion maladies, mild LVH, grade 1 diastolic dysfunction, normal right ventricular systolic function, and trivial mitral regurgitation.  He notes that he continues to produce urine, and denies any recent dysuria or gross hematuria.    [facility] ED Course:  Vital signs in the ED were notable for the following: Afebrile; heart rates in the low 100s initially, steadily decreasing into the 90s following initiation of IV diuresis efforts; initial blood pressure 238/159, systolic blood pressure subsequently improving into the 160s following initiation of nitroglycerin  drip and diuresis efforts; respiratory rate 19-20, with most recent respiratory rate 18-20, and oxygen saturation 95 to 100% on room air.  Labs were notable for the following: CMP was notable for the following: Sodium 140, potassium 3.6, bicarbonate 21, anion gap 18, creatinine 11.90, glucose 105, calcium 9.0, liver enzymes within normal limits.  Magnesium level 2.3.  CBC notable for white cell count 10,500, hemoglobin 10 associated Neuraceq/) properties and nonelevated RDW, which is relative to most recent prior hemoglobin value of 14.2 in January 2025.  BNP 30,600, without any prior BNP data point available for point comparison.  High sensitive troponin I was 161 with repeat value trending up to 138, which is relative to preceding value of 101 in January 2025.  Following initiation of IV Lasix  at [facility], updated BMP at [facility] showed the following: Sodium 138, potassium 3.6, creatinine 12.0.  Per my interpretation, EKG in ED demonstrated the following: In comparison to most  recent prior EKG from 10/30/2023, today's EKG shows sinus tachycardia with heart  rate 129, QTc prolongation at 503, otherwise, normal intervals, nonspecific T wave flattening in leads I, aVL, which appear unchanged from most recent prior EKG, while showing nonspecific less than 1 mm ST depression in leads II and aVF, which also prn change from most recent prior EKG, and showing nonspecific left will no major ST elevation in V3, which also appears unchanged, will demonstrate no evidence of additional ST elevation.  Imaging in the ED, per corresponding formal radiology read, was notable for the following: Chest x-ray showed mild interstitial edema, without evidence of pleural effusion, infiltrate, or pneumothorax.  EDP at [facility] discussed with on-call nephrology, Dr. Melia, who recommended diuresis and conveyed that nephrology will consult.  While in the ED, the following were administered: Norvasc  5 mg p.o. x 1 dose, Lasix  80 mg IV x 1 dose, labetalol  20 mg IV x 1 dose, as well as initiation of nitroglycerin  drip.  In response to the 80 g of IV Lasix  x 1 dose, there was documentation of a total urine output of 1000.  35 cc, also noting 3 unmeasured urine occurrences.  The patient reports improvement in his shortness of breath as well as improving swelling of the bilateral lower extremities following initiation of IV Lasix .   Subsequently, the patient was admitted to [facility] for further evaluation management of presenting acute on chronic diastolic heart failure in the setting of acute renal failure superimposed on CKD stage IV 5 with evidence of hypertensive crisis, QTc prolongation, and with presenting labs also notable for mild elevation in troponin as well as new diagnosis of normocytic anemia.     Review of Systems: As per HPI otherwise 10 point review of systems negative.   Past Medical History:  Diagnosis Date   Chronic diastolic heart failure (HCC)    Diabetes mellitus type 2 with complications (HCC)    Hypertension 2020   Kidney disease    Resistant hypertension  02/10/2021    Past Surgical History:  Procedure Laterality Date   NO PAST SURGERIES     None      Social History:  reports that he has quit smoking. He has never used smokeless tobacco. He reports that he does not currently use alcohol. He reports that he does not use drugs.   Allergies  Allergen Reactions   Penicillins Rash    Family History  Problem Relation Age of Onset   Hypertension Mother    Hypertension Father        died age 48   Hypertension Brother    Hypertension Maternal Grandmother    Hypertension Paternal Uncle    Hypertension Paternal Grandmother     Family history reviewed and not pertinent    Prior to Admission medications   Medication Sig Start Date End Date Taking? Authorizing Provider  amLODipine  (NORVASC ) 10 MG tablet Take 1 tablet (10 mg total) by mouth daily. Patient not taking: Reported on 05/14/2024 10/30/23   Prosperi, Christian H, PA-C  carvedilol  (COREG ) 25 MG tablet Take 1 tablet (25 mg total) by mouth 2 (two) times daily. Patient not taking: Reported on 05/14/2024 10/30/23   Prosperi, Christian H, PA-C  chlorthalidone  (HYGROTON ) 25 MG tablet Take 0.5 tablets (12.5 mg total) by mouth daily. Patient not taking: Reported on 05/14/2024 10/30/23 10/29/24  Prosperi, Christian H, PA-C  COVID-19 mRNA vaccine [phone] (COMIRNATY ) syringe Inject into the muscle. 10/27/22   Luiz Channel, MD  doxazosin  (CARDURA ) 4  MG tablet Take 1 tablet (4 mg total) by mouth 2 (two) times daily. 06/22/22 06/22/23  Lonni Slain, MD  doxycycline  (VIBRAMYCIN ) 100 MG capsule Take 1 capsule (100 mg total) by mouth 2 (two) times daily. Patient not taking: Reported on 05/14/2024 10/30/23   Prosperi, Christian H, PA-C  hydrALAZINE  (APRESOLINE ) 100 MG tablet Take 1 tablet (100 mg total) by mouth 3 (three) times daily. Patient not taking: Reported on 05/14/2024 10/30/23   Prosperi, Christian H, PA-C  isosorbide  mononitrate (IMDUR ) 30 MG 24 hr tablet Take 1 tablet (30 mg total) by  mouth daily. Patient not taking: Reported on 05/14/2024 10/30/23   Prosperi, Christian H, PA-C  metFORMIN  (GLUCOPHAGE ) 500 MG tablet Take 1 tablet (500 mg total) by mouth daily. Patient not taking: Reported on 05/14/2024 02/09/23   Dennise Hoes, MD  telmisartan  (MICARDIS ) 20 MG tablet Take 1 tablet (20 mg total) by mouth daily. Patient not taking: Reported on 05/14/2024 10/30/23   Prosperi, Christian H, PA-C  vitamin B-12 (CYANOCOBALAMIN ) 250 MCG tablet Take 250 mcg by mouth daily. Patient not taking: Reported on 05/14/2024    [provider]     Objective    Physical Exam: Vitals:   05/14/24 2252 05/14/24 2300 05/15/24 0000 05/15/24 0100  BP: (!) 162/109 (!) 164/112 (!) 165/104 (!) 159/113  Pulse: 85 84 87 90  Resp:      Temp: 98.6 F (37 C)     TempSrc: Oral     SpO2: 97% 96% 95% 98%  Weight:      Height:        General: appears to be stated age; alert, oriented Skin: warm, dry, no rash Head:  AT/[place] Mouth:  Oral mucosa membranes appear moist, normal dentition Neck: supple; trachea midline Heart:  RRR; did not appreciate any M/R/G Lungs: CTAB, did not appreciate any wheezes, rales, or rhonchi Abdomen: + BS; soft, ND, NT Vascular: 2+ pedal pulses b/l; 2+ radial pulses b/l Extremities: Trace edema in the bilateral lower extremities, no muscle wasting    Labs on Admission: I have personally reviewed following labs and imaging studies  CBC: Recent Labs  Lab 05/14/24 0548 05/15/24 0012  WBC 10.5 10.0  NEUTROABS 7.5 7.1  HGB 10.0* 8.7*  HCT 30.5* 27.0*  MCV 82.7 83.6  PLT 298 254   Basic Metabolic Panel: Recent Labs  Lab 05/14/24 0548 05/14/24 1419 05/15/24 0012  NA 140 138 136  K 3.6 3.6 3.9  CL 101 102 103  CO2 21* 19* 20*  GLUCOSE 105* 140* 149*  BUN 87* 94* 95*  CREATININE 11.90* 12.00* 12.07*  CALCIUM 9.0 8.6* 8.3*  MG 2.3  --  2.2  PHOS  --   --  7.9*   GFR: Estimated Creatinine Clearance: 8 mL/min (A) (by C-G formula based on SCr of 12.07  mg/dL (H)). Liver Function Tests: Recent Labs  Lab 05/14/24 0548 05/15/24 0012  AST 21 16  ALT 23 18  ALKPHOS 74 52  BILITOT 0.6 0.5  PROT 6.8 5.7*  ALBUMIN 4.1 2.9*   No results for input(s): LIPASE, AMYLASE in the last 168 hours. No results for input(s): AMMONIA in the last 168 hours. Coagulation Profile: No results for input(s): INR, PROTIME in the last 168 hours. Cardiac Enzymes: No results for input(s): CKTOTAL, CKMB, CKMBINDEX, TROPONINI in the last 168 hours. BNP (last 3 results) Recent Labs    05/14/24 0548  PROBNP 30,686.0*   HbA1C: No results for input(s): HGBA1C in the last 72 hours. CBG:  No results for input(s): GLUCAP in the last 168 hours. Lipid Profile: No results for input(s): CHOL, HDL, LDLCALC, TRIG, CHOLHDL, LDLDIRECT in the last 72 hours. Thyroid Function Tests: No results for input(s): TSH, T4TOTAL, FREET4, T3FREE, THYROIDAB in the last 72 hours. Anemia Panel: No results for input(s): VITAMINB12, FOLATE, FERRITIN, TIBC, IRON , RETICCTPCT in the last 72 hours. Urine analysis:    Component Value Date/Time   COLORURINE YELLOW 05/09/2020 2051   APPEARANCEUR CLEAR 05/09/2020 2051   LABSPEC 1.012 05/09/2020 2051   PHURINE 6.0 05/09/2020 2051   GLUCOSEU 50 (A) 05/09/2020 2051   HGBUR SMALL (A) 05/09/2020 2051   BILIRUBINUR NEGATIVE 05/09/2020 2051   KETONESUR NEGATIVE 05/09/2020 2051   PROTEINUR >=300 (A) 05/09/2020 2051   NITRITE NEGATIVE 05/09/2020 2051   LEUKOCYTESUR NEGATIVE 05/09/2020 2051    Radiological Exams on Admission: DG Chest Port 1 View Result Date: 05/14/2024 EXAM: 1 VIEW XRAY OF THE CHEST 05/14/2024 06:01:04 AM COMPARISON: 10/30/2023 CLINICAL HISTORY: Shortness of breath. SOB x 2 weeks, worse tonight. Pt reports non-compliance with blood pressure meds x 6 months. Hx diabetes and HTN. FINDINGS: LUNGS AND PLEURA: No focal pulmonary opacity. No pulmonary edema. No pleural effusion.  No pneumothorax. Mild diffuse increase in interstitial markings. HEART AND MEDIASTINUM: Stable mild cardiac enlargement. BONES AND SOFT TISSUES: No acute osseous abnormality. Osseous structures appear intact. IMPRESSION: 1. Stable mild cardiac enlargement with mild diffuse increase in interstitial markings. Increased interstitial markings may be seen with pulmonary edema or atypical infection 2. No pleural effusion or consolidative change. Electronically signed by: Waddell Calk MD 05/14/2024 06:13 AM EDT RP Workstation: [id number]      Assessment/Plan   Principal Problem:   Acute on chronic diastolic CHF (congestive heart failure) (HCC) Active Problems:   Hypertensive crisis   Normocytic anemia   ARF (acute renal failure) (HCC)   Elevated troponin   Prolonged QT interval   DM2 (diabetes mellitus, type 2) (HCC)     #) Acute on chronic diastolic heart failure: In the context of a documented history of chronic diastolic heart failure, with most recent echocardiogram from June 2022 showing grade 1 diastolic dysfunction, with additional results above, diagnosis of exacerbation on the basis of presenting 2 weeks of progressive shortness of breath associated with mild nonproductive cough, worsening of edema in the bilateral lower extremities, left greater than right, with BNP greater than 30,000 and presenting chest x-ray showing interval development of interstitial edema.   Suspect that this is multifactorial in etiology, including as a consequence of suboptimal afterload reduction given the patient's independent discontinuation of all of his outpatient hypertensive medications, along with ensuing worsening of renal function, now with acute renal failure superimposed on his baseline CKD stage IV/V, as further detailed below.   ACS appears less likely in the absence of any recent chest pain, will EKG shows no evidence of acute ischemic changes, and is unchanged from most recent prior EKG from  10/30/2023.  He has very mild elevation in troponin, which is now trending down, and felt to be more consistent with supply/demand mismatch as a consequence of his acutely decompensated heart failure itself as well as contribution from hypertensive crisis, as well as diminished renal clearance as a result of presenting acute renal failure.   He appears to be responding to IV Lasix  initiated [facility].  Will pursue additional IV diuresis efforts, along with further evaluation management of presenting acute renal failure, as well as additional antihypertensive management, as further detailed below.  Plan: Lasix  80 mg IV twice daily, with next dose to occur now.  Monitor strict I's and O's and daily weights.  Repeat echocardiogram in the morning.  Repeat BNP in the morning.  Continue nitroglycerin  drip for afterload, preload reduction, as further detailed below.  Further evaluation management of acute renal failure superimposed on CKD stage IV/V, as below.                   #) Acute renal failure superimposed on CKD 4/5: Relative to his baseline creatinine range of 4-5, with most recent prior creatinine value of 4.59 in January 2025, creatinine today is noted to be elevated to 11.90.  Suspect that this is multifactorial in etiology, including likely contribution from progression of hypertensive nephropathy given the patient's interval independent discontinuation of all of his outpatient hypertensive medications following the most recent prior creatinine data point in January 2025.  Suspect that this was a primary factor leading to the worsening of his renal function.  There may also be a secondary consequence of resultant acute on chronic diastolic heart failure contributing to a diminished renal perfusion gradient.  Will continue IV diuresis efforts, and pursue further diagnostic evaluation for the patient's acute renal failure, as below.   No evidence of acute respiratory distress,  and the patient is maintaining oxygen saturations in the high 90s to 100% on room air.  Potassium has been nonelevated, with most recent potassium level this evening found to be 3.9.  He has mild anion gap metabolic acidosis and is noted to be awake alert and oriented.   EDP at [facility] discussed with on-call nephrology, Dr. Melia, who recommended diuresis and conveyed that nephrology will consult; I have notified on-call nephrology, Dr. Macel, of the pt's arrival at [facility].   Plan: monitor strict I's & O's and daily weights. Attempt to avoid nephrotoxic agents. Refrain from NSAIDs. Repeat CMP in the morning. Check serum magnesium level.  Check urinalysis with microscopy.  Add-on random urine sodium and random urine creatinine.  Lasix  80 mg IV twice daily.  Nephrology consult, as above.  Have ordered renal ultrasound.  Further evaluation management of presenting acute on chronic diastolic heart failure, as above.  Further evaluation management of presenting hypertensive crisis, including emphasis to the patient of the importance of improved compliance with outpatient hypertensive medications.                     # (Hypertensive crisis: Presenting blood pressure noted to be 238/159.  this is in the context of the patient's independent discontinuation of all his home and hypertensive medications 6 months ago, with these in hypertensive medications listed above, along with secondary contribution from ensuing development of acute renal failure.  Appears consistent with hypertensive emergency given interval worsening in renal function as well as mildly elevated troponin.  Blood pressure improving on nitroglycerin  drip, with systolic blood pressures down to the 160s mmHg. 15-35% reduction in systolic blood pressure over the initial 24 hours is associated with a goal systolic blood pressures in the range of 155-202 mmHg.    Plan: Continue nitroglycerin  drip, per above.  Resume outpatient Norvasc .   Monitor strict I's and O's Daily weights.  Monitor on telemetry.  No says the importance of improved compliance with outpatient antihypertensive medications.  Further evaluation management of presenting acute renal failure superimposed on CKD 4/5.  IV Lasix , as above.  Renal ultrasound.  Check urinary drug screen.  Repeat CMP in the morning.  Check  urinalysis with microscopy.                    #) Normocytic anemia: New diagnosis of normocytic anemia with presenting hemoglobin of 10, relative to most recent prior value of 14.2 in January 2025.  Suspect that this is as a consequence of the patient's interval development of acute renal failure.  No evidence of acute blood loss.  Not on any blood thinners as an outpatient.  Plan: Further evaluation management presenting acute renal failure superimposed on CKD stage IV/V.  Check iron  studies, PTT, INR.  Type and screen ordered.  Repeat CBC in the morning.                       #) QTc prolongation: Presenting EKG demonstrates QTc of 503 ms. outpatient medications that may be contributing to QTc prolongation: None.  Presenting magnesium level 2.3, with presenting potassium level 3.6.   Plan: Monitor on telemetry.  Repeat EKG in the morning to monitor interval degree of QTc prolongation.                       #) Type 2 Diabetes Mellitus: documented history of such.  At this time, the patient is managing via lifestyle modifications, after independently discontinuing his will outpatient oral hypoglycemic medication, metformin , in January 2025.  Not on any insulin  as an outpatient.  No prior hemoglobin A1c level available per initial chart review.  Presenting blood sugar 105.  Plan: accuchecks QAC and HS with very low dose SSI.  Add on hemoglobin A1c level.        DVT prophylaxis: SCD's   Code Status: Full code Family Communication: none Disposition Plan: Per Rounding Team Consults called: EDP  at [facility] discussed with on-call nephrology, Dr. Melia, who recommended diuresis and conveyed that nephrology will consult; I have notified on-call nephrology, Dr. Macel, of the pt's arrival at [facility].;  Admission status: Inpatient     I SPENT GREATER THAN 75  MINUTES IN CLINICAL CARE TIME/MEDICAL DECISION-MAKING IN COMPLETING THIS ADMISSION.      Eva NOVAK Angeliah Wisdom DO [facility]  From 7PM - 7AM   05/15/2024, 1:36 AM

## 2024-05-15 NOTE — Consult Note (Addendum)
 [facility] Renal Consultation Note  Requesting MD:  Indication for Consultation:   Assessment/Plan:  Acute Renal Failure, progressing to ESRD  Prior CKD stage V, eGFR 14  Presented to ED with Scr 11.90, baseline Scr 4.59 noted 10/2023; labs pertinent for BUN 87. UA showed small blood and proteinuria. FeNa 17% indicating intrinsic cause, post IV lasix  80 mg. RUQ noted diffusely increased renal cortical echogenicity, no hydronephrosis.  No obstruction noted.  Patient stopped taking all of his medications about 6 months ago, etiology likely in the setting of chronic uncontrolled hypertension and volume overload resulting in acute renal failure.  On exam this morning, minimal crackles at R lung base. No LE edema or JVD.  Unfortunately he has just progressed to ESRD; he was told a while ago that he had very advanced kidney disease which will need dialysis in the near future.  Most conversations scared the patient away but he understands the need for dialysis at this current time.   Will request TDC by VIR for tomorrow given that the patient has already been eating a full meal including lunch. NPO p MN.   Plan on starting dialysis tomorrow  - Scr 12.07 (12.00), BUN 95 (94) this AM  - UOP 2.5 L, net -2.3 L  - Renally dose medications  - Strict Is and Os and daily weights - Can be referred for Renal transplant at [facility] once patient establishes at a dialysis unit - Will anticipate placement of AV fistula on the R arm in the future; likely will need to be outpt but will check with [facility].  - PLEASE RESTRICT LEFT ARM for future fistula. BP, IV and Labs on the R arm.   Iron  deficiency Anemia Anemia of chronic kidney disease  Presented with Hgb 10. Iron  studies indicating low saturation ratios 9 and iron  31.   - Will load w/ IV iron  replacement therapy   Renal osteodystrophy Check a phosphorus, 25 vitamin D  and PTH level  Hypertensive Crisis  Resistant HTN/hypertensive  cardiomyopathy  Patient stopped taking all medications about 6 months ago. Blood pressures uncontrolled. Currently on amlodipine  10 mg, UF with dialysis will help as well.   HFrecEF Exacerbation Elevated troponins  Moderate left pleural effusion BNP 1,373 with symptoms of DOE and volume overload.  Chest x-ray noted mild diffuse increase in interstitial markings, edema.  ECHO pending. - manage by primary  I have independently taken a history, reviewed the chart and examined the patient. This patient encounter includes evaluation, adjustment of therapies in at least one of the key components with review of  Dr. Laray note, impression and recommendations.    I've also already edited and made adjustments to the above mentioned note.    Bobby LYNWOOD ORN, MD 05/15/2024, 12:06 PM  HPI:  Bobby Stevens is a 53 y.o. male with past medical history significant for CKD stage IV/V, HTN, chronic diastolic heart failure, type 2 diabetes mellitus who presented to med [facility] ED for [facility] HF exacerbation and acute renal failure, transferred to [facility] 05/14/2024.  Patient reports symptom onset about 2 weeks ago, reports symptoms of shortness of breath, dyspnea on exertion, orthopnea, PND, lower extremity swelling, decrease in appetite, metallic taste in the back of the mouth.  Denies any nausea, vomiting, abdominal pain.  Denies any chest pain.  No fevers or headaches.  Patient reports that he was taking naproxen 2-3 times a week for not feeling well.  Patient reports that he stopped taking all  of his medications about 6 months ago, reports that the medications he was taking caused him to feel nauseous.  So he decided to stop taking all of his medications.  Patient reports that he is aware that he has underlying chronic kidney disease, reports that he used to follow Dr. Dennise at the [facility], does not know the last time he followed.  Patient denies any family members on  dialysis.  Presently, patient reports overall feeling improved in terms of breathing compared to yesterday.  Patient reports that he does not have any current chest pain or shortness of breath.  Reports breathing is better, and lower extremity edema has also improved.  Denies any urinary symptoms at this time.    Creatinine, Ser  Date/Time Value Ref Range Status  05/15/2024 12:12 AM 12.07 (H) 0.61 - 1.24 mg/dL Final  92/70/7974 97:80 PM 12.00 (H) 0.61 - 1.24 mg/dL Final  92/70/7974 94:51 AM 11.90 (H) 0.61 - 1.24 mg/dL Final  98/86/7974 88:59 AM 4.59 (H) 0.61 - 1.24 mg/dL Final  88/87/7978 88:53 AM 4.01 (H) 0.76 - 1.27 mg/dL Final  91/73/7978 97:73 PM 3.70 (H) 0.76 - 1.27 mg/dL Final  92/69/7978 94:68 AM 4.89 (H) 0.61 - 1.24 mg/dL Final  92/70/7978 95:89 AM 5.13 (H) 0.61 - 1.24 mg/dL Final  92/71/7978 90:98 AM 5.03 (H) 0.61 - 1.24 mg/dL Final  92/72/7978 96:45 AM 4.93 (H) 0.61 - 1.24 mg/dL Final  92/73/7978 96:57 AM 4.80 (H) 0.61 - 1.24 mg/dL Final  92/74/7978 94:92 AM 4.61 (H) 0.61 - 1.24 mg/dL Final  92/75/7978 87:59 PM 4.55 (H) 0.61 - 1.24 mg/dL Final   PMHx:   Past Medical History:  Diagnosis Date   Chronic diastolic heart failure (HCC)    Diabetes mellitus type 2 with complications (HCC)    Hypertension 2020   Kidney disease    Resistant hypertension 02/10/2021    Past Surgical History:  Procedure Laterality Date   NO PAST SURGERIES     None      Family Hx:  Family History  Problem Relation Age of Onset   Hypertension Mother    Hypertension Father        died age 59   Hypertension Brother    Hypertension Maternal Grandmother    Hypertension Paternal Uncle    Hypertension Paternal Grandmother     Social History:  reports that he has quit smoking. He has never used smokeless tobacco. He reports that he does not currently use alcohol. He reports that he does not use drugs.  Allergies:  Allergies  Allergen Reactions   Penicillins Rash    Medications: Prior  to Admission medications   Medication Sig Start Date End Date Taking? Authorizing Provider  amLODipine  (NORVASC ) 10 MG tablet Take 1 tablet (10 mg total) by mouth daily. Patient not taking: Reported on 05/14/2024 10/30/23   Prosperi, Christian H, PA-C  carvedilol  (COREG ) 25 MG tablet Take 1 tablet (25 mg total) by mouth 2 (two) times daily. Patient not taking: Reported on 05/14/2024 10/30/23   Prosperi, Christian H, PA-C  chlorthalidone  (HYGROTON ) 25 MG tablet Take 0.5 tablets (12.5 mg total) by mouth daily. Patient not taking: Reported on 05/14/2024 10/30/23 10/29/24  Prosperi, Christian H, PA-C  COVID-19 mRNA vaccine [phone] (COMIRNATY ) syringe Inject into the muscle. 10/27/22   Luiz Channel, MD  doxazosin  (CARDURA ) 4 MG tablet Take 1 tablet (4 mg total) by mouth 2 (two) times daily. 06/22/22 06/22/23  Lonni Slain, MD  doxycycline  (VIBRAMYCIN ) 100 MG capsule Take  1 capsule (100 mg total) by mouth 2 (two) times daily. Patient not taking: Reported on 05/14/2024 10/30/23   Prosperi, Christian H, PA-C  hydrALAZINE  (APRESOLINE ) 100 MG tablet Take 1 tablet (100 mg total) by mouth 3 (three) times daily. Patient not taking: Reported on 05/14/2024 10/30/23   Prosperi, Christian H, PA-C  isosorbide  mononitrate (IMDUR ) 30 MG 24 hr tablet Take 1 tablet (30 mg total) by mouth daily. Patient not taking: Reported on 05/14/2024 10/30/23   Prosperi, Christian H, PA-C  metFORMIN  (GLUCOPHAGE ) 500 MG tablet Take 1 tablet (500 mg total) by mouth daily. Patient not taking: Reported on 05/14/2024 02/09/23   Dennise Hoes, MD  telmisartan  (MICARDIS ) 20 MG tablet Take 1 tablet (20 mg total) by mouth daily. Patient not taking: Reported on 05/14/2024 10/30/23   Prosperi, Christian H, PA-C  vitamin B-12 (CYANOCOBALAMIN ) 250 MCG tablet Take 250 mcg by mouth daily. Patient not taking: Reported on 05/14/2024    [provider]    I have reviewed the patient's current medications. Scheduled:  amLODipine   10 mg Oral  Daily   furosemide   80 mg Intravenous BID   insulin  aspart  0-6 Units Subcutaneous TID WC   Continuous:  nitroGLYCERIN  60 mcg/min (05/15/24 0229)    Labs:  Results for orders placed or performed during the hospital encounter of 05/14/24 (from the past 48 hours)  CBC with Differential/Platelet     Status: Abnormal   Collection Time: 05/14/24  5:48 AM  Result Value Ref Range   WBC 10.5 4.0 - 10.5 K/uL   RBC 3.69 (L) 4.22 - 5.81 MIL/uL   Hemoglobin 10.0 (L) 13.0 - 17.0 g/dL   HCT 69.4 (L) 60.9 - 47.9 %   MCV 82.7 80.0 - 100.0 fL   MCH 27.1 26.0 - 34.0 pg   MCHC 32.8 30.0 - 36.0 g/dL   RDW 87.5 88.4 - 84.4 %   Platelets 298 150 - 400 K/uL   nRBC 0.0 0.0 - 0.2 %   Neutrophils Relative % 70 %   Neutro Abs 7.5 1.7 - 7.7 K/uL   Lymphocytes Relative 15 %   Lymphs Abs 1.5 0.7 - 4.0 K/uL   Monocytes Relative 3 %   Monocytes Absolute 0.4 0.1 - 1.0 K/uL   Eosinophils Relative 11 %   Eosinophils Absolute 1.1 (H) 0.0 - 0.5 K/uL   Basophils Relative 1 %   Basophils Absolute 0.1 0.0 - 0.1 K/uL   Immature Granulocytes 0 %   Abs Immature Granulocytes 0.03 0.00 - 0.07 K/uL    Comment: Performed at [facility], [address] [place], [place] [address]  Comprehensive metabolic panel with GFR     Status: Abnormal   Collection Time: 05/14/24  5:48 AM  Result Value Ref Range   Sodium 140 135 - 145 mmol/L   Potassium 3.6 3.5 - 5.1 mmol/L   Chloride 101 98 - 111 mmol/L   CO2 21 (L) 22 - 32 mmol/L   Glucose, Bld 105 (H) 70 - 99 mg/dL    Comment: Glucose reference range applies only to samples taken after fasting for at least 8 hours.   BUN 87 (H) 6 - 20 mg/dL   Creatinine, Ser 88.09 (H) 0.61 - 1.24 mg/dL   Calcium 9.0 8.9 - 89.6 mg/dL   Total Protein 6.8 6.5 - 8.1 g/dL   Albumin 4.1 3.5 - 5.0 g/dL   AST 21 15 - 41 U/L   ALT 23 0 - 44 U/L  Alkaline Phosphatase 74 38 - 126 U/L   Total Bilirubin 0.6 0.0 - 1.2 mg/dL   GFR, Estimated 5 (L) >60 mL/min    Comment:  (NOTE) Calculated using the CKD-EPI Creatinine Equation (2021)    Anion gap 18 (H) 5 - 15    Comment: Performed at [facility], [address] [place], [place] [address]  Troponin T, High Sensitivity     Status: Abnormal   Collection Time: 05/14/24  5:48 AM  Result Value Ref Range   Troponin T High Sensitivity 161 (HH) <19 ng/L    Comment: Critical Value, Read Back and verified with RONAL ARMOND OAS RN [phone] 05/14/2024 T. TYSOR (NOTE) Biotin concentrations > 1000 ng/mL falsely decrease TnT results.  Serial cardiac troponin measurements are suggested.  Refer to the Links section for chest pain algorithms and additional  guidance. Performed at [facility], [address] [place], [place] [address]   Pro Brain natriuretic peptide     Status: Abnormal   Collection Time: 05/14/24  5:48 AM  Result Value Ref Range   Pro Brain Natriuretic Peptide 30,686.0 (H) <300.0 pg/mL    Comment: (NOTE) Age Group        Cut-Points    Interpretation  < 50 years     450 pg/mL       NT-proBNP > 450 pg/mL indicates                                ADHF is likely              50 to 75 years  900 pg/mL      NT-proBNP > 900 pg/mL indicates          ADHF is likely  > 75 years      1800 pg/mL     NT-proBNP > 1800 pg/mL indicates          ADHF is likely                           All ages    Results between       Indeterminate. Further clinical             300 and the cut-   information is needed to determine            point for age group   if ADHF is present.                                                             Elecsys proBNP II/ Elecsys proBNP II STAT           Cut-Point                       Interpretation  300 pg/mL                    NT-proBNP <300pg/mL indicates                             ADHF is not likely  Performed at [facility], [address]  [place], [place] [address]   Magnesium     Status: None   Collection Time: 05/14/24  5:48 AM   Result Value Ref Range   Magnesium 2.3 1.7 - 2.4 mg/dL    Comment: Performed at [facility], [address] [place], [place] [address]  Troponin T, High Sensitivity     Status: Abnormal   Collection Time: 05/14/24  7:39 AM  Result Value Ref Range   Troponin T High Sensitivity 138 (HH) <19 ng/L    Comment: Critical Value, Read Back and verified with R. CLEMENT RN AT 827 05/14/24 MB (NOTE) Biotin concentrations > 1000 ng/mL falsely decrease TnT results.  Serial cardiac troponin measurements are suggested.  Refer to the Links section for chest pain algorithms and additional  guidance. Performed at [facility], [address] [place], [place] [address]   Basic metabolic panel     Status: Abnormal   Collection Time: 05/14/24  2:19 PM  Result Value Ref Range   Sodium 138 135 - 145 mmol/L   Potassium 3.6 3.5 - 5.1 mmol/L   Chloride 102 98 - 111 mmol/L   CO2 19 (L) 22 - 32 mmol/L   Glucose, Bld 140 (H) 70 - 99 mg/dL    Comment: Glucose reference range applies only to samples taken after fasting for at least 8 hours.   BUN 94 (H) 6 - 20 mg/dL   Creatinine, Ser 87.99 (H) 0.61 - 1.24 mg/dL   Calcium 8.6 (L) 8.9 - 10.3 mg/dL   GFR, Estimated 5 (L) >60 mL/min    Comment: (NOTE) Calculated using the CKD-EPI Creatinine Equation (2021)    Anion gap 18 (H) 5 - 15    Comment: Performed at [facility], [address] [place], [place] [address]  MRSA Next Gen by PCR, Nasal     Status: None   Collection Time: 05/14/24  8:15 PM   Specimen: Nasal Mucosa; Nasal Swab  Result Value Ref Range   MRSA by PCR Next Gen NOT DETECTED NOT DETECTED    Comment: (NOTE) The GeneXpert MRSA Assay (FDA approved for NASAL specimens only), is one component of a comprehensive MRSA colonization surveillance program. It is not intended to diagnose MRSA infection nor to guide or monitor treatment for MRSA infections. Test performance is not FDA approved in patients less than 25  years old. Performed at [facility], [address] [place], [place] [address]   CBC with Differential/Platelet     Status: Abnormal   Collection Time: 05/15/24 12:12 AM  Result Value Ref Range   WBC 10.0 4.0 - 10.5 K/uL   RBC 3.23 (L) 4.22 - 5.81 MIL/uL   Hemoglobin 8.7 (L) 13.0 - 17.0 g/dL   HCT 72.9 (L) 60.9 - 47.9 %   MCV 83.6 80.0 - 100.0 fL   MCH 26.9 26.0 - 34.0 pg   MCHC 32.2 30.0 - 36.0 g/dL   RDW 87.5 88.4 - 84.4 %   Platelets 254 150 - 400 K/uL   nRBC 0.0 0.0 - 0.2 %   Neutrophils Relative % 72 %   Neutro Abs 7.1 1.7 - 7.7 K/uL   Lymphocytes Relative 12 %   Lymphs Abs 1.2 0.7 - 4.0 K/uL   Monocytes Relative 4 %   Monocytes Absolute 0.4 0.1 - 1.0 K/uL   Eosinophils Relative 12 %   Eosinophils Absolute 1.2 (H) 0.0 - 0.5 K/uL   Basophils Relative 0 %  Basophils Absolute 0.0 0.0 - 0.1 K/uL   Immature Granulocytes 0 %   Abs Immature Granulocytes 0.03 0.00 - 0.07 K/uL    Comment: Performed at [facility], [address] [place], [place] [address]  Comprehensive metabolic panel with GFR     Status: Abnormal   Collection Time: 05/15/24 12:12 AM  Result Value Ref Range   Sodium 136 135 - 145 mmol/L   Potassium 3.9 3.5 - 5.1 mmol/L   Chloride 103 98 - 111 mmol/L   CO2 20 (L) 22 - 32 mmol/L   Glucose, Bld 149 (H) 70 - 99 mg/dL    Comment: Glucose reference range applies only to samples taken after fasting for at least 8 hours.   BUN 95 (H) 6 - 20 mg/dL   Creatinine, Ser 87.92 (H) 0.61 - 1.24 mg/dL   Calcium 8.3 (L) 8.9 - 10.3 mg/dL   Total Protein 5.7 (L) 6.5 - 8.1 g/dL   Albumin 2.9 (L) 3.5 - 5.0 g/dL   AST 16 15 - 41 U/L   ALT 18 0 - 44 U/L   Alkaline Phosphatase 52 38 - 126 U/L   Total Bilirubin 0.5 0.0 - 1.2 mg/dL   GFR, Estimated 5 (L) >60 mL/min    Comment: (NOTE) Calculated using the CKD-EPI Creatinine Equation (2021)    Anion gap 13 5 - 15    Comment: Performed at [facility], [address] [place], [place] [address]  Magnesium      Status: None   Collection Time: 05/15/24 12:12 AM  Result Value Ref Range   Magnesium 2.2 1.7 - 2.4 mg/dL    Comment: Performed at [facility], [address], [place], [place] [address]  Phosphorus     Status: Abnormal   Collection Time: 05/15/24 12:12 AM  Result Value Ref Range   Phosphorus 7.9 (H) 2.5 - 4.6 mg/dL    Comment: Performed at [facility], [address] [place], [place] [address]  ABO/Rh     Status: None   Collection Time: 05/15/24 12:12 AM  Result Value Ref Range   ABO/RH(D)      O POS Performed at [facility], [address] [place], [place] [address]   Protime-INR     Status: Abnormal   Collection Time: 05/15/24  3:14 AM  Result Value Ref Range   Prothrombin Time 16.5 (H) 11.4 - 15.2 seconds   INR 1.3 (H) 0.8 - 1.2    Comment: (NOTE) INR goal varies based on device and disease states. Performed at [facility], [address] [place], [place] [address]   APTT     Status: None   Collection Time: 05/15/24  3:14 AM  Result Value Ref Range   aPTT 33 24 - 36 seconds    Comment: Performed at [facility], [address], [place], [place] [address]  Ferritin     Status: None   Collection Time: 05/15/24  3:14 AM  Result Value Ref Range   Ferritin 145 24 - 336 ng/mL    Comment: Performed at [facility], [address] [place], [place] [address]  Iron  and TIBC     Status: Abnormal   Collection Time: 05/15/24  3:14 AM  Result Value Ref Range   Iron  31 (L) 45 - 182 ug/dL   TIBC 659 749 - 549 ug/dL   Saturation Ratios 9 (L) 17.9 - 39.5 %  UIBC 309 ug/dL    Comment: Performed at [facility], [address] [place], [place] [address]  Type and screen [facility]     Status: None   Collection Time: 05/15/24  3:14 AM  Result Value Ref Range   ABO/RH(D) O POS    Antibody Screen NEG    Sample Expiration      05/18/2024,2359 Performed at [facility], [address], [place], [place]  [address]   Brain natriuretic peptide     Status: Abnormal   Collection Time: 05/15/24  3:14 AM  Result Value Ref Range   B Natriuretic Peptide 1,373.1 (H) 0.0 - 100.0 pg/mL    Comment: Performed at [facility], [address], [place], [place] [address]  Hemoglobin A1c     Status: Abnormal   Collection Time: 05/15/24  3:14 AM  Result Value Ref Range   Hgb A1c MFr Bld 4.3 (L) 4.8 - 5.6 %    Comment: (NOTE) Diagnosis of Diabetes The following HbA1c ranges recommended by the American Diabetes Association (ADA) may be used as an aid in the diagnosis of diabetes mellitus.  Hemoglobin             Suggested A1C NGSP%              Diagnosis  <5.7                   Non Diabetic  5.7-6.4                Pre-Diabetic  >6.4                   Diabetic  <7.0                   Glycemic control for                       adults with diabetes.     Mean Plasma Glucose 76.71 mg/dL    Comment: Performed at [facility], [address], [place], [place] [address]  Urinalysis, Complete w Microscopic -Urine, Clean Catch     Status: Abnormal   Collection Time: 05/15/24  4:13 AM  Result Value Ref Range   Color, Urine STRAW (A) YELLOW   APPearance CLEAR CLEAR   Specific Gravity, Urine 1.008 1.005 - 1.030   pH 6.0 5.0 - 8.0   Glucose, UA 50 (A) NEGATIVE mg/dL   Hgb urine dipstick SMALL (A) NEGATIVE   Bilirubin Urine NEGATIVE NEGATIVE   Ketones, ur NEGATIVE NEGATIVE mg/dL   Protein, ur 899 (A) NEGATIVE mg/dL   Nitrite NEGATIVE NEGATIVE   Leukocytes,Ua NEGATIVE NEGATIVE   RBC / HPF 0-5 0 - 5 RBC/hpf   WBC, UA 0-5 0 - 5 WBC/hpf   Bacteria, UA NONE SEEN NONE SEEN   Squamous Epithelial / HPF 0-5 0 - 5 /HPF    Comment: Performed at [facility], [address] [place], [place] [address]  Sodium, urine, random     Status: None   Collection Time: 05/15/24  4:13 AM  Result Value Ref Range   Sodium, Ur 96 mmol/L    Comment: Performed at [facility], [address]  [place], [place] [address]  Creatinine, urine, random     Status: None   Collection Time: 05/15/24  4:13 AM  Result Value Ref Range   Creatinine, Urine 50 mg/dL  Comment: Performed at [facility], [address], [place], [place] [address]  Rapid urine drug screen (hospital performed)     Status: None   Collection Time: 05/15/24  4:14 AM  Result Value Ref Range   Opiates NONE DETECTED NONE DETECTED   Cocaine NONE DETECTED NONE DETECTED   Benzodiazepines NONE DETECTED NONE DETECTED   Amphetamines NONE DETECTED NONE DETECTED   Tetrahydrocannabinol NONE DETECTED NONE DETECTED   Barbiturates NONE DETECTED NONE DETECTED    Comment: (NOTE) DRUG SCREEN FOR MEDICAL PURPOSES ONLY.  IF CONFIRMATION IS NEEDED FOR ANY PURPOSE, NOTIFY LAB WITHIN 5 DAYS.  LOWEST DETECTABLE LIMITS FOR URINE DRUG SCREEN Drug Class                     Cutoff (ng/mL) Amphetamine and metabolites    1000 Barbiturate and metabolites    200 Benzodiazepine                 200 Opiates and metabolites        300 Cocaine and metabolites        300 THC                            50 Performed at [facility], [address], [place], [place] [address]   Glucose, capillary     Status: None   Collection Time: 05/15/24  6:18 AM  Result Value Ref Range   Glucose-Capillary 89 70 - 99 mg/dL    Comment: Glucose reference range applies only to samples taken after fasting for at least 8 hours.     ROS:  Pertinent items are noted in HPI.  Physical Exam: Vitals:   05/15/24 0753 05/15/24 0809  BP: (!) 157/102 (!) 157/102  Pulse: 95   Resp: 19   Temp: 98.8 F (37.1 C)   SpO2: 98%      General: Laying in bed, no acute distress Neck: No JVD Heart: Tachycardic Lungs: + Minimal crackles at right lower lung base Abdomen: Soft, nontender, nondistended Extremities: No lower extremity edema bilaterally Skin: Warm, well perfused Neuro: Awake, alert, answering questions appropriately, no focal  deficits    Toma Edwards PGY 2  05/15/2024, 10:24 AM

## 2024-05-15 NOTE — Hospital Course (Signed)
 Bobby Stevens was admitted to the hospital with the working diagnosis of heart failure decompensation in the setting of progressive renal disease.   53 yo male with the past medical history of CKD stage 4 to 5, hypertension, T2DM, and heart failure who presented with dyspnea. Reported 2 weeks of progressive dyspnea, associated with worsening lower extremity edema. Apparently he has not been adherent to his medications, he stopped taking blood pressure medications 6 months prior to admission.  On his initial physical examination his blood pressure was 238/159, 162/109, HR 85, 02 saturation 95% Lungs with no wheezing or rhonchi, heart with S1 and S2 present and regular with no gallops, abdomen with no distention and positive bilateral lower extremity edema.

## 2024-05-16 ENCOUNTER — Inpatient Hospital Stay (HOSPITAL_COMMUNITY)

## 2024-05-16 DIAGNOSIS — I5033 Acute on chronic diastolic (congestive) heart failure: Secondary | ICD-10-CM | POA: Diagnosis not present

## 2024-05-16 LAB — CBC
HCT: 28.8 % — ABNORMAL LOW (ref 39.0–52.0)
Hemoglobin: 9.4 g/dL — ABNORMAL LOW (ref 13.0–17.0)
MCH: 27 pg (ref 26.0–34.0)
MCHC: 32.6 g/dL (ref 30.0–36.0)
MCV: 82.8 fL (ref 80.0–100.0)
Platelets: 289 K/uL (ref 150–400)
RBC: 3.48 MIL/uL — ABNORMAL LOW (ref 4.22–5.81)
RDW: 12.2 % (ref 11.5–15.5)
WBC: 11.5 K/uL — ABNORMAL HIGH (ref 4.0–10.5)
nRBC: 0 % (ref 0.0–0.2)

## 2024-05-16 LAB — GLUCOSE, CAPILLARY
Glucose-Capillary: 104 mg/dL — ABNORMAL HIGH (ref 70–99)
Glucose-Capillary: 252 mg/dL — ABNORMAL HIGH (ref 70–99)
Glucose-Capillary: 94 mg/dL (ref 70–99)
Glucose-Capillary: 155 mg/dL — ABNORMAL HIGH (ref 70–99)

## 2024-05-16 LAB — BASIC METABOLIC PANEL WITH GFR
Anion gap: 16 — ABNORMAL HIGH (ref 5–15)
BUN: 93 mg/dL — ABNORMAL HIGH (ref 6–20)
CO2: 20 mmol/L — ABNORMAL LOW (ref 22–32)
Calcium: 8.2 mg/dL — ABNORMAL LOW (ref 8.9–10.3)
Chloride: 103 mmol/L (ref 98–111)
Creatinine, Ser: 12.88 mg/dL — ABNORMAL HIGH (ref 0.61–1.24)
GFR, Estimated: 4 mL/min — ABNORMAL LOW (ref >60)
Glucose, Bld: 95 mg/dL (ref 70–99)
Potassium: 3.6 mmol/L (ref 3.5–5.1)
Sodium: 139 mmol/L (ref 135–145)

## 2024-05-16 LAB — HEPATITIS B SURFACE ANTIBODY, QUANTITATIVE: Hep B S AB Quant (Post): <3.5 m[IU]/mL — ABNORMAL LOW

## 2024-05-16 MED ORDER — VANCOMYCIN HCL IN DEXTROSE 1-5 GM/200ML-% IV SOLN
INTRAVENOUS | Status: AC
  Filled 2024-05-16: qty 200

## 2024-05-16 MED ORDER — PENTAFLUOROPROP-TETRAFLUOROETH EX AERO: 1.0000 | INHALATION_SPRAY | CUTANEOUS | Status: DC | PRN

## 2024-05-16 MED ORDER — ALTEPLASE 2 MG IJ SOLR: 2.0000 mg | Freq: Once | INTRAMUSCULAR | Status: DC | PRN

## 2024-05-16 MED ORDER — ANTICOAGULANT SODIUM CITRATE 4% (200MG/5ML) IV SOLN: 5.0000 mL | Status: DC | PRN

## 2024-05-16 MED ORDER — OXYCODONE HCL 5 MG PO TABS
5.0000 mg | ORAL_TABLET | ORAL | Status: DC | PRN
  Administered 2024-05-16 (×2): 5 mg via ORAL
  Filled 2024-05-16 (×2): qty 1

## 2024-05-16 MED ORDER — HEPARIN SODIUM (PORCINE) 1000 UNIT/ML IJ SOLN
INTRAMUSCULAR | Status: AC
  Filled 2024-05-16: qty 10

## 2024-05-16 MED ORDER — LIDOCAINE HCL (PF) 1 % IJ SOLN: 5.0000 mL | INTRAMUSCULAR | Status: DC | PRN

## 2024-05-16 MED ORDER — FENTANYL CITRATE (PF) 100 MCG/2ML IJ SOLN
INTRAMUSCULAR | Status: AC | PRN
  Administered 2024-05-16: 50 ug via INTRAVENOUS

## 2024-05-16 MED ORDER — LIDOCAINE-EPINEPHRINE 1 %-1:100000 IJ SOLN
INTRAMUSCULAR | Status: AC
  Filled 2024-05-16: qty 1

## 2024-05-16 MED ORDER — HEPARIN SODIUM (PORCINE) 1000 UNIT/ML IJ SOLN
INTRAMUSCULAR | Status: AC
Start: 2024-05-16 — End: 2024-05-16
  Filled 2024-05-16: qty 4

## 2024-05-16 MED ORDER — HEPARIN SODIUM (PORCINE) 1000 UNIT/ML DIALYSIS: 1000.0000 [IU] | INTRAMUSCULAR | Status: DC | PRN

## 2024-05-16 MED ORDER — MIDAZOLAM HCL 2 MG/2ML IJ SOLN
INTRAMUSCULAR | Status: AC
  Filled 2024-05-16: qty 2

## 2024-05-16 MED ORDER — FENTANYL CITRATE (PF) 100 MCG/2ML IJ SOLN
INTRAMUSCULAR | Status: AC
  Filled 2024-05-16: qty 2

## 2024-05-16 MED ORDER — VANCOMYCIN HCL 500 MG/100ML IV SOLN
INTRAVENOUS | Status: AC | PRN
  Administered 2024-05-16: 1000 mg via INTRAVENOUS

## 2024-05-16 MED ORDER — LIDOCAINE-PRILOCAINE 2.5-2.5 % EX CREA
1.0000 | TOPICAL_CREAM | CUTANEOUS | Status: DC | PRN
Start: 2024-05-16 — End: 2024-05-16

## 2024-05-16 MED ORDER — MIDAZOLAM HCL 2 MG/2ML IJ SOLN
INTRAMUSCULAR | Status: AC | PRN
  Administered 2024-05-16: 1 mg via INTRAVENOUS

## 2024-05-16 MED ORDER — HYDRALAZINE HCL 20 MG/ML IJ SOLN
INTRAMUSCULAR | Status: AC
  Filled 2024-05-16: qty 2

## 2024-05-16 MED ORDER — LIDOCAINE-EPINEPHRINE 1 %-1:100000 IJ SOLN
20.0000 mL | Freq: Once | INTRAMUSCULAR | Status: AC
  Administered 2024-05-16: 20 mL via INTRADERMAL

## 2024-05-16 MED ORDER — HYDRALAZINE HCL 20 MG/ML IJ SOLN
25.0000 mg | Freq: Once | INTRAMUSCULAR | Status: AC
  Administered 2024-05-16: 25 mg via INTRAVENOUS

## 2024-05-16 NOTE — Progress Notes (Signed)
 Heart Failure Navigator Progress Note  Assessed for Heart & Vascular TOC clinic readiness.  Patient does not meet criteria due to CKD 4-5, plan to start on hemodialysis. Current Creatinine 12.88, No HF TOC.   Navigator will sign off at this time.   Stephane Haddock, BSN, Scientist, clinical (histocompatibility and immunogenetics) Only

## 2024-05-16 NOTE — Progress Notes (Addendum)
 OP HD referral has been submitted to [facility] admissions for review. Will await determination and assist as needed.   Bobby Stevens Dialysis Navigator [phone]  Addendum 2:36 Pt accepted at [facility] MWF 12:15 chair time, would need to be there 1st appointment 11:45. Although pt did not get accepted into is preferred location, when spoken to at bedside, he was agreeable to this. Start date would be Monday, August 4th. AVS completed.

## 2024-05-16 NOTE — Progress Notes (Signed)
 Nutrition Education Note  RD consulted for Renal Education. Pt at first dialysis tx at time of attempted education session. Attached renal diet education to AVS, which reviews food groups and provides written recommended serving sizes.   Education explains why diet restrictions are needed and provided lists of foods to limit/avoid that are high potassium, sodium, and phosphorus. Provided specific recommendations on safer alternatives of these foods. Strongly encouraged compliance of this diet.   It discusses importance of protein intake at each meal and snack. Provided examples of how to maximize protein intake throughout the day. Discusses need for fluid restriction with dialysis, importance of minimizing weight gain between HD treatments, and renal-friendly beverage options.  Compliance questionable as unable to meet with patient in person.   Body mass index is 23.27 kg/m. Pt meets criteria for within normative standards for age based on current BMI.  Current diet order is renal w/ fluid restriction, patient is consuming approximately 100% of meals at this time. Labs and medications reviewed. No further nutrition interventions warranted at this time. RD contact information listed. If additional nutrition issues arise, please re-consult RD.  Blair Deaner MS, RD, LDN Registered Dietitian Clinical Nutrition RD Inpatient Contact Info in Amion

## 2024-05-16 NOTE — Procedures (Signed)
 Interventional Radiology Procedure Note  Procedure: Tunneled hemodialysis catheter placement   Findings: Please refer to procedural dictation for full description. 23 cm, right internal jugular.  Complications: None immediate  Estimated Blood Loss: < 5 ml  Recommendations: Catheter ready for immediate use.   Ester Sides, MD

## 2024-05-16 NOTE — Plan of Care (Signed)

## 2024-05-16 NOTE — Progress Notes (Signed)
 PROGRESS NOTE    [name]  FMW:[id number] DOB: Nov 06, 1970 DOA: 05/14/2024 PCP: Celestia Rosaline SQUIBB, NP   Brief Narrative:   53 yo male with the past medical history of CKD stage 4 to 5, hypertension, T2DM, and heart failure who presented with dyspnea. Reported 2 weeks of progressive dyspnea, associated with worsening lower extremity edema. Apparently he has not been adherent to his medications, he stopped taking blood pressure medications 6 months prior to admission. Being managed for acute on chronic diastolic CHF and progressive CKD 5, now started on HD.  Assessment & Plan:  Principal Problem:   Acute on chronic diastolic CHF (congestive heart failure) (HCC) Active Problems:   Chronic kidney disease, stage 5 (HCC)   Essential hypertension   DM2 (diabetes mellitus, type 2) (HCC)    Acute on chronic systolic CHF: NYHA class III  Continue furosemide  for diuresis and blood pressure control with amlodipine  and hydralazine .  ECHO showed EF of 30-35% with Grade II diastolic dysfunction. Limited GDMT in the setting of worsening renal function.   Chronic kidney disease, stage 5 with anemia of chronic disease and renal osteodystrophy  Progressive renal disease to ESRD.  S/p Right internal jugular  TDC placement by IR on 7/31 First session of HD will be done today.     Anemia of chronic renal disease with iron  deficiency.  On iron  replacement.    Essential hypertension Continue with amlodipine , lasix  and hydralazine . Off of nitroglycerin  drip   DM2 (diabetes mellitus, type 2)  complicated by diabetic nephropathy,POA:  Continue glucose cover and monitoring with insulin  sliding scale.   Disposition: Home. He will need out patient HD set up.  DVT prophylaxis: SCDs Start: 05/14/24 2054     Code Status: Full Code Family Communication:  None at the bedside Status is: Inpatient Remains inpatient appropriate because: New dialysis  Subjective:  Going for [facility] placement  today by IR. Will be hemodialyzed afterwards.  Examination:  General exam: Appears calm and comfortable  Respiratory system: Clear to auscultation. Respiratory effort normal. Cardiovascular system: S1 & S2 heard, RRR. No JVD, murmurs, rubs, gallops or clicks. trace pedal edema. Gastrointestinal system: Abdomen is nondistended, soft and nontender. No organomegaly or masses felt. Normal bowel sounds heard. Central nervous system: Alert and oriented. No focal neurological deficits. Extremities: Symmetric 5 x 5 power. Skin: No rashes, lesions or ulcers Psychiatry: Judgement and insight appear normal. Mood & affect appropriate.    Diet Orders (From admission, onward)     Start     Ordered   05/16/24 1030  Diet renal with fluid restriction Fluid restriction: 1200 mL Fluid; Room service appropriate? Yes; Fluid consistency: Thin  Diet effective now       Question Answer Comment  Fluid restriction: 1200 mL Fluid   Room service appropriate? Yes   Fluid consistency: Thin      05/16/24 1029            Objective: Vitals:   05/16/24 0850 05/16/24 0855 05/16/24 0908 05/16/24 1046  BP: (!) 184/94 (!) 151/67 131/71 131/71  Pulse: (!) 115 (!) 106    Resp: 18 (!) 24 19   Temp:   98 F (36.7 C)   TempSrc:   Oral   SpO2: 100% 100%    Weight:      Height:        Intake/Output Summary (Last 24 hours) at 05/16/2024 1117 Last data filed at 05/16/2024 0417 Gross per 24 hour  Intake 977.03 ml  Output 3350  ml  Net -2372.97 ml   Filed Weights   05/14/24 0539 05/14/24 2014 05/15/24 0615  Weight: 81.6 kg 79.5 kg 78.6 kg    Scheduled Meds:  amLODipine   10 mg Oral Daily   Chlorhexidine  Gluconate Cloth  6 each Topical Q0600   furosemide   80 mg Intravenous BID   hydrALAZINE   50 mg Oral Q8H   insulin  aspart  0-6 Units Subcutaneous TID WC   Continuous Infusions:  iron  sucrose Stopped (05/15/24 1324)   vancomycin       Nutritional status     Body mass index is 22.86 kg/m.  Data  Reviewed:   CBC: Recent Labs  Lab 05/14/24 0548 05/15/24 0012 05/16/24 0411  WBC 10.5 10.0 11.5*  NEUTROABS 7.5 7.1  --   HGB 10.0* 8.7* 9.4*  HCT 30.5* 27.0* 28.8*  MCV 82.7 83.6 82.8  PLT 298 254 289   Basic Metabolic Panel: Recent Labs  Lab 05/14/24 0548 05/14/24 1419 05/15/24 0012 05/15/24 1454 05/15/24 1455 05/16/24 0411  NA 140 138 136  --  137 139  K 3.6 3.6 3.9  --  3.8 3.6  CL 101 102 103  --  104 103  CO2 21* 19* 20*  --  20* 20*  GLUCOSE 105* 140* 149*  --  119* 95  BUN 87* 94* 95*  --  95* 93*  CREATININE 11.90* 12.00* 12.07*  --  12.49* 12.88*  CALCIUM 9.0 8.6* 8.3*  --  8.2* 8.2*  MG 2.3  --  2.2  --   --   --   PHOS  --   --  7.9* 7.9* 7.9*  --    GFR: Estimated Creatinine Clearance: 7.4 mL/min (A) (by C-G formula based on SCr of 12.88 mg/dL (H)). Liver Function Tests: Recent Labs  Lab 05/14/24 0548 05/15/24 0012 05/15/24 1455  AST 21 16  --   ALT 23 18  --   ALKPHOS 74 52  --   BILITOT 0.6 0.5  --   PROT 6.8 5.7*  --   ALBUMIN 4.1 2.9* 2.8*   No results for input(s): LIPASE, AMYLASE in the last 168 hours. No results for input(s): AMMONIA in the last 168 hours. Coagulation Profile: Recent Labs  Lab 05/15/24 0314  INR 1.3*   Cardiac Enzymes: No results for input(s): CKTOTAL, CKMB, CKMBINDEX, TROPONINI in the last 168 hours. BNP (last 3 results) Recent Labs    05/14/24 0548  PROBNP 30,686.0*   HbA1C: Recent Labs    05/15/24 0314  HGBA1C 4.3*   CBG: Recent Labs  Lab 05/15/24 0618 05/15/24 1118 05/15/24 1537 05/15/24 2051 05/16/24 0601  GLUCAP 89 154* 119* 134* 104*   Lipid Profile: No results for input(s): CHOL, HDL, LDLCALC, TRIG, CHOLHDL, LDLDIRECT in the last 72 hours. Thyroid Function Tests: No results for input(s): TSH, T4TOTAL, FREET4, T3FREE, THYROIDAB in the last 72 hours. Anemia Panel: Recent Labs    05/15/24 0314  FERRITIN 145  TIBC 340  IRON  31*   Sepsis Labs: No  results for input(s): PROCALCITON, LATICACIDVEN in the last 168 hours.  Recent Results (from the past 240 hours)  MRSA Next Gen by PCR, Nasal     Status: None   Collection Time: 05/14/24  8:15 PM   Specimen: Nasal Mucosa; Nasal Swab  Result Value Ref Range Status   MRSA by PCR Next Gen NOT DETECTED NOT DETECTED Final    Comment: (NOTE) The GeneXpert MRSA Assay (FDA approved for NASAL specimens only), is one component of  a comprehensive MRSA colonization surveillance program. It is not intended to diagnose MRSA infection nor to guide or monitor treatment for MRSA infections. Test performance is not FDA approved in patients less than 2 years old. Performed at [facility], [address], [place], [place] [address]          Radiology Studies: VAS US  UPPER EXT VEIN MAPPING (PRE-OP  AVF) Result Date: 05/15/2024 UPPER EXTREMITY VEIN MAPPING Patient Name:  REGINOLD BEALE  Date of Exam:   05/15/2024 Medical Rec #: [id number]          Accession #:    [id number] Date of Birth: [birth year]         Patient Gender: M Patient Age:   89 years Exam Location:  [facility] Procedure:      VAS US  UPPER EXT VEIN MAPPING (PRE-OP  AVF) Referring Phys: DONNICE SENDER --------------------------------------------------------------------------------  Indications: Pre-access. History: Type 2 diabetes melitus, CHF, HTN, and ESRD.  Performing Technologist: Ricka Sturdivant-Jones RDMS, RVT  Examination Guidelines: A complete evaluation includes B-mode imaging, spectral Doppler, color Doppler, and power Doppler as needed of all accessible portions of each vessel. Bilateral testing is considered an integral part of a complete examination. Limited examinations for reoccurring indications may be performed as noted. +-----------------+-------------+----------+---------+ Right Cephalic   Diameter (cm)Depth (cm)Findings  +-----------------+-------------+----------+---------+ Shoulder             0.37         0.67             +-----------------+-------------+----------+---------+ Prox upper arm       0.21        0.43             +-----------------+-------------+----------+---------+ Mid upper arm        0.29        0.27             +-----------------+-------------+----------+---------+ Dist upper arm       0.31        0.25             +-----------------+-------------+----------+---------+ Antecubital fossa    0.43        0.32             +-----------------+-------------+----------+---------+ Prox forearm         0.28        0.45             +-----------------+-------------+----------+---------+ Mid forearm          0.23        0.30   branching +-----------------+-------------+----------+---------+ Dist forearm         0.26        0.33             +-----------------+-------------+----------+---------+ Wrist                0.26        0.23             +-----------------+-------------+----------+---------+ +-----------------+-------------+----------+---------+ Right Basilic    Diameter (cm)Depth (cm)Findings  +-----------------+-------------+----------+---------+ Prox upper arm       0.78        0.64             +-----------------+-------------+----------+---------+ Mid upper arm        0.59        1.01             +-----------------+-------------+----------+---------+ Dist upper arm       0.68  0.94             +-----------------+-------------+----------+---------+ Antecubital fossa    0.70        0.75   branching +-----------------+-------------+----------+---------+ Prox forearm         0.23        0.42             +-----------------+-------------+----------+---------+ Mid forearm          0.17        0.26             +-----------------+-------------+----------+---------+ Distal forearm       0.18        0.23             +-----------------+-------------+----------+---------+ Wrist                0.17        0.21              +-----------------+-------------+----------+---------+ +-----------------+-------------+----------+--------+ Left Cephalic    Diameter (cm)Depth (cm)Findings +-----------------+-------------+----------+--------+ Shoulder             0.19        0.62            +-----------------+-------------+----------+--------+ Prox upper arm       0.17        0.41            +-----------------+-------------+----------+--------+ Mid upper arm        0.17        0.38            +-----------------+-------------+----------+--------+ Dist upper arm       0.16        0.27            +-----------------+-------------+----------+--------+ Antecubital fossa    0.23        0.29            +-----------------+-------------+----------+--------+ Prox forearm         0.24        0.31            +-----------------+-------------+----------+--------+ Mid forearm          0.20        0.30            +-----------------+-------------+----------+--------+ Dist forearm         0.18        0.25            +-----------------+-------------+----------+--------+ Wrist                0.15        0.18            +-----------------+-------------+----------+--------+ +-----------------+-------------+----------+--------------+ Left Basilic     Diameter (cm)Depth (cm)   Findings    +-----------------+-------------+----------+--------------+ Prox upper arm       0.64        1.31                  +-----------------+-------------+----------+--------------+ Mid upper arm        0.57        1.36                  +-----------------+-------------+----------+--------------+ Dist upper arm       0.41        1.41                  +-----------------+-------------+----------+--------------+ Antecubital fossa    0.41        0.71  Thrombus    +-----------------+-------------+----------+--------------+ Prox forearm         0.14        0.45                   +-----------------+-------------+----------+--------------+ Mid forearm                             not visualized +-----------------+-------------+----------+--------------+ Distal forearm                          not visualized +-----------------+-------------+----------+--------------+ Wrist                0.17        0.26                  +-----------------+-------------+----------+--------------+ *See table(s) above for measurements and observations.  Diagnosing physician: Debby Robertson Electronically signed by Debby Robertson on 05/15/2024 at 9:16:59 PM.    Final    ECHOCARDIOGRAM COMPLETE Result Date: 05/15/2024    ECHOCARDIOGRAM REPORT   Patient Name:   Bobby Stevens Date of Exam: 05/15/2024 Medical Rec #:  [id number]         Height:       73.0 in Accession #:    [id number]        Weight:       173.3 lb Date of Birth:  May 17, 1971        BSA:          2.024 m Patient Age:    52 years          BP:           164/106 mmHg Patient Gender: M                 HR:           94 bpm. Exam Location:  Inpatient Procedure: 2D Echo, Cardiac Doppler, Color Doppler and Strain Analysis (Both            Spectral and Color Flow Doppler were utilized during procedure). Indications:     CHF  History:         Patient has prior history of Echocardiogram examinations, most                  recent 03/31/2021. Cardiomyopathy; Risk Factors:CKD.  Sonographer:     Therisa Crouch Referring Phys:  [id number] EVA KATHEE PORE Diagnosing Phys: Morene Brownie IMPRESSIONS  1. Left ventricular ejection fraction, by estimation, is 30 to 35%. The left ventricle has moderately decreased function. The left ventricle demonstrates global hypokinesis. Left ventricular diastolic parameters are consistent with Grade II diastolic dysfunction (pseudonormalization). The average left ventricular global longitudinal strain is -14.4 %. The global longitudinal strain is abnormal.  2. Right ventricular systolic function is normal. The right  ventricular size is mildly enlarged.  3. Left atrial size was moderately dilated.  4. The mitral valve is normal in structure. Mild mitral valve regurgitation. No evidence of mitral stenosis.  5. The aortic valve is normal in structure. Aortic valve regurgitation is not visualized. No aortic stenosis is present.  6. Aortic dilatation noted. There is borderline dilatation of the ascending aorta, measuring 40 mm.  7. The inferior vena cava is normal in size with greater than 50% respiratory variability, suggesting right atrial pressure of 3 mmHg. Comparison(s): Changes from prior study are noted. LV function significantly worse. FINDINGS  Left Ventricle:  Left ventricular ejection fraction, by estimation, is 30 to 35%. The left ventricle has moderately decreased function. The left ventricle demonstrates global hypokinesis. The average left ventricular global longitudinal strain is -14.4 %. Strain was performed and the global longitudinal strain is abnormal. The left ventricular internal cavity size was normal in size. There is no left ventricular hypertrophy. Left ventricular diastolic parameters are consistent with Grade II diastolic dysfunction (pseudonormalization). Right Ventricle: The right ventricular size is mildly enlarged. No increase in right ventricular wall thickness. Right ventricular systolic function is normal. Left Atrium: Left atrial size was moderately dilated. Right Atrium: Right atrial size was normal in size. Pericardium: There is no evidence of pericardial effusion. Mitral Valve: The mitral valve is normal in structure. Mild mitral valve regurgitation. No evidence of mitral valve stenosis. Tricuspid Valve: The tricuspid valve is normal in structure. Tricuspid valve regurgitation is mild . No evidence of tricuspid stenosis. Aortic Valve: The aortic valve is normal in structure. Aortic valve regurgitation is not visualized. No aortic stenosis is present. Pulmonic Valve: The pulmonic valve was normal  in structure. Pulmonic valve regurgitation is mild. No evidence of pulmonic stenosis. Aorta: The aortic root is normal in size and structure and aortic dilatation noted. There is borderline dilatation of the ascending aorta, measuring 40 mm. Venous: The inferior vena cava is normal in size with greater than 50% respiratory variability, suggesting right atrial pressure of 3 mmHg. IAS/Shunts: No atrial level shunt detected by color flow Doppler.  LEFT VENTRICLE PLAX 2D LVIDd:         5.60 cm      Diastology LVIDs:         4.70 cm      LV e' medial:    5.03 cm/s LV PW:         1.20 cm      LV E/e' medial:  20.5 LV IVS:        1.20 cm      LV e' lateral:   5.19 cm/s LVOT diam:     2.20 cm      LV E/e' lateral: 19.8 LVOT Area:     3.80 cm                             2D Longitudinal Strain                             2D Strain GLS Avg:     -14.4 % LV Volumes (MOD) LV vol d, MOD A2C: 190.0 ml LV vol d, MOD A4C: 153.0 ml LV vol s, MOD A2C: 109.0 ml LV vol s, MOD A4C: 89.0 ml LV SV MOD A2C:     81.0 ml LV SV MOD A4C:     153.0 ml LV SV MOD BP:      77.5 ml RIGHT VENTRICLE             IVC RV Basal diam:  4.40 cm     IVC diam: 2.30 cm RV S prime:     12.90 cm/s TAPSE (M-mode): 2.1 cm LEFT ATRIUM              Index        RIGHT ATRIUM           Index LA diam:        4.40 cm  2.17 cm/m   RA Area:  19.90 cm LA Vol (A2C):   77.3 ml  38.18 ml/m  RA Volume:   62.10 ml  30.68 ml/m LA Vol (A4C):   103.0 ml 50.88 ml/m LA Biplane Vol: 92.2 ml  45.54 ml/m   AORTA Ao Root diam: 4.00 cm Ao Asc diam:  4.00 cm MITRAL VALVE                  TRICUSPID VALVE MV Area (PHT): 7.66 cm       TR Peak grad:   28.7 mmHg MV Decel Time: 99 msec        TR Vmax:        268.00 cm/s MR Peak grad:    127.2 mmHg MR Mean grad:    89.0 mmHg    SHUNTS MR Vmax:         564.00 cm/s  Systemic Diam: 2.20 cm MR Vmean:        453.0 cm/s MR PISA:         1.01 cm MR PISA Eff ROA: 5 mm MR PISA Radius:  0.40 cm MV E velocity: 103.00 cm/s MV A velocity: 54.00  cm/s MV E/A ratio:  1.91 Morene Brownie Electronically signed by Morene Brownie Signature Date/Time: 05/15/2024/6:51:57 PM    Final (Updated)    US  RENAL Result Date: 05/15/2024 CLINICAL DATA:  Acute renal failure EXAM: RENAL / URINARY TRACT ULTRASOUND COMPLETE COMPARISON:  None Available. FINDINGS: Right Kidney: Renal measurements: 9.7 x 3.9 x 4.2 cm = volume: 82 mL. Renal cortical echogenicity is diffusely increased in keeping with changes of underlying medical renal disease. Cortical thickness is preserved. No intrarenal masses or calcifications. No hydronephrosis. No perinephric fluid collections are seen. Left Kidney: Renal measurements: 10.1 x 4.1 x 3.4 cm = volume: 73 mL. Renal cortical echogenicity is diffusely increased in keeping with changes of underlying medical renal disease. Cortical thickness is preserved. No intrarenal masses or calcifications. No hydronephrosis. No perinephric fluid collections are seen. 2.3 cm simple cyst is seen within the interpolar region of the left kidney for which no follow-up imaging is recommended. Bladder: Appears normal for degree of bladder distention. Other: Moderate left pleural effusion. IMPRESSION: 1. Diffusely increased renal cortical echogenicity in keeping with changes of underlying medical renal disease. No hydronephrosis. 2. Moderate left pleural effusion. Electronically Signed   By: Dorethia Molt M.D.   On: 05/15/2024 01:58      LOS: 2 days   Time spent= 42 mins    Deliliah Room, MD [facility]  If 7PM-7AM, please contact night-coverage  05/16/2024, 11:17 AM

## 2024-05-16 NOTE — Progress Notes (Addendum)
 Assessment/Plan:  Acute Renal Failure, progressing to ESRD  Prior CKD stage V, eGFR 14  Presented to ED with Scr 11.90, baseline Scr 4.59 noted 10/2023; labs pertinent for BUN 87. UA showed small blood and proteinuria. FeNa 17% indicating intrinsic cause, post IV lasix  80 mg. RUQ noted diffusely increased renal cortical echogenicity, no hydronephrosis.  No obstruction noted.  Patient stopped taking all of his medications about 6 months ago, etiology likely in the setting of chronic uncontrolled hypertension and volume overload resulting in acute renal failure. Unfortunately he has just progressed to ESRD; he was told a while ago that he had very advanced kidney disease which will need dialysis in the near future. Most conversations scared the patient away but he understands the need for dialysis at this current time.   - Appreciate RIJ TC by VIR on 7/31   - Will plan on HD session today #1 and #2 tomorrow. - Renally dose medications  - Strict Is and Os and daily weights - Can be referred for Renal transplant at [facility] once patient establishes at a dialysis unit - Will anticipate placement of AV fistula on the R arm in the future; likely will need to be outpt but will check with [facility].  - PLEASE RESTRICT LEFT ARM for future fistula. BP, IV and Labs on the R arm.    Iron  deficiency Anemia Anemia of chronic kidney disease  Presented with Hgb 9.4. Iron  studies indicating low saturation ratios 9 and iron  31.   - loading  w/ IV iron  replacement therapy -> venofer  200mg  daily (started 7/30)   Renal osteodystrophy Check phosphorus, 25 vitamin D  and PTH level   Hypertensive Crisis  Resistant HTN/hypertensive cardiomyopathy  Patient stopped taking all medications about 6 months ago. Currently on amlodipine  10 mg, UF with dialysis will help as well.    HFrecEF Exacerbation Elevated troponins  Moderate left pleural effusion BNP 1,373 with symptoms of DOE and volume overload.  Chest x-ray noted mild  diffuse increase in interstitial markings, edema.  ECHO pending. - manage by primary  Subjective:    Patient is evaluated at bedside, no apparent distress.  Denies any chest pain, shortness of breath, abdominal pain.  Denies any nausea or vomiting.  Reports that he had placement of the [facility] this morning.  Tolerated well.  Objective Vital signs in last 24 hours: Vitals:   05/16/24 0850 05/16/24 0855 05/16/24 0908 05/16/24 1046  BP: (!) 184/94 (!) 151/67 131/71 131/71  Pulse: (!) 115 (!) 106    Resp: 18 (!) 24 19   Temp:   98 F (36.7 C)   TempSrc:   Oral   SpO2: 100% 100%    Weight:      Height:       Weight change:   Intake/Output Summary (Last 24 hours) at 05/16/2024 1118 Last data filed at 05/16/2024 0417 Gross per 24 hour  Intake 977.03 ml  Output 3350 ml  Net -2372.97 ml    Pt is a 53 y.o. yo male who was admitted on 05/14/2024 with acute on chronic heart failure exacerbation and acute renal failure. Bobby Stevens is a 53 y.o. male with past medical history significant for CKD stage IV/V, HTN, chronic diastolic heart failure, type 2 diabetes mellitus who presented to med [facility] ED for [facility] HF exacerbation and acute renal failure, transferred to [facility] 05/14/2024. Patient reports symptom onset about 2 weeks ago, reports symptoms of shortness of breath, dyspnea on exertion, orthopnea, PND,  lower extremity swelling, decrease in appetite, metallic taste in the back of the mouth.  Denies any nausea, vomiting, abdominal pain.  Denies any chest pain.  No fevers or headaches.  Patient reports that he was taking naproxen 2-3 times a week for not feeling well.  Patient reports that he stopped taking all of his medications about 6 months ago, reports that the medications he was taking caused him to feel nauseous.  So he decided to stop taking all of his medications.  Patient reports that he is aware that he has underlying chronic kidney disease, reports that he used to  follow Dr. Dennise at the [facility], does not know the last time he followed. Patient denies any family members on dialysis    Labs: Basic Metabolic Panel: Recent Labs  Lab 05/15/24 0012 05/15/24 1454 05/15/24 1455 05/16/24 0411  NA 136  --  137 139  K 3.9  --  3.8 3.6  CL 103  --  104 103  CO2 20*  --  20* 20*  GLUCOSE 149*  --  119* 95  BUN 95*  --  95* 93*  CREATININE 12.07*  --  12.49* 12.88*  CALCIUM 8.3*  --  8.2* 8.2*  PHOS 7.9* 7.9* 7.9*  --    Liver Function Tests: Recent Labs  Lab 05/14/24 0548 05/15/24 0012 05/15/24 1455  AST 21 16  --   ALT 23 18  --   ALKPHOS 74 52  --   BILITOT 0.6 0.5  --   PROT 6.8 5.7*  --   ALBUMIN 4.1 2.9* 2.8*   No results for input(s): LIPASE, AMYLASE in the last 168 hours. No results for input(s): AMMONIA in the last 168 hours. CBC: Recent Labs  Lab 05/14/24 0548 05/15/24 0012 05/16/24 0411  WBC 10.5 10.0 11.5*  NEUTROABS 7.5 7.1  --   HGB 10.0* 8.7* 9.4*  HCT 30.5* 27.0* 28.8*  MCV 82.7 83.6 82.8  PLT 298 254 289   Cardiac Enzymes: No results for input(s): CKTOTAL, CKMB, CKMBINDEX, TROPONINI in the last 168 hours. CBG: Recent Labs  Lab 05/15/24 0618 05/15/24 1118 05/15/24 1537 05/15/24 2051 05/16/24 0601  GLUCAP 89 154* 119* 134* 104*    Iron  Studies:  Recent Labs    05/15/24 0314  IRON  31*  TIBC 340  FERRITIN 145   Studies/Results: VAS US  UPPER EXT VEIN MAPPING (PRE-OP  AVF) Result Date: 05/15/2024 UPPER EXTREMITY VEIN MAPPING Patient Name:  Bobby Stevens  Date of Exam:   05/15/2024 Medical Rec #: [id number]          Accession #:    [id number] Date of Birth: Jan 20, 1971         Patient Gender: M Patient Age:   83 years Exam Location:  [facility] Procedure:      VAS US  UPPER EXT VEIN MAPPING (PRE-OP  AVF) Referring Phys: DONNICE SENDER --------------------------------------------------------------------------------  Indications: Pre-access. History: Type 2 diabetes  melitus, CHF, HTN, and ESRD.  Performing Technologist: Ricka Sturdivant-Jones RDMS, RVT  Examination Guidelines: A complete evaluation includes B-mode imaging, spectral Doppler, color Doppler, and power Doppler as needed of all accessible portions of each vessel. Bilateral testing is considered an integral part of a complete examination. Limited examinations for reoccurring indications may be performed as noted. +-----------------+-------------+----------+---------+ Right Cephalic   Diameter (cm)Depth (cm)Findings  +-----------------+-------------+----------+---------+ Shoulder             0.37        0.67             +-----------------+-------------+----------+---------+  Prox upper arm       0.21        0.43             +-----------------+-------------+----------+---------+ Mid upper arm        0.29        0.27             +-----------------+-------------+----------+---------+ Dist upper arm       0.31        0.25             +-----------------+-------------+----------+---------+ Antecubital fossa    0.43        0.32             +-----------------+-------------+----------+---------+ Prox forearm         0.28        0.45             +-----------------+-------------+----------+---------+ Mid forearm          0.23        0.30   branching +-----------------+-------------+----------+---------+ Dist forearm         0.26        0.33             +-----------------+-------------+----------+---------+ Wrist                0.26        0.23             +-----------------+-------------+----------+---------+ +-----------------+-------------+----------+---------+ Right Basilic    Diameter (cm)Depth (cm)Findings  +-----------------+-------------+----------+---------+ Prox upper arm       0.78        0.64             +-----------------+-------------+----------+---------+ Mid upper arm        0.59        1.01              +-----------------+-------------+----------+---------+ Dist upper arm       0.68        0.94             +-----------------+-------------+----------+---------+ Antecubital fossa    0.70        0.75   branching +-----------------+-------------+----------+---------+ Prox forearm         0.23        0.42             +-----------------+-------------+----------+---------+ Mid forearm          0.17        0.26             +-----------------+-------------+----------+---------+ Distal forearm       0.18        0.23             +-----------------+-------------+----------+---------+ Wrist                0.17        0.21             +-----------------+-------------+----------+---------+ +-----------------+-------------+----------+--------+ Left Cephalic    Diameter (cm)Depth (cm)Findings +-----------------+-------------+----------+--------+ Shoulder             0.19        0.62            +-----------------+-------------+----------+--------+ Prox upper arm       0.17        0.41            +-----------------+-------------+----------+--------+ Mid upper arm        0.17        0.38            +-----------------+-------------+----------+--------+  Dist upper arm       0.16        0.27            +-----------------+-------------+----------+--------+ Antecubital fossa    0.23        0.29            +-----------------+-------------+----------+--------+ Prox forearm         0.24        0.31            +-----------------+-------------+----------+--------+ Mid forearm          0.20        0.30            +-----------------+-------------+----------+--------+ Dist forearm         0.18        0.25            +-----------------+-------------+----------+--------+ Wrist                0.15        0.18            +-----------------+-------------+----------+--------+ +-----------------+-------------+----------+--------------+ Left Basilic     Diameter (cm)Depth  (cm)   Findings    +-----------------+-------------+----------+--------------+ Prox upper arm       0.64        1.31                  +-----------------+-------------+----------+--------------+ Mid upper arm        0.57        1.36                  +-----------------+-------------+----------+--------------+ Dist upper arm       0.41        1.41                  +-----------------+-------------+----------+--------------+ Antecubital fossa    0.41        0.71      Thrombus    +-----------------+-------------+----------+--------------+ Prox forearm         0.14        0.45                  +-----------------+-------------+----------+--------------+ Mid forearm                             not visualized +-----------------+-------------+----------+--------------+ Distal forearm                          not visualized +-----------------+-------------+----------+--------------+ Wrist                0.17        0.26                  +-----------------+-------------+----------+--------------+ *See table(s) above for measurements and observations.  Diagnosing physician: Debby Robertson Electronically signed by Debby Robertson on 05/15/2024 at 9:16:59 PM.    Final    ECHOCARDIOGRAM COMPLETE Result Date: 05/15/2024    ECHOCARDIOGRAM REPORT   Patient Name:   Bobby Stevens Date of Exam: 05/15/2024 Medical Rec #:  [id number]         Height:       73.0 in Accession #:    [id number]        Weight:       173.3 lb Date of Birth:  1971-06-19        BSA:          2.024  m Patient Age:    52 years          BP:           164/106 mmHg Patient Gender: M                 HR:           94 bpm. Exam Location:  Inpatient Procedure: 2D Echo, Cardiac Doppler, Color Doppler and Strain Analysis (Both            Spectral and Color Flow Doppler were utilized during procedure). Indications:     CHF  History:         Patient has prior history of Echocardiogram examinations, most                  recent  03/31/2021. Cardiomyopathy; Risk Factors:CKD.  Sonographer:     Therisa Crouch Referring Phys:  [id number] EVA KATHEE PORE Diagnosing Phys: Morene Brownie IMPRESSIONS  1. Left ventricular ejection fraction, by estimation, is 30 to 35%. The left ventricle has moderately decreased function. The left ventricle demonstrates global hypokinesis. Left ventricular diastolic parameters are consistent with Grade II diastolic dysfunction (pseudonormalization). The average left ventricular global longitudinal strain is -14.4 %. The global longitudinal strain is abnormal.  2. Right ventricular systolic function is normal. The right ventricular size is mildly enlarged.  3. Left atrial size was moderately dilated.  4. The mitral valve is normal in structure. Mild mitral valve regurgitation. No evidence of mitral stenosis.  5. The aortic valve is normal in structure. Aortic valve regurgitation is not visualized. No aortic stenosis is present.  6. Aortic dilatation noted. There is borderline dilatation of the ascending aorta, measuring 40 mm.  7. The inferior vena cava is normal in size with greater than 50% respiratory variability, suggesting right atrial pressure of 3 mmHg. Comparison(s): Changes from prior study are noted. LV function significantly worse. FINDINGS  Left Ventricle: Left ventricular ejection fraction, by estimation, is 30 to 35%. The left ventricle has moderately decreased function. The left ventricle demonstrates global hypokinesis. The average left ventricular global longitudinal strain is -14.4 %. Strain was performed and the global longitudinal strain is abnormal. The left ventricular internal cavity size was normal in size. There is no left ventricular hypertrophy. Left ventricular diastolic parameters are consistent with Grade II diastolic dysfunction (pseudonormalization). Right Ventricle: The right ventricular size is mildly enlarged. No increase in right ventricular wall thickness. Right ventricular systolic  function is normal. Left Atrium: Left atrial size was moderately dilated. Right Atrium: Right atrial size was normal in size. Pericardium: There is no evidence of pericardial effusion. Mitral Valve: The mitral valve is normal in structure. Mild mitral valve regurgitation. No evidence of mitral valve stenosis. Tricuspid Valve: The tricuspid valve is normal in structure. Tricuspid valve regurgitation is mild . No evidence of tricuspid stenosis. Aortic Valve: The aortic valve is normal in structure. Aortic valve regurgitation is not visualized. No aortic stenosis is present. Pulmonic Valve: The pulmonic valve was normal in structure. Pulmonic valve regurgitation is mild. No evidence of pulmonic stenosis. Aorta: The aortic root is normal in size and structure and aortic dilatation noted. There is borderline dilatation of the ascending aorta, measuring 40 mm. Venous: The inferior vena cava is normal in size with greater than 50% respiratory variability, suggesting right atrial pressure of 3 mmHg. IAS/Shunts: No atrial level shunt detected by color flow Doppler.  LEFT VENTRICLE PLAX 2D LVIDd:  5.60 cm      Diastology LVIDs:         4.70 cm      LV e' medial:    5.03 cm/s LV PW:         1.20 cm      LV E/e' medial:  20.5 LV IVS:        1.20 cm      LV e' lateral:   5.19 cm/s LVOT diam:     2.20 cm      LV E/e' lateral: 19.8 LVOT Area:     3.80 cm                             2D Longitudinal Strain                             2D Strain GLS Avg:     -14.4 % LV Volumes (MOD) LV vol d, MOD A2C: 190.0 ml LV vol d, MOD A4C: 153.0 ml LV vol s, MOD A2C: 109.0 ml LV vol s, MOD A4C: 89.0 ml LV SV MOD A2C:     81.0 ml LV SV MOD A4C:     153.0 ml LV SV MOD BP:      77.5 ml RIGHT VENTRICLE             IVC RV Basal diam:  4.40 cm     IVC diam: 2.30 cm RV S prime:     12.90 cm/s TAPSE (M-mode): 2.1 cm LEFT ATRIUM              Index        RIGHT ATRIUM           Index LA diam:        4.40 cm  2.17 cm/m   RA Area:     19.90 cm LA  Vol (A2C):   77.3 ml  38.18 ml/m  RA Volume:   62.10 ml  30.68 ml/m LA Vol (A4C):   103.0 ml 50.88 ml/m LA Biplane Vol: 92.2 ml  45.54 ml/m   AORTA Ao Root diam: 4.00 cm Ao Asc diam:  4.00 cm MITRAL VALVE                  TRICUSPID VALVE MV Area (PHT): 7.66 cm       TR Peak grad:   28.7 mmHg MV Decel Time: 99 msec        TR Vmax:        268.00 cm/s MR Peak grad:    127.2 mmHg MR Mean grad:    89.0 mmHg    SHUNTS MR Vmax:         564.00 cm/s  Systemic Diam: 2.20 cm MR Vmean:        453.0 cm/s MR PISA:         1.01 cm MR PISA Eff ROA: 5 mm MR PISA Radius:  0.40 cm MV E velocity: 103.00 cm/s MV A velocity: 54.00 cm/s MV E/A ratio:  1.91 Morene Brownie Electronically signed by Morene Brownie Signature Date/Time: 05/15/2024/6:51:57 PM    Final (Updated)    US  RENAL Result Date: 05/15/2024 CLINICAL DATA:  Acute renal failure EXAM: RENAL / URINARY TRACT ULTRASOUND COMPLETE COMPARISON:  None Available. FINDINGS: Right Kidney: Renal measurements: 9.7 x 3.9 x 4.2 cm = volume: 82 mL. Renal cortical echogenicity is diffusely increased in keeping  with changes of underlying medical renal disease. Cortical thickness is preserved. No intrarenal masses or calcifications. No hydronephrosis. No perinephric fluid collections are seen. Left Kidney: Renal measurements: 10.1 x 4.1 x 3.4 cm = volume: 73 mL. Renal cortical echogenicity is diffusely increased in keeping with changes of underlying medical renal disease. Cortical thickness is preserved. No intrarenal masses or calcifications. No hydronephrosis. No perinephric fluid collections are seen. 2.3 cm simple cyst is seen within the interpolar region of the left kidney for which no follow-up imaging is recommended. Bladder: Appears normal for degree of bladder distention. Other: Moderate left pleural effusion. IMPRESSION: 1. Diffusely increased renal cortical echogenicity in keeping with changes of underlying medical renal disease. No hydronephrosis. 2. Moderate left pleural  effusion. Electronically Signed   By: Dorethia Molt M.D.   On: 05/15/2024 01:58    Medications: Infusions:  iron  sucrose Stopped (05/15/24 1324)   vancomycin       Scheduled Medications:  amLODipine   10 mg Oral Daily   Chlorhexidine  Gluconate Cloth  6 each Topical Q0600   furosemide   80 mg Intravenous BID   hydrALAZINE   50 mg Oral Q8H   insulin  aspart  0-6 Units Subcutaneous TID WC    have reviewed scheduled and prn medications.  Physical Exam: General: Laying bed, no acute distress Heart: Regular rate Lungs: Breathing comfortably Abdomen: Soft, nontender, nondistended Extremities: No lower extremity edema bilateral lower extremities Dialysis Access: Right IJ HD catheter   ------------------------------------------------------------- Toma Edwards, DO Internal Medicine Resident, PGY 2  05/16/2024,11:18 AM  LOS: 2 days

## 2024-05-17 ENCOUNTER — Telehealth: Payer: Self-pay

## 2024-05-17 ENCOUNTER — Other Ambulatory Visit (HOSPITAL_BASED_OUTPATIENT_CLINIC_OR_DEPARTMENT_OTHER): Payer: Self-pay

## 2024-05-17 DIAGNOSIS — I5033 Acute on chronic diastolic (congestive) heart failure: Secondary | ICD-10-CM | POA: Diagnosis not present

## 2024-05-17 LAB — BASIC METABOLIC PANEL WITH GFR
Anion gap: 15 (ref 5–15)
BUN: 70 mg/dL — ABNORMAL HIGH (ref 6–20)
CO2: 23 mmol/L (ref 22–32)
Calcium: 8.7 mg/dL — ABNORMAL LOW (ref 8.9–10.3)
Chloride: 100 mmol/L (ref 98–111)
Creatinine, Ser: 10.85 mg/dL — ABNORMAL HIGH (ref 0.61–1.24)
GFR, Estimated: 5 mL/min — ABNORMAL LOW (ref >60)
Glucose, Bld: 108 mg/dL — ABNORMAL HIGH (ref 70–99)
Potassium: 3.5 mmol/L (ref 3.5–5.1)
Sodium: 138 mmol/L (ref 135–145)

## 2024-05-17 LAB — CBC WITH DIFFERENTIAL/PLATELET
Abs Immature Granulocytes: 0.03 K/uL (ref 0.00–0.07)
Basophils Absolute: 0 K/uL (ref 0.0–0.1)
Basophils Relative: 0 %
Eosinophils Absolute: 1.5 K/uL — ABNORMAL HIGH (ref 0.0–0.5)
Eosinophils Relative: 13 %
HCT: 30.6 % — ABNORMAL LOW (ref 39.0–52.0)
Hemoglobin: 9.9 g/dL — ABNORMAL LOW (ref 13.0–17.0)
Immature Granulocytes: 0 %
Lymphocytes Relative: 9 %
Lymphs Abs: 0.9 K/uL (ref 0.7–4.0)
MCH: 26.9 pg (ref 26.0–34.0)
MCHC: 32.4 g/dL (ref 30.0–36.0)
MCV: 83.2 fL (ref 80.0–100.0)
Monocytes Absolute: 0.6 K/uL (ref 0.1–1.0)
Monocytes Relative: 6 %
Neutro Abs: 7.8 K/uL — ABNORMAL HIGH (ref 1.7–7.7)
Neutrophils Relative %: 72 %
Platelets: 288 K/uL (ref 150–400)
RBC: 3.68 MIL/uL — ABNORMAL LOW (ref 4.22–5.81)
RDW: 12.4 % (ref 11.5–15.5)
WBC: 10.9 K/uL — ABNORMAL HIGH (ref 4.0–10.5)
nRBC: 0 % (ref 0.0–0.2)

## 2024-05-17 LAB — GLUCOSE, CAPILLARY
Glucose-Capillary: 65 mg/dL — ABNORMAL LOW (ref 70–99)
Glucose-Capillary: 98 mg/dL (ref 70–99)
Glucose-Capillary: 88 mg/dL (ref 70–99)
Glucose-Capillary: 153 mg/dL — ABNORMAL HIGH (ref 70–99)
Glucose-Capillary: 141 mg/dL — ABNORMAL HIGH (ref 70–99)

## 2024-05-17 LAB — PARATHYROID HORMONE, INTACT (NO CA): PTH: 350 pg/mL — ABNORMAL HIGH (ref 15–65)

## 2024-05-17 MED ORDER — HEPARIN SODIUM (PORCINE) 1000 UNIT/ML IJ SOLN
INTRAMUSCULAR | Status: AC
  Filled 2024-05-17: qty 4

## 2024-05-17 MED ORDER — HEPARIN SODIUM (PORCINE) 1000 UNIT/ML IJ SOLN
3800.0000 [IU] | Freq: Once | INTRAMUSCULAR | Status: AC
  Administered 2024-05-17: 3800 [IU]

## 2024-05-17 MED ORDER — FERRIC CITRATE 1 GM 210 MG(FE) PO TABS
420.0000 mg | ORAL_TABLET | Freq: Three times a day (TID) | ORAL | Status: DC
  Administered 2024-05-17 – 2024-05-18 (×4): 420 mg via ORAL
  Filled 2024-05-17 (×5): qty 2

## 2024-05-17 MED ORDER — CARVEDILOL 3.125 MG PO TABS: 3.1250 mg | ORAL_TABLET | Freq: Two times a day (BID) | ORAL | Status: DC

## 2024-05-17 MED ORDER — VITAMIN D 25 MCG (1000 UNIT) PO TABS
5000.0000 [IU] | ORAL_TABLET | ORAL | Status: DC
  Administered 2024-05-17: 5000 [IU] via ORAL
  Filled 2024-05-17: qty 5

## 2024-05-17 MED ORDER — CALCITRIOL 0.25 MCG PO CAPS
ORAL_CAPSULE | ORAL | Status: AC
Start: 2024-05-17 — End: 2024-05-17
  Filled 2024-05-17: qty 1

## 2024-05-17 MED ORDER — CARVEDILOL 6.25 MG PO TABS
6.2500 mg | ORAL_TABLET | Freq: Two times a day (BID) | ORAL | Status: DC
  Administered 2024-05-17 – 2024-05-18 (×2): 6.25 mg via ORAL
  Filled 2024-05-17 (×2): qty 1

## 2024-05-17 MED ORDER — CALCITRIOL 0.25 MCG PO CAPS
0.2500 ug | ORAL_CAPSULE | ORAL | Status: DC
  Administered 2024-05-17: 0.25 ug via ORAL

## 2024-05-17 NOTE — Progress Notes (Signed)
 Pt completed dialysis treatment without issue. The patient goal met and alert.  05/17/24 1149  Vitals  Temp 98.5 F (36.9 C)  BP (!) 167/108  Pulse Rate 96  Resp 20  Oxygen Therapy  SpO2 100 %  O2 Device Room Air  Patient Activity (if Appropriate) In bed  Pulse Oximetry Type Continuous  During Treatment Monitoring  Blood Flow Rate (mL/min) 0 mL/min  Arterial Pressure (mmHg) -0.61 mmHg  Venous Pressure (mmHg) -1.41 mmHg  TMP (mmHg) 15.35 mmHg  Ultrafiltration Rate (mL/min) 511 mL/min  Dialysate Flow Rate (mL/min) 299 ml/min  Duration of HD Treatment -hour(s) 2.99 hour(s)  Cumulative Fluid Removed (mL) per Treatment  996.8  Intra-Hemodialysis Comments Tx initiated;Progressing as prescribed  Post Treatment  Dialyzer Clearance Clear  Hemodialysis Intake (mL) 0 mL  Liters Processed 56  Fluid Removed (mL) 1000 mL  Tolerated HD Treatment Yes  Post-Hemodialysis Comments pt goal met.  Hemodialysis Catheter Right Internal jugular Double lumen Permanent (Tunneled)  Placement Date/Time: 05/16/24 0842   Serial / Lot #: [id number]  Expiration Date: 01/14/29  Time Out: Correct patient;Correct site;Correct procedure  Maximum sterile barrier precautions: Hand hygiene;Cap;Mask;Sterile gown;Sterile gloves;Large sterile s...  Site Condition No complications  Blue Lumen Status Heparin  locked  Red Lumen Status Heparin  locked  Catheter fill solution Heparin  1000 units/ml  Catheter fill volume (Arterial) 1.9 cc  Catheter fill volume (Venous) 1.9  Dressing Type Transparent  Dressing Status Antimicrobial disc/dressing in place;Clean, Dry, Intact  Interventions New dressing  Drainage Description None  Dressing Change Due 05/23/24  Post treatment catheter status Capped and Clamped

## 2024-05-17 NOTE — Progress Notes (Signed)
 New Dialysis Start    Patient identified as new dialysis start. Kidney Education packet assembled and given. Discussed the following items with patient:     Current medications and possible changes once started:  Discussed that patient's medications may change over time.  Ex; hypertension medications and diabetes medication.  Nephrologists will adjust as needed.   Fluid restrictions reviewed:  32 oz daily goal:  All liquids count; soups, ice, jello, fruits. Will also refer dietitian.   Phosphorus and potassium: Handout given showing high potassium and phosphorus foods.  Alternative food and drink options given. Will also refer dietitian.   Family support:  In Dialysis so none at bedside.   Outpatient Clinic Resources:  Discussed roles of Outpatient clinic staff and advised to make a list of needs, if any, to talk with outpatient staff if needed.   Care plan schedule: Informed patient of Care Plans in outpatient setting and to participate in the care plan.  An invitation would be given from outpatient clinic.    Dialysis Access Options:  Reviewed access options with patients. Discussed in detail about care at home with new AVG & AVF. Reviewed checking bruit and thrill. If dialysis catheter present, educated that patient could not take showers.  Catheter dressing changes were to be done by outpatient clinic staff only.   Home therapy options:  Educated patient about home therapy options:  PD vs home hemo.     Patient verbalized understanding. Will continue to round on patient during admission.    Alfonso Sar Dialysis Nurse Coordinator [phone]

## 2024-05-17 NOTE — Progress Notes (Signed)
   05/17/24 9357  Provider Notification  Provider Name/Title Dr. Franky  Date Provider Notified 05/17/24  Time Provider Notified [phone]  Method of Notification Page  Notification Reason Change in status (BP 175-180/112-114 mmHg, HR 96. Pt has no PRN med for high BP.)  Provider response See new orders;Evaluate remotely (MD orders to give morning dose Amlodipine  10 mg earlier.)  Date of Provider Response 05/17/24  Time of Provider Response 0651   Wendi Dash, RN

## 2024-05-17 NOTE — Progress Notes (Signed)
 PROGRESS NOTE    Bobby Stevens  FMW:[id number] DOB: 03/12/1971 DOA: 05/14/2024 PCP: Celestia Rosaline SQUIBB, NP   Brief Narrative:   53 yo male with the past medical history of CKD stage 4 to 5, hypertension, T2DM, and heart failure who presented with dyspnea. Reported 2 weeks of progressive dyspnea, associated with worsening lower extremity edema. Apparently he has not been adherent to his medications, he stopped taking blood pressure medications 6 months prior to admission. Being managed for acute on chronic diastolic CHF and progressive CKD 5, now started on HD. Needs outpt HD set up.  Assessment & Plan:  Principal Problem:   Acute on chronic diastolic CHF (congestive heart failure) (HCC) Active Problems:   Chronic kidney disease, stage 5 (HCC)   Essential hypertension   DM2 (diabetes mellitus, type 2) (HCC)    Acute on chronic systolic CHF: NYHA class III  Continue furosemide  for diuresis and blood pressure control with amlodipine  and hydralazine .  ECHO showed EF of 30-35% with Grade II diastolic dysfunction. Limited GDMT in the setting of worsening renal function. Started on low dose coreg  on 05/17/24   AKI on Chronic kidney disease, stage 5 with anemia of chronic disease and renal osteodystrophy  Progressive renal disease to ESRD.  S/p Right internal jugular TDC placement by IR on 7/31 First session of HD done on 7/31. Second session will be done today. Need left arm fistula placement as an outpatient.     Anemia of chronic renal disease with iron  deficiency.  On iron  replacement.    Essential hypertension Continue with amlodipine , lasix  and hydralazine . Off of nitroglycerin  drip   DM2 (diabetes mellitus, type 2)  complicated by diabetic nephropathy,POA:  Continue glucose cover and monitoring with insulin  sliding scale.   Disposition: Home. He will need out patient HD set up.  DVT prophylaxis: SCDs Start: 05/14/24 2054     Code Status: Full Code Family  Communication:  None at the bedside Status is: Inpatient Remains inpatient appropriate because: New dialysis  Subjective:  Going for second session of HD today. No acute issues overnight.  Examination:  General exam: Appears calm and comfortable  Respiratory system: Clear to auscultation. Respiratory effort normal. Cardiovascular system: S1 & S2 heard, RRR. No JVD, murmurs, rubs, gallops or clicks. trace pedal edema. Gastrointestinal system: Abdomen is nondistended, soft and nontender. No organomegaly or masses felt. Normal bowel sounds heard. Central nervous system: Alert and oriented. No focal neurological deficits. Extremities: Symmetric 5 x 5 power. Skin: No rashes, lesions or ulcers Psychiatry: Judgement and insight appear normal. Mood & affect appropriate.    Diet Orders (From admission, onward)     Start     Ordered   05/16/24 1030  Diet renal with fluid restriction Fluid restriction: 1200 mL Fluid; Room service appropriate? Yes; Fluid consistency: Thin  Diet effective now       Question Answer Comment  Fluid restriction: 1200 mL Fluid   Room service appropriate? Yes   Fluid consistency: Thin      05/16/24 1029            Objective: Vitals:   05/17/24 0930 05/17/24 1000 05/17/24 1030 05/17/24 1100  BP: (!) 161/102 (!) 145/98 (!) 150/98 (!) 140/96  Pulse: 99 92 94 93  Resp: 19 17 19 18   Temp:      TempSrc:      SpO2: 100% 100% 100% 100%  Weight:      Height:        Intake/Output Summary (Last  24 hours) at 05/17/2024 1122 Last data filed at 05/17/2024 0746 Gross per 24 hour  Intake 730 ml  Output 1780 ml  Net -1050 ml   Filed Weights   05/16/24 1337 05/17/24 0625 05/17/24 0825  Weight: 80 kg 74.9 kg 78.4 kg    Scheduled Meds:  amLODipine   10 mg Oral Daily   calcitRIOL  0.25 mcg Oral Q M,W,F   Chlorhexidine  Gluconate Cloth  6 each Topical Q0600   cholecalciferol  5,000 Units Oral Q M,W,F   ferric citrate  420 mg Oral TID WC   furosemide   80 mg  Intravenous BID   hydrALAZINE   50 mg Oral Q8H   insulin  aspart  0-6 Units Subcutaneous TID WC   Continuous Infusions:  iron  sucrose Stopped (05/15/24 1324)    Nutritional status     Body mass index is 22.8 kg/m.  Data Reviewed:   CBC: Recent Labs  Lab 05/14/24 0548 05/15/24 0012 05/16/24 0411 05/17/24 0255  WBC 10.5 10.0 11.5* 10.9*  NEUTROABS 7.5 7.1  --  7.8*  HGB 10.0* 8.7* 9.4* 9.9*  HCT 30.5* 27.0* 28.8* 30.6*  MCV 82.7 83.6 82.8 83.2  PLT 298 254 289 288   Basic Metabolic Panel: Recent Labs  Lab 05/14/24 0548 05/14/24 1419 05/15/24 0012 05/15/24 1454 05/15/24 1455 05/16/24 0411 05/17/24 0255  NA 140 138 136  --  137 139 138  K 3.6 3.6 3.9  --  3.8 3.6 3.5  CL 101 102 103  --  104 103 100  CO2 21* 19* 20*  --  20* 20* 23  GLUCOSE 105* 140* 149*  --  119* 95 108*  BUN 87* 94* 95*  --  95* 93* 70*  CREATININE 11.90* 12.00* 12.07*  --  12.49* 12.88* 10.85*  CALCIUM 9.0 8.6* 8.3*  --  8.2* 8.2* 8.7*  MG 2.3  --  2.2  --   --   --   --   PHOS  --   --  7.9* 7.9* 7.9*  --   --    GFR: Estimated Creatinine Clearance: 8.7 mL/min (A) (by C-G formula based on SCr of 10.85 mg/dL (H)). Liver Function Tests: Recent Labs  Lab 05/14/24 0548 05/15/24 0012 05/15/24 1455  AST 21 16  --   ALT 23 18  --   ALKPHOS 74 52  --   BILITOT 0.6 0.5  --   PROT 6.8 5.7*  --   ALBUMIN 4.1 2.9* 2.8*   No results for input(s): LIPASE, AMYLASE in the last 168 hours. No results for input(s): AMMONIA in the last 168 hours. Coagulation Profile: Recent Labs  Lab 05/15/24 0314  INR 1.3*   Cardiac Enzymes: No results for input(s): CKTOTAL, CKMB, CKMBINDEX, TROPONINI in the last 168 hours. BNP (last 3 results) Recent Labs    05/14/24 0548  PROBNP 30,686.0*   HbA1C: Recent Labs    05/15/24 0314  HGBA1C 4.3*   CBG: Recent Labs  Lab 05/16/24 1235 05/16/24 1708 05/16/24 1723 05/16/24 2108 05/17/24 0622  GLUCAP 252* 65* 94 155* 98   Lipid  Profile: No results for input(s): CHOL, HDL, LDLCALC, TRIG, CHOLHDL, LDLDIRECT in the last 72 hours. Thyroid Function Tests: No results for input(s): TSH, T4TOTAL, FREET4, T3FREE, THYROIDAB in the last 72 hours. Anemia Panel: Recent Labs    05/15/24 0314  FERRITIN 145  TIBC 340  IRON  31*   Sepsis Labs: No results for input(s): PROCALCITON, LATICACIDVEN in the last 168 hours.  Recent Results (from  the past 240 hours)  MRSA Next Gen by PCR, Nasal     Status: None   Collection Time: 05/14/24  8:15 PM   Specimen: Nasal Mucosa; Nasal Swab  Result Value Ref Range Status   MRSA by PCR Next Gen NOT DETECTED NOT DETECTED Final    Comment: (NOTE) The GeneXpert MRSA Assay (FDA approved for NASAL specimens only), is one component of a comprehensive MRSA colonization surveillance program. It is not intended to diagnose MRSA infection nor to guide or monitor treatment for MRSA infections. Test performance is not FDA approved in patients less than 54 years old. Performed at [facility], [address], [place], [place] [address]          Radiology Studies: IR Fluoro Guide CV Line Right Result Date: 05/16/2024 INDICATION: 53 year old male with history of end-stage renal disease requiring central venous access for dialysis. EXAM: TUNNELED CENTRAL VENOUS HEMODIALYSIS CATHETER PLACEMENT WITH ULTRASOUND AND FLUOROSCOPIC GUIDANCE MEDICATIONS: None. ANESTHESIA/SEDATION: Moderate (conscious) sedation was employed during this procedure. A total of Versed  1 mg and Fentanyl  50 mcg was administered intravenously. Moderate Sedation Time: 13 minutes. The patient's level of consciousness and vital signs were monitored continuously by radiology nursing throughout the procedure under my direct supervision. FLUOROSCOPY TIME:  One mGy reference air kerma COMPLICATIONS: None immediate. PROCEDURE: Informed written consent was obtained from the patient after a discussion of the  risks, benefits, and alternatives to treatment. Questions regarding the procedure were encouraged and answered. The right neck and chest were prepped with chlorhexidine  in a sterile fashion, and a sterile drape was applied covering the operative field. Maximum barrier sterile technique with sterile gowns and gloves were used for the procedure. A timeout was performed prior to the initiation of the procedure. After creating a small venotomy incision, a 21 gauge micropuncture kit was utilized to access the internal jugular vein. Real-time ultrasound guidance was utilized for vascular access including the acquisition of a permanent ultrasound image documenting patency of the accessed vessel. A Rosen wire was advanced to the level of the IVC and the micropuncture sheath was exchanged for an 8 Fr dilator. A 14.5 French tunneled hemodialysis catheter measuring 23 cm from tip to cuff was tunneled in a retrograde fashion from the anterior chest wall to the venotomy incision. Serial dilation was then performed an a peel-away sheath was placed. The catheter was then placed through the peel-away sheath with the catheter tip ultimately positioned within the right atrium. Final catheter positioning was confirmed and documented with a spot radiographic image. The catheter aspirates and flushes normally. The catheter was flushed with appropriate volume heparin  dwells. The catheter exit site was secured with a 0-Silk retention suture. The venotomy incision was closed with Dermabond. Sterile dressings were applied. The patient tolerated the procedure well without immediate post procedural complication. IMPRESSION: Successful placement of 23 cm tip to cuff tunneled hemodialysis catheter via the right internal jugular vein with catheter tip terminating within the right atrium. The catheter is ready for immediate use. Ester Sides, MD [facility] Radiology Electronically Signed   By:  Ester Sides M.D.   On: 05/16/2024 11:27   IR US  Guide Vasc Access Right Result Date: 05/16/2024 INDICATION: 53 year old male with history of end-stage renal disease requiring central venous access for dialysis. EXAM: TUNNELED CENTRAL VENOUS HEMODIALYSIS CATHETER PLACEMENT WITH ULTRASOUND AND FLUOROSCOPIC GUIDANCE MEDICATIONS: None. ANESTHESIA/SEDATION: Moderate (conscious) sedation was employed during this procedure. A total of Versed  1 mg and Fentanyl  50  mcg was administered intravenously. Moderate Sedation Time: 13 minutes. The patient's level of consciousness and vital signs were monitored continuously by radiology nursing throughout the procedure under my direct supervision. FLUOROSCOPY TIME:  One mGy reference air kerma COMPLICATIONS: None immediate. PROCEDURE: Informed written consent was obtained from the patient after a discussion of the risks, benefits, and alternatives to treatment. Questions regarding the procedure were encouraged and answered. The right neck and chest were prepped with chlorhexidine  in a sterile fashion, and a sterile drape was applied covering the operative field. Maximum barrier sterile technique with sterile gowns and gloves were used for the procedure. A timeout was performed prior to the initiation of the procedure. After creating a small venotomy incision, a 21 gauge micropuncture kit was utilized to access the internal jugular vein. Real-time ultrasound guidance was utilized for vascular access including the acquisition of a permanent ultrasound image documenting patency of the accessed vessel. A Rosen wire was advanced to the level of the IVC and the micropuncture sheath was exchanged for an 8 Fr dilator. A 14.5 French tunneled hemodialysis catheter measuring 23 cm from tip to cuff was tunneled in a retrograde fashion from the anterior chest wall to the venotomy incision. Serial dilation was then performed an a peel-away sheath was placed. The catheter was then placed  through the peel-away sheath with the catheter tip ultimately positioned within the right atrium. Final catheter positioning was confirmed and documented with a spot radiographic image. The catheter aspirates and flushes normally. The catheter was flushed with appropriate volume heparin  dwells. The catheter exit site was secured with a 0-Silk retention suture. The venotomy incision was closed with Dermabond. Sterile dressings were applied. The patient tolerated the procedure well without immediate post procedural complication. IMPRESSION: Successful placement of 23 cm tip to cuff tunneled hemodialysis catheter via the right internal jugular vein with catheter tip terminating within the right atrium. The catheter is ready for immediate use. Ester Sides, MD [facility] Radiology Electronically Signed   By: Ester Sides M.D.   On: 05/16/2024 11:27   VAS US  UPPER EXT VEIN MAPPING (PRE-OP  AVF) Result Date: 05/15/2024 UPPER EXTREMITY VEIN MAPPING Patient Name:  Bobby Stevens  Date of Exam:   05/15/2024 Medical Rec #: [id number]          Accession #:    [id number] Date of Birth: 1971-03-07         Patient Gender: M Patient Age:   31 years Exam Location:  [facility] Procedure:      VAS US  UPPER EXT VEIN MAPPING (PRE-OP  AVF) Referring Phys: DONNICE SENDER --------------------------------------------------------------------------------  Indications: Pre-access. History: Type 2 diabetes melitus, CHF, HTN, and ESRD.  Performing Technologist: Ricka Sturdivant-Jones RDMS, RVT  Examination Guidelines: A complete evaluation includes B-mode imaging, spectral Doppler, color Doppler, and power Doppler as needed of all accessible portions of each vessel. Bilateral testing is considered an integral part of a complete examination. Limited examinations for reoccurring indications may be performed as noted. +-----------------+-------------+----------+---------+ Right  Cephalic   Diameter (cm)Depth (cm)Findings  +-----------------+-------------+----------+---------+ Shoulder             0.37        0.67             +-----------------+-------------+----------+---------+ Prox upper arm       0.21        0.43             +-----------------+-------------+----------+---------+ Mid upper arm  0.29        0.27             +-----------------+-------------+----------+---------+ Dist upper arm       0.31        0.25             +-----------------+-------------+----------+---------+ Antecubital fossa    0.43        0.32             +-----------------+-------------+----------+---------+ Prox forearm         0.28        0.45             +-----------------+-------------+----------+---------+ Mid forearm          0.23        0.30   branching +-----------------+-------------+----------+---------+ Dist forearm         0.26        0.33             +-----------------+-------------+----------+---------+ Wrist                0.26        0.23             +-----------------+-------------+----------+---------+ +-----------------+-------------+----------+---------+ Right Basilic    Diameter (cm)Depth (cm)Findings  +-----------------+-------------+----------+---------+ Prox upper arm       0.78        0.64             +-----------------+-------------+----------+---------+ Mid upper arm        0.59        1.01             +-----------------+-------------+----------+---------+ Dist upper arm       0.68        0.94             +-----------------+-------------+----------+---------+ Antecubital fossa    0.70        0.75   branching +-----------------+-------------+----------+---------+ Prox forearm         0.23        0.42             +-----------------+-------------+----------+---------+ Mid forearm          0.17        0.26             +-----------------+-------------+----------+---------+ Distal forearm       0.18         0.23             +-----------------+-------------+----------+---------+ Wrist                0.17        0.21             +-----------------+-------------+----------+---------+ +-----------------+-------------+----------+--------+ Left Cephalic    Diameter (cm)Depth (cm)Findings +-----------------+-------------+----------+--------+ Shoulder             0.19        0.62            +-----------------+-------------+----------+--------+ Prox upper arm       0.17        0.41            +-----------------+-------------+----------+--------+ Mid upper arm        0.17        0.38            +-----------------+-------------+----------+--------+ Dist upper arm       0.16        0.27            +-----------------+-------------+----------+--------+ Antecubital fossa  0.23        0.29            +-----------------+-------------+----------+--------+ Prox forearm         0.24        0.31            +-----------------+-------------+----------+--------+ Mid forearm          0.20        0.30            +-----------------+-------------+----------+--------+ Dist forearm         0.18        0.25            +-----------------+-------------+----------+--------+ Wrist                0.15        0.18            +-----------------+-------------+----------+--------+ +-----------------+-------------+----------+--------------+ Left Basilic     Diameter (cm)Depth (cm)   Findings    +-----------------+-------------+----------+--------------+ Prox upper arm       0.64        1.31                  +-----------------+-------------+----------+--------------+ Mid upper arm        0.57        1.36                  +-----------------+-------------+----------+--------------+ Dist upper arm       0.41        1.41                  +-----------------+-------------+----------+--------------+ Antecubital fossa    0.41        0.71      Thrombus     +-----------------+-------------+----------+--------------+ Prox forearm         0.14        0.45                  +-----------------+-------------+----------+--------------+ Mid forearm                             not visualized +-----------------+-------------+----------+--------------+ Distal forearm                          not visualized +-----------------+-------------+----------+--------------+ Wrist                0.17        0.26                  +-----------------+-------------+----------+--------------+ *See table(s) above for measurements and observations.  Diagnosing physician: Debby Robertson Electronically signed by Debby Robertson on 05/15/2024 at 9:16:59 PM.    Final       LOS: 3 days   Time spent= 43 mins    Deliliah Room, MD [facility]  If 7PM-7AM, please contact night-coverage  05/17/2024, 11:22 AM

## 2024-05-17 NOTE — Plan of Care (Signed)
  Problem: Education: Goal: Knowledge of General Education information will improve Description: Including pain rating scale, medication(s)/side effects and non-pharmacologic comfort measures Outcome: Progressing   Problem: Clinical Measurements: Goal: Will remain free from infection Outcome: Progressing Goal: Diagnostic test results will improve Outcome: Progressing   Problem: Nutrition: Goal: Adequate nutrition will be maintained Outcome: Progressing   Problem: Coping: Goal: Level of anxiety will decrease Outcome: Progressing   Problem: Safety: Goal: Ability to remain free from injury will improve Outcome: Progressing

## 2024-05-17 NOTE — Discharge Planning (Signed)
  [facility] Initial Dialysis Orders   Patient Name: PRITHVI KOOI  Admission Date: 05/14/2024 Discharge Date: 05/18/2024 - tentatively Dialysis Unit: [facility] - to start 05/20/24 tentatively  Admitting Diagnosis: new ESRD - due to DM - 1st HD 05/16/24 HTN emergency  Other PMHx HL T2DM HFrEF (EF 30-35%)   Discharge Labs: Basic Metabolic Panel: Recent Labs  Lab 05/15/24 0012 05/15/24 1454 05/15/24 1455 05/16/24 0411 05/17/24 0255  NA 136  --  137 139 138  K 3.9  --  3.8 3.6 3.5  CL 103  --  104 103 100  CO2 20*  --  20* 20* 23  GLUCOSE 149*  --  119* 95 108*  BUN 95*  --  95* 93* 70*  CREATININE 12.07*  --  12.49* 12.88* 10.85*  CALCIUM 8.3*  --  8.2* 8.2* 8.7*  PHOS 7.9* 7.9* 7.9*  --   --    CBC: Recent Labs  Lab 05/14/24 0548 05/15/24 0012 05/16/24 0411 05/17/24 0255  WBC 10.5 10.0 11.5* 10.9*  NEUTROABS 7.5 7.1  --  7.8*  HGB 10.0* 8.7* 9.4* 9.9*  HCT 30.5* 27.0* 28.8* 30.6*  MCV 82.7 83.6 82.8 83.2  PLT 298 254 289 288    Dialysis Orders: 4 hours 180 dialyzer BFR 400 DFR A1.5 EDW 75kg -> keep lowering until edema resolved and normotensive 3K/2.5Ca bath TDC no UF or Na profile  Dialysis medications: Heparin  2000 unit bolus + TDC locks Venofer  100mg  q HD x 5 - completing course for tsat 9% Mircera - per protocol Calcitriol - per protocol, PTH 350 on 05/15/24  Other/Appts/Lab orders: - monthly labs on admit, weekly K and Hgb - VVS consulted, plan for outpatient AVF placement - Will need f/u cardiology for repeat echo once euvolemic with controlled BP   Lamarr JONELLE Boehringer, PA-C 05/17/2024, 4:04 PM  [facility] [phone]

## 2024-05-17 NOTE — Progress Notes (Signed)
 Case discussed with nephrologist. Pt for possible d/c this weekend. Contacted [facility] to be made aware of pt's possible d/c this weekend and plan to start on Monday. Renal PA aware of clinic's need for orders at d/c. Pt advised of HD arrangements yesterday and agreeable to plans. HD arrangements placed on AVS as well. Will assist as needed.   Randine Mungo Dialysis Navigator [phone]

## 2024-05-17 NOTE — Plan of Care (Signed)
 Pt is stable hemodynamically, NSR on the monitor, afebrile, normal respiratory effort, no acute distress noted overnight. He is able to rest well with no major complaints. Plan of care is reviewed. Pt has been progressing. We will continue to monitor.  Problem: Clinical Measurements: Goal: Ability to maintain clinical measurements within normal limits will improve Outcome: Progressing Goal: Will remain free from infection Outcome: Progressing Goal: Diagnostic test results will improve Outcome: Progressing Goal: Respiratory complications will improve Outcome: Progressing Goal: Cardiovascular complication will be avoided Outcome: Progressing   Problem: Elimination: Goal: Will not experience complications related to bowel motility Outcome: Progressing Goal: Will not experience complications related to urinary retention Outcome: Progressing   Problem: Fluid Volume: Goal: Ability to maintain a balanced intake and output will improve Outcome: Progressing   Problem: Tissue Perfusion: Goal: Adequacy of tissue perfusion will improve Outcome: Progressing   Wendi Dash, RN

## 2024-05-17 NOTE — Telephone Encounter (Signed)
 Attempted to call for surgery scheduling. LVM

## 2024-05-17 NOTE — Progress Notes (Addendum)
 53 y.o. yo male admitted 05/14/2024 with acute on chronic CHF and ARF. PMH significant for CKD stage IV/V, HTN, DM p;/w SOB x2weeks, hypertensive crisis. Naproxen 2-3 times a week for not feeling well.  Off his medications about 6 months ago, reports that the medications he was taking caused him to feel nauseous.  Had been following with Dr. [facility].  Assessment/Plan:  Acute Renal Failure, progressing to ESRD  Prior CKD stage V, eGFR 14  Presented to ED with Scr 11.90, baseline Scr 4.59 noted 10/2023; labs pertinent for BUN 87. UA showed small blood and proteinuria. FeNa 17% indicating intrinsic cause, post IV lasix  80 mg. RUQ noted diffusely increased renal cortical echogenicity, no hydronephrosis.  No obstruction noted.  Patient stopped taking all of his medications about 6 months ago, etiology likely in the setting of chronic uncontrolled hypertension and volume overload resulting in acute renal failure. Unfortunately he has just progressed to ESRD; he was told a while ago that he had very advanced kidney disease which will need dialysis in the near future which scared the patient away but he understands the need for dialysis at this current time.   - Appreciate RIJ TC by VIR on 7/31   - Tolerated HD #1 Th and on for #2 today. - He has a Monday Wednesday Friday spot at [facility] second shift; he can start on Monday with a 1215 chair time but needs to be there at 1145 the first day.  - Appreciate [facility] seeing the patient; they will obtain these as being and schedule outpatient placement of the access given scheduling conflicts  - Renally dose medications  - Strict Is and Os and daily weights - RESTRICTING LEFT ARM for future fistula. BP, IV and Labs on the R arm.    Iron  deficiency Anemia Anemia of chronic kidney disease  Presented with Hgb 9.4. Iron  studies indicating low saturation ratios 9 and iron  31.   - loading  w/ IV iron  replacement therapy -> venofer  200mg  daily  (started 7/30)   Renal osteodystrophy Phosphorus 7.9 (start Auryxia 2), 25 vitamin D  very low  and PTH level (350); cholecalciferol 5000U MWF, calcitriol 0.25 mwf   Hypertensive Crisis  Resistant HTN/hypertensive cardiomyopathy  Patient stopped taking all medications about 6 months ago. Currently on amlodipine  10 mg, UF with dialysis will help as well.    HFrecEF Exacerbation Elevated troponins  Moderate left pleural effusion BNP 1,373 with symptoms of DOE and volume overload.  Chest x-ray noted mild diffuse increase in interstitial markings, edema.  ECHO pending. - manage by primary  Subjective:    Patient is evaluated at bedside, no apparent distress.  Denies any chest pain, shortness of breath, abdominal pain.  Denies any nausea or vomiting.  No issues with dialysis on Thursday   Objective Vital signs in last 24 hours: Vitals:   05/17/24 0637 05/17/24 0638 05/17/24 0639 05/17/24 0744  BP: (!) 175/114 (!) 175/114 (!) 175/117 (!) 166/103  Pulse: (!) 102 97 99 (!) 103  Resp: 20 20 20  (!) 21  Temp:    98.7 F (37.1 C)  TempSrc:    Oral  SpO2: 99% 99% 99%   Weight:      Height:       Weight change:   Intake/Output Summary (Last 24 hours) at 05/17/2024 0800 Last data filed at 05/17/2024 0746 Gross per 24 hour  Intake 730 ml  Output 1780 ml  Net -1050 ml    Labs: Basic Metabolic Panel:  Recent Labs  Lab 05/15/24 0012 05/15/24 1454 05/15/24 1455 05/16/24 0411 05/17/24 0255  NA 136  --  137 139 138  K 3.9  --  3.8 3.6 3.5  CL 103  --  104 103 100  CO2 20*  --  20* 20* 23  GLUCOSE 149*  --  119* 95 108*  BUN 95*  --  95* 93* 70*  CREATININE 12.07*  --  12.49* 12.88* 10.85*  CALCIUM 8.3*  --  8.2* 8.2* 8.7*  PHOS 7.9* 7.9* 7.9*  --   --    Liver Function Tests: Recent Labs  Lab 05/14/24 0548 05/15/24 0012 05/15/24 1455  AST 21 16  --   ALT 23 18  --   ALKPHOS 74 52  --   BILITOT 0.6 0.5  --   PROT 6.8 5.7*  --   ALBUMIN 4.1 2.9* 2.8*   No results for  input(s): LIPASE, AMYLASE in the last 168 hours. No results for input(s): AMMONIA in the last 168 hours. CBC: Recent Labs  Lab 05/14/24 0548 05/15/24 0012 05/16/24 0411 05/17/24 0255  WBC 10.5 10.0 11.5* 10.9*  NEUTROABS 7.5 7.1  --  7.8*  HGB 10.0* 8.7* 9.4* 9.9*  HCT 30.5* 27.0* 28.8* 30.6*  MCV 82.7 83.6 82.8 83.2  PLT 298 254 289 288   Cardiac Enzymes: No results for input(s): CKTOTAL, CKMB, CKMBINDEX, TROPONINI in the last 168 hours. CBG: Recent Labs  Lab 05/16/24 0601 05/16/24 1235 05/16/24 1723 05/16/24 2108 05/17/24 0622  GLUCAP 104* 252* 94 155* 98    Iron  Studies:  Recent Labs    05/15/24 0314  IRON  31*  TIBC 340  FERRITIN 145   Studies/Results: IR Fluoro Guide CV Line Right Result Date: 05/16/2024 INDICATION: 53 year old male with history of end-stage renal disease requiring central venous access for dialysis. EXAM: TUNNELED CENTRAL VENOUS HEMODIALYSIS CATHETER PLACEMENT WITH ULTRASOUND AND FLUOROSCOPIC GUIDANCE MEDICATIONS: None. ANESTHESIA/SEDATION: Moderate (conscious) sedation was employed during this procedure. A total of Versed  1 mg and Fentanyl  50 mcg was administered intravenously. Moderate Sedation Time: 13 minutes. The patient's level of consciousness and vital signs were monitored continuously by radiology nursing throughout the procedure under my direct supervision. FLUOROSCOPY TIME:  One mGy reference air kerma COMPLICATIONS: None immediate. PROCEDURE: Informed written consent was obtained from the patient after a discussion of the risks, benefits, and alternatives to treatment. Questions regarding the procedure were encouraged and answered. The right neck and chest were prepped with chlorhexidine  in a sterile fashion, and a sterile drape was applied covering the operative field. Maximum barrier sterile technique with sterile gowns and gloves were used for the procedure. A timeout was performed prior to the initiation of the procedure.  After creating a small venotomy incision, a 21 gauge micropuncture kit was utilized to access the internal jugular vein. Real-time ultrasound guidance was utilized for vascular access including the acquisition of a permanent ultrasound image documenting patency of the accessed vessel. A Rosen wire was advanced to the level of the IVC and the micropuncture sheath was exchanged for an 8 Fr dilator. A 14.5 French tunneled hemodialysis catheter measuring 23 cm from tip to cuff was tunneled in a retrograde fashion from the anterior chest wall to the venotomy incision. Serial dilation was then performed an a peel-away sheath was placed. The catheter was then placed through the peel-away sheath with the catheter tip ultimately positioned within the right atrium. Final catheter positioning was confirmed and documented with a spot radiographic image. The catheter  aspirates and flushes normally. The catheter was flushed with appropriate volume heparin  dwells. The catheter exit site was secured with a 0-Silk retention suture. The venotomy incision was closed with Dermabond. Sterile dressings were applied. The patient tolerated the procedure well without immediate post procedural complication. IMPRESSION: Successful placement of 23 cm tip to cuff tunneled hemodialysis catheter via the right internal jugular vein with catheter tip terminating within the right atrium. The catheter is ready for immediate use. Ester Sides, MD [facility] Radiology Electronically Signed   By: Ester Sides M.D.   On: 05/16/2024 11:27   IR US  Guide Vasc Access Right Result Date: 05/16/2024 INDICATION: 53 year old male with history of end-stage renal disease requiring central venous access for dialysis. EXAM: TUNNELED CENTRAL VENOUS HEMODIALYSIS CATHETER PLACEMENT WITH ULTRASOUND AND FLUOROSCOPIC GUIDANCE MEDICATIONS: None. ANESTHESIA/SEDATION: Moderate (conscious) sedation was employed during this  procedure. A total of Versed  1 mg and Fentanyl  50 mcg was administered intravenously. Moderate Sedation Time: 13 minutes. The patient's level of consciousness and vital signs were monitored continuously by radiology nursing throughout the procedure under my direct supervision. FLUOROSCOPY TIME:  One mGy reference air kerma COMPLICATIONS: None immediate. PROCEDURE: Informed written consent was obtained from the patient after a discussion of the risks, benefits, and alternatives to treatment. Questions regarding the procedure were encouraged and answered. The right neck and chest were prepped with chlorhexidine  in a sterile fashion, and a sterile drape was applied covering the operative field. Maximum barrier sterile technique with sterile gowns and gloves were used for the procedure. A timeout was performed prior to the initiation of the procedure. After creating a small venotomy incision, a 21 gauge micropuncture kit was utilized to access the internal jugular vein. Real-time ultrasound guidance was utilized for vascular access including the acquisition of a permanent ultrasound image documenting patency of the accessed vessel. A Rosen wire was advanced to the level of the IVC and the micropuncture sheath was exchanged for an 8 Fr dilator. A 14.5 French tunneled hemodialysis catheter measuring 23 cm from tip to cuff was tunneled in a retrograde fashion from the anterior chest wall to the venotomy incision. Serial dilation was then performed an a peel-away sheath was placed. The catheter was then placed through the peel-away sheath with the catheter tip ultimately positioned within the right atrium. Final catheter positioning was confirmed and documented with a spot radiographic image. The catheter aspirates and flushes normally. The catheter was flushed with appropriate volume heparin  dwells. The catheter exit site was secured with a 0-Silk retention suture. The venotomy incision was closed with Dermabond. Sterile  dressings were applied. The patient tolerated the procedure well without immediate post procedural complication. IMPRESSION: Successful placement of 23 cm tip to cuff tunneled hemodialysis catheter via the right internal jugular vein with catheter tip terminating within the right atrium. The catheter is ready for immediate use. Ester Sides, MD [facility] Radiology Electronically Signed   By: Ester Sides M.D.   On: 05/16/2024 11:27   VAS US  UPPER EXT VEIN MAPPING (PRE-OP  AVF) Result Date: 05/15/2024 UPPER EXTREMITY VEIN MAPPING Patient Name:  Bobby Stevens  Date of Exam:   05/15/2024 Medical Rec #: [id number]          Accession #:    [id number] Date of Birth: Sep 18, 1971         Patient Gender: M Patient Age:   64 years Exam Location:  [facility] Procedure:  VAS US  UPPER EXT VEIN MAPPING (PRE-OP  AVF) Referring Phys: DONNICE SENDER --------------------------------------------------------------------------------  Indications: Pre-access. History: Type 2 diabetes melitus, CHF, HTN, and ESRD.  Performing Technologist: Ricka Sturdivant-Jones RDMS, RVT  Examination Guidelines: A complete evaluation includes B-mode imaging, spectral Doppler, color Doppler, and power Doppler as needed of all accessible portions of each vessel. Bilateral testing is considered an integral part of a complete examination. Limited examinations for reoccurring indications may be performed as noted. +-----------------+-------------+----------+---------+ Right Cephalic   Diameter (cm)Depth (cm)Findings  +-----------------+-------------+----------+---------+ Shoulder             0.37        0.67             +-----------------+-------------+----------+---------+ Prox upper arm       0.21        0.43             +-----------------+-------------+----------+---------+ Mid upper arm        0.29        0.27              +-----------------+-------------+----------+---------+ Dist upper arm       0.31        0.25             +-----------------+-------------+----------+---------+ Antecubital fossa    0.43        0.32             +-----------------+-------------+----------+---------+ Prox forearm         0.28        0.45             +-----------------+-------------+----------+---------+ Mid forearm          0.23        0.30   branching +-----------------+-------------+----------+---------+ Dist forearm         0.26        0.33             +-----------------+-------------+----------+---------+ Wrist                0.26        0.23             +-----------------+-------------+----------+---------+ +-----------------+-------------+----------+---------+ Right Basilic    Diameter (cm)Depth (cm)Findings  +-----------------+-------------+----------+---------+ Prox upper arm       0.78        0.64             +-----------------+-------------+----------+---------+ Mid upper arm        0.59        1.01             +-----------------+-------------+----------+---------+ Dist upper arm       0.68        0.94             +-----------------+-------------+----------+---------+ Antecubital fossa    0.70        0.75   branching +-----------------+-------------+----------+---------+ Prox forearm         0.23        0.42             +-----------------+-------------+----------+---------+ Mid forearm          0.17        0.26             +-----------------+-------------+----------+---------+ Distal forearm       0.18        0.23             +-----------------+-------------+----------+---------+ Wrist  0.17        0.21             +-----------------+-------------+----------+---------+ +-----------------+-------------+----------+--------+ Left Cephalic    Diameter (cm)Depth (cm)Findings +-----------------+-------------+----------+--------+ Shoulder             0.19         0.62            +-----------------+-------------+----------+--------+ Prox upper arm       0.17        0.41            +-----------------+-------------+----------+--------+ Mid upper arm        0.17        0.38            +-----------------+-------------+----------+--------+ Dist upper arm       0.16        0.27            +-----------------+-------------+----------+--------+ Antecubital fossa    0.23        0.29            +-----------------+-------------+----------+--------+ Prox forearm         0.24        0.31            +-----------------+-------------+----------+--------+ Mid forearm          0.20        0.30            +-----------------+-------------+----------+--------+ Dist forearm         0.18        0.25            +-----------------+-------------+----------+--------+ Wrist                0.15        0.18            +-----------------+-------------+----------+--------+ +-----------------+-------------+----------+--------------+ Left Basilic     Diameter (cm)Depth (cm)   Findings    +-----------------+-------------+----------+--------------+ Prox upper arm       0.64        1.31                  +-----------------+-------------+----------+--------------+ Mid upper arm        0.57        1.36                  +-----------------+-------------+----------+--------------+ Dist upper arm       0.41        1.41                  +-----------------+-------------+----------+--------------+ Antecubital fossa    0.41        0.71      Thrombus    +-----------------+-------------+----------+--------------+ Prox forearm         0.14        0.45                  +-----------------+-------------+----------+--------------+ Mid forearm                             not visualized +-----------------+-------------+----------+--------------+ Distal forearm                          not visualized  +-----------------+-------------+----------+--------------+ Wrist                0.17        0.26                  +-----------------+-------------+----------+--------------+ *  See table(s) above for measurements and observations.  Diagnosing physician: Debby Robertson Electronically signed by Debby Robertson on 05/15/2024 at 9:16:59 PM.    Final    ECHOCARDIOGRAM COMPLETE Result Date: 05/15/2024    ECHOCARDIOGRAM REPORT   Patient Name:   FROYLAN HOBBY Date of Exam: 05/15/2024 Medical Rec #:  [id number]         Height:       73.0 in Accession #:    [id number]        Weight:       173.3 lb Date of Birth:  [birth year]        BSA:          2.024 m Patient Age:    52 years          BP:           164/106 mmHg Patient Gender: M                 HR:           94 bpm. Exam Location:  Inpatient Procedure: 2D Echo, Cardiac Doppler, Color Doppler and Strain Analysis (Both            Spectral and Color Flow Doppler were utilized during procedure). Indications:     CHF  History:         Patient has prior history of Echocardiogram examinations, most                  recent 03/31/2021. Cardiomyopathy; Risk Factors:CKD.  Sonographer:     Therisa Crouch Referring Phys:  [id number] EVA KATHEE PORE Diagnosing Phys: Morene Brownie IMPRESSIONS  1. Left ventricular ejection fraction, by estimation, is 30 to 35%. The left ventricle has moderately decreased function. The left ventricle demonstrates global hypokinesis. Left ventricular diastolic parameters are consistent with Grade II diastolic dysfunction (pseudonormalization). The average left ventricular global longitudinal strain is -14.4 %. The global longitudinal strain is abnormal.  2. Right ventricular systolic function is normal. The right ventricular size is mildly enlarged.  3. Left atrial size was moderately dilated.  4. The mitral valve is normal in structure. Mild mitral valve regurgitation. No evidence of mitral stenosis.  5. The aortic valve is normal in structure. Aortic  valve regurgitation is not visualized. No aortic stenosis is present.  6. Aortic dilatation noted. There is borderline dilatation of the ascending aorta, measuring 40 mm.  7. The inferior vena cava is normal in size with greater than 50% respiratory variability, suggesting right atrial pressure of 3 mmHg. Comparison(s): Changes from prior study are noted. LV function significantly worse. FINDINGS  Left Ventricle: Left ventricular ejection fraction, by estimation, is 30 to 35%. The left ventricle has moderately decreased function. The left ventricle demonstrates global hypokinesis. The average left ventricular global longitudinal strain is -14.4 %. Strain was performed and the global longitudinal strain is abnormal. The left ventricular internal cavity size was normal in size. There is no left ventricular hypertrophy. Left ventricular diastolic parameters are consistent with Grade II diastolic dysfunction (pseudonormalization). Right Ventricle: The right ventricular size is mildly enlarged. No increase in right ventricular wall thickness. Right ventricular systolic function is normal. Left Atrium: Left atrial size was moderately dilated. Right Atrium: Right atrial size was normal in size. Pericardium: There is no evidence of pericardial effusion. Mitral Valve: The mitral valve is normal in structure. Mild mitral valve regurgitation. No evidence of mitral valve stenosis. Tricuspid Valve: The tricuspid valve is normal in structure. Tricuspid valve regurgitation  is mild . No evidence of tricuspid stenosis. Aortic Valve: The aortic valve is normal in structure. Aortic valve regurgitation is not visualized. No aortic stenosis is present. Pulmonic Valve: The pulmonic valve was normal in structure. Pulmonic valve regurgitation is mild. No evidence of pulmonic stenosis. Aorta: The aortic root is normal in size and structure and aortic dilatation noted. There is borderline dilatation of the ascending aorta, measuring 40 mm.  Venous: The inferior vena cava is normal in size with greater than 50% respiratory variability, suggesting right atrial pressure of 3 mmHg. IAS/Shunts: No atrial level shunt detected by color flow Doppler.  LEFT VENTRICLE PLAX 2D LVIDd:         5.60 cm      Diastology LVIDs:         4.70 cm      LV e' medial:    5.03 cm/s LV PW:         1.20 cm      LV E/e' medial:  20.5 LV IVS:        1.20 cm      LV e' lateral:   5.19 cm/s LVOT diam:     2.20 cm      LV E/e' lateral: 19.8 LVOT Area:     3.80 cm                             2D Longitudinal Strain                             2D Strain GLS Avg:     -14.4 % LV Volumes (MOD) LV vol d, MOD A2C: 190.0 ml LV vol d, MOD A4C: 153.0 ml LV vol s, MOD A2C: 109.0 ml LV vol s, MOD A4C: 89.0 ml LV SV MOD A2C:     81.0 ml LV SV MOD A4C:     153.0 ml LV SV MOD BP:      77.5 ml RIGHT VENTRICLE             IVC RV Basal diam:  4.40 cm     IVC diam: 2.30 cm RV S prime:     12.90 cm/s TAPSE (M-mode): 2.1 cm LEFT ATRIUM              Index        RIGHT ATRIUM           Index LA diam:        4.40 cm  2.17 cm/m   RA Area:     19.90 cm LA Vol (A2C):   77.3 ml  38.18 ml/m  RA Volume:   62.10 ml  30.68 ml/m LA Vol (A4C):   103.0 ml 50.88 ml/m LA Biplane Vol: 92.2 ml  45.54 ml/m   AORTA Ao Root diam: 4.00 cm Ao Asc diam:  4.00 cm MITRAL VALVE                  TRICUSPID VALVE MV Area (PHT): 7.66 cm       TR Peak grad:   28.7 mmHg MV Decel Time: 99 msec        TR Vmax:        268.00 cm/s MR Peak grad:    127.2 mmHg MR Mean grad:    89.0 mmHg    SHUNTS MR Vmax:         564.00 cm/s  Systemic Diam: 2.20 cm MR Vmean:        453.0 cm/s MR PISA:         1.01 cm MR PISA Eff ROA: 5 mm MR PISA Radius:  0.40 cm MV E velocity: 103.00 cm/s MV A velocity: 54.00 cm/s MV E/A ratio:  1.91 Morene Brownie Electronically signed by Morene Brownie Signature Date/Time: 05/15/2024/6:51:57 PM    Final (Updated)     Medications: Infusions:  iron  sucrose Stopped (05/15/24 1324)    Scheduled  Medications:  amLODipine   10 mg Oral Daily   Chlorhexidine  Gluconate Cloth  6 each Topical Q0600   furosemide   80 mg Intravenous BID   hydrALAZINE   50 mg Oral Q8H   insulin  aspart  0-6 Units Subcutaneous TID WC    have reviewed scheduled and prn medications.  Physical Exam: General: Laying bed, no acute distress Heart: Regular rate Lungs: Breathing comfortably Abdomen: Soft, nontender, nondistended Extremities: No lower extremity edema bilateral lower extremities Dialysis Access: Right IJ TC

## 2024-05-17 NOTE — Progress Notes (Signed)
 Pharmacist Heart Failure Core Measure Documentation  Assessment: Bobby Stevens has an EF documented as 30-35% on 05/15/24 by Echo.  Rationale: Heart failure patients with left ventricular systolic dysfunction (LVSD) and an EF < 40% should be prescribed an angiotensin converting enzyme inhibitor (ACEI) or angiotensin receptor blocker (ARB) at discharge unless a contraindication is documented in the medical record.  This patient is not currently on an ACEI or ARB for HF.  This note is being placed in the record in order to provide documentation that a contraindication to the use of these agents is present for this encounter.  ACE Inhibitor or Angiotensin Receptor Blocker is contraindicated (specify all that apply)  []   ACEI allergy AND ARB allergy []   Angioedema []   Moderate or severe aortic stenosis []   Hyperkalemia []   Hypotension []   Renal artery stenosis [x]   Worsening renal function, preexisting renal disease or dysfunction  Thank you for involving pharmacy in this patient's care.  Delon Sax, PharmD, BCPS Clinical Pharmacist Clinical phone for 05/17/2024 is [phone] 05/17/2024 8:22 AM

## 2024-05-18 ENCOUNTER — Other Ambulatory Visit (HOSPITAL_COMMUNITY): Payer: Self-pay

## 2024-05-18 ENCOUNTER — Other Ambulatory Visit (HOSPITAL_BASED_OUTPATIENT_CLINIC_OR_DEPARTMENT_OTHER): Payer: Self-pay

## 2024-05-18 ENCOUNTER — Emergency Department (HOSPITAL_COMMUNITY)
Admission: EM | Admit: 2024-05-18 | Discharge: 2024-05-18 | Disposition: A | Discharge status: Home / Self Care | Attending: Emergency Medicine | Admitting: Emergency Medicine

## 2024-05-18 ENCOUNTER — Emergency Department (HOSPITAL_COMMUNITY)

## 2024-05-18 DIAGNOSIS — N186 End stage renal disease: Secondary | ICD-10-CM | POA: Diagnosis not present

## 2024-05-18 DIAGNOSIS — I132 Hypertensive heart and chronic kidney disease with heart failure and with stage 5 chronic kidney disease, or end stage renal disease: Secondary | ICD-10-CM | POA: Diagnosis not present

## 2024-05-18 DIAGNOSIS — R42 Dizziness and giddiness: Secondary | ICD-10-CM | POA: Insufficient documentation

## 2024-05-18 DIAGNOSIS — I5033 Acute on chronic diastolic (congestive) heart failure: Secondary | ICD-10-CM | POA: Diagnosis not present

## 2024-05-18 DIAGNOSIS — I509 Heart failure, unspecified: Secondary | ICD-10-CM | POA: Insufficient documentation

## 2024-05-18 DIAGNOSIS — Z79899 Other long term (current) drug therapy: Secondary | ICD-10-CM | POA: Diagnosis not present

## 2024-05-18 LAB — BASIC METABOLIC PANEL WITH GFR
Anion gap: 16 — ABNORMAL HIGH (ref 5–15)
BUN: 65 mg/dL — ABNORMAL HIGH (ref 6–20)
CO2: 24 mmol/L (ref 22–32)
Calcium: 8.9 mg/dL (ref 8.9–10.3)
Chloride: 96 mmol/L — ABNORMAL LOW (ref 98–111)
Creatinine, Ser: 10.02 mg/dL — ABNORMAL HIGH (ref 0.61–1.24)
GFR, Estimated: 6 mL/min — ABNORMAL LOW (ref >60)
Glucose, Bld: 120 mg/dL — ABNORMAL HIGH (ref 70–99)
Potassium: 3.8 mmol/L (ref 3.5–5.1)
Sodium: 136 mmol/L (ref 135–145)

## 2024-05-18 LAB — CBC WITH DIFFERENTIAL/PLATELET
Basophils Absolute: 0 K/uL (ref 0.0–0.1)
Basophils Relative: 0 %
Eosinophils Absolute: 1.7 K/uL — ABNORMAL HIGH (ref 0.0–0.5)
Eosinophils Relative: 12 %
HCT: 32.3 % — ABNORMAL LOW (ref 39.0–52.0)
Hemoglobin: 10.1 g/dL — ABNORMAL LOW (ref 13.0–17.0)
Lymphocytes Relative: 6 %
Lymphs Abs: 0.8 K/uL (ref 0.7–4.0)
MCH: 26.8 pg (ref 26.0–34.0)
MCHC: 31.3 g/dL (ref 30.0–36.0)
MCV: 85.7 fL (ref 80.0–100.0)
Monocytes Absolute: 0.7 K/uL (ref 0.1–1.0)
Monocytes Relative: 5 %
Neutro Abs: 10.6 K/uL — ABNORMAL HIGH (ref 1.7–7.7)
Neutrophils Relative %: 77 %
Platelets: 331 K/uL (ref 150–400)
RBC: 3.77 MIL/uL — ABNORMAL LOW (ref 4.22–5.81)
RDW: 12 % (ref 11.5–15.5)
Smear Review: NORMAL
WBC: 13.8 K/uL — ABNORMAL HIGH (ref 4.0–10.5)
nRBC: 0 % (ref 0.0–0.2)

## 2024-05-18 LAB — GLUCOSE, CAPILLARY
Glucose-Capillary: 96 mg/dL (ref 70–99)
Glucose-Capillary: 103 mg/dL — ABNORMAL HIGH (ref 70–99)

## 2024-05-18 LAB — CBG MONITORING, ED: Glucose-Capillary: 107 mg/dL — ABNORMAL HIGH (ref 70–99)

## 2024-05-18 LAB — TROPONIN I (HIGH SENSITIVITY): Troponin I (High Sensitivity): 84 ng/L — ABNORMAL HIGH (ref <18)

## 2024-05-18 MED ORDER — CARVEDILOL 25 MG PO TABS
12.5000 mg | ORAL_TABLET | Freq: Two times a day (BID) | ORAL | 0 refills | Status: AC
  Filled 2024-05-18 (×2): qty 30, 30d supply, fill #0

## 2024-05-18 MED ORDER — FUROSEMIDE 80 MG PO TABS
80.0000 mg | ORAL_TABLET | ORAL | 0 refills | Status: AC
  Filled 2024-05-18 (×2): qty 30, 52d supply, fill #0

## 2024-05-18 MED ORDER — HYDRALAZINE HCL 50 MG PO TABS
50.0000 mg | ORAL_TABLET | Freq: Three times a day (TID) | ORAL | 0 refills | Status: DC
  Filled 2024-05-18 (×2): qty 60, 20d supply, fill #0

## 2024-05-18 MED ORDER — FERROUS SULFATE 325 (65 FE) MG PO TABS
1950.0000 mg | ORAL_TABLET | Freq: Three times a day (TID) | ORAL | 0 refills | Status: AC
  Filled 2024-05-18: qty 180, 10d supply, fill #0

## 2024-05-18 MED ORDER — VITAMIN D3 25 MCG PO TABS
5000.0000 [IU] | ORAL_TABLET | ORAL | 0 refills | Status: AC
Start: 2024-05-20 — End: ?
  Filled 2024-05-18 (×2): qty 15, 7d supply, fill #0

## 2024-05-18 MED ORDER — FERRIC CITRATE 1 GM 210 MG(FE) PO TABS
420.0000 mg | ORAL_TABLET | Freq: Three times a day (TID) | ORAL | 0 refills | Status: AC
Start: 2024-05-18 — End: ?
  Filled 2024-05-18 (×2): qty 60, 10d supply, fill #0

## 2024-05-18 MED ORDER — SODIUM CHLORIDE 0.9 % IV BOLUS
500.0000 mL | Freq: Once | INTRAVENOUS | Status: AC
  Administered 2024-05-18: 500 mL via INTRAVENOUS

## 2024-05-18 MED ORDER — CALCITRIOL 0.25 MCG PO CAPS
0.2500 ug | ORAL_CAPSULE | ORAL | 0 refills | Status: AC
  Filled 2024-05-18 (×2): qty 15, 35d supply, fill #0

## 2024-05-18 MED ORDER — HYDRALAZINE HCL 50 MG PO TABS
25.0000 mg | ORAL_TABLET | Freq: Three times a day (TID) | ORAL | 0 refills | Status: DC
  Filled 2024-05-18 – 2024-08-29 (×2): qty 60, 40d supply, fill #0

## 2024-05-18 NOTE — ED Triage Notes (Signed)
 BIB [facility] Dizziness and weakness. Discharged today from hospital. Started dialysis this week.  Orthostatics 127  to 106 systolic 146 CBG Unremarkable EKG

## 2024-05-18 NOTE — Discharge Summary (Signed)
 Physician Discharge Summary   Patient: Bobby Stevens MRN: [id number] DOB: 06-23-1971  Admit date:     05/14/2024  Discharge date: 05/18/24  Discharge Physician: Deliliah Room   PCP: Celestia Rosaline SQUIBB, NP   Recommendations at discharge:    Follow up with your PCP in one week Continue taking meds as prescribed HD on MWF, first session on Monday 8/4 at 1215 but has to be there by 11:45 am. Check BP daily. Lasix  80 mg daily on Tuesdays, Thursdays, Saturdays and Sundays only.  Discharge Diagnoses: Principal Problem:   Acute on chronic diastolic CHF (congestive heart failure) (HCC) Active Problems:   Chronic kidney disease, stage 5 (HCC)   Essential hypertension   DM2 (diabetes mellitus, type 2) [facility])  Hospital Course:  Acute on chronic systolic CHF: NYHA class III   Continue furosemide  for diuresis and blood pressure control with amlodipine  and hydralazine .  ECHO showed EF of 30-35% with Grade II diastolic dysfunction. Limited GDMT in the setting of ESRD Continue with coreg  12.5 mg BID  Not a candidate for SGLT2 inh, MRA or ARNI   AKI on Chronic kidney disease, stage 5 with anemia of chronic disease and renal osteodystrophy  Progressive renal disease to ESRD.  S/p Right internal jugular TDC placement by IR on 7/31 First session of HD done on 7/31. Second session done on 8/1. Need left arm fistula placement as an outpatient. MWF HD at [facility]     Anemia of chronic renal disease with iron  deficiency.  On iron  replacement.    Essential hypertension Continue with amlodipine , lasix , coreg  and hydralazine . Off of nitroglycerin  drip   DM2 (diabetes mellitus, type 2)  complicated by diabetic nephropathy,POA:  Continue glucose cover and monitoring with insulin  sliding scale.    Disposition: Home. Out patient HD set up is done.  Case discussed with Dr Melia Agent on the day of the discharge.      Consultants: Nephrology, IR Procedures performed:  Right internal jugular TDC placement by IR on 7/31   Disposition: Home Diet recommendation:  Renal diet DISCHARGE MEDICATION: Allergies as of 05/18/2024       Reactions   Penicillins Rash        Medication List     STOP taking these medications    chlorthalidone  25 MG tablet Commonly known as: HYGROTON    Comirnaty  syringe Generic drug: COVID-19 mRNA vaccine (Pfizer)   doxazosin  4 MG tablet Commonly known as: CARDURA    doxycycline  100 MG capsule Commonly known as: VIBRAMYCIN    metFORMIN  500 MG tablet Commonly known as: GLUCOPHAGE    telmisartan  20 MG tablet Commonly known as: MICARDIS    vitamin B-12 250 MCG tablet Commonly known as: CYANOCOBALAMIN        TAKE these medications    amLODipine  10 MG tablet Commonly known as: NORVASC  Take 1 tablet (10 mg total) by mouth daily.   calcitRIOL  0.25 MCG capsule Commonly known as: ROCALTROL  Take 1 capsule (0.25 mcg total) by mouth every Monday, Wednesday, and Friday. Start taking on: May 20, 2024   carvedilol  25 MG tablet Commonly known as: COREG  Take 0.5 tablets (12.5 mg total) by mouth 2 (two) times daily. What changed: how much to take   ferric citrate  1 GM 210 MG(Fe) tablet Commonly known as: AURYXIA  Take 2 tablets (420 mg total) by mouth 3 (three) times daily with meals.   furosemide  80 MG tablet Commonly known as: Lasix  Take 1 tablet (80 mg total) by mouth every Tuesday, Thursday, Saturday, and Sunday.  hydrALAZINE  50 MG tablet Commonly known as: APRESOLINE  Take 1 tablet (50 mg total) by mouth every 8 (eight) hours. What changed:  medication strength how much to take when to take this   isosorbide  mononitrate 30 MG 24 hr tablet Commonly known as: IMDUR  Take 1 tablet (30 mg total) by mouth daily.   vitamin D3 25 MCG tablet Commonly known as: CHOLECALCIFEROL  Take 5 tablets (5,000 Units total) by mouth every Monday, Wednesday, and Friday. Start taking on: May 20, 2024        Follow-up  Information     [facility]. Go on 05/20/2024.   Why: Monday, wednesday, friday schedule with 12:15 start time. On first day, please arrive 11:45. Contact information: [address] [place] [address] [phone]         Celestia Rosaline SQUIBB, NP. Call in 1 week(s).   Specialty: Internal Medicine Contact information: [address] [place] [address] [phone]                Discharge Exam: [facility] Weights   05/17/24 1152 05/17/24 1156 05/18/24 0429  Weight: 77.7 kg 76.7 kg 74.8 kg   General exam: Appears calm and comfortable  Respiratory system: Clear to auscultation. Respiratory effort normal. Cardiovascular system: S1 & S2 heard, RRR. No JVD, murmurs, rubs, gallops or clicks. trace pedal edema. Gastrointestinal system: Abdomen is nondistended, soft and nontender. No organomegaly or masses felt. Normal bowel sounds heard. Central nervous system: Alert and oriented. No focal neurological deficits. Extremities: Symmetric 5 x 5 power. Skin: No rashes, lesions or ulcers Psychiatry: Judgement and insight appear normal. Mood & affect appropriate.    Condition at discharge: good  The results of significant diagnostics from this hospitalization (including imaging, microbiology, ancillary and laboratory) are listed below for reference.   Imaging Studies: IR Fluoro Guide CV Line Right Result Date: 05/16/2024 INDICATION: 53 year old male with history of end-stage renal disease requiring central venous access for dialysis. EXAM: TUNNELED CENTRAL VENOUS HEMODIALYSIS CATHETER PLACEMENT WITH ULTRASOUND AND FLUOROSCOPIC GUIDANCE MEDICATIONS: None. ANESTHESIA/SEDATION: Moderate (conscious) sedation was employed during this procedure. A total of Versed  1 mg and Fentanyl  50 mcg was administered intravenously. Moderate Sedation Time: 13 minutes. The patient's level of consciousness and vital signs were monitored continuously by radiology nursing  throughout the procedure under my direct supervision. FLUOROSCOPY TIME:  One mGy reference air kerma COMPLICATIONS: None immediate. PROCEDURE: Informed written consent was obtained from the patient after a discussion of the risks, benefits, and alternatives to treatment. Questions regarding the procedure were encouraged and answered. The right neck and chest were prepped with chlorhexidine  in a sterile fashion, and a sterile drape was applied covering the operative field. Maximum barrier sterile technique with sterile gowns and gloves were used for the procedure. A timeout was performed prior to the initiation of the procedure. After creating a small venotomy incision, a 21 gauge micropuncture kit was utilized to access the internal jugular vein. Real-time ultrasound guidance was utilized for vascular access including the acquisition of a permanent ultrasound image documenting patency of the accessed vessel. A Rosen wire was advanced to the level of the IVC and the micropuncture sheath was exchanged for an 8 Fr dilator. A 14.5 French tunneled hemodialysis catheter measuring 23 cm from tip to cuff was tunneled in a retrograde fashion from the anterior chest wall to the venotomy incision. Serial dilation was then performed an a peel-away sheath was placed. The catheter was then placed through the peel-away sheath with the  catheter tip ultimately positioned within the right atrium. Final catheter positioning was confirmed and documented with a spot radiographic image. The catheter aspirates and flushes normally. The catheter was flushed with appropriate volume heparin  dwells. The catheter exit site was secured with a 0-Silk retention suture. The venotomy incision was closed with Dermabond. Sterile dressings were applied. The patient tolerated the procedure well without immediate post procedural complication. IMPRESSION: Successful placement of 23 cm tip to cuff tunneled hemodialysis catheter via the right internal  jugular vein with catheter tip terminating within the right atrium. The catheter is ready for immediate use. Ester Sides, MD [facility] Radiology Electronically Signed   By: Ester Sides M.D.   On: 05/16/2024 11:27   IR US  Guide Vasc Access Right Result Date: 05/16/2024 INDICATION: 53 year old male with history of end-stage renal disease requiring central venous access for dialysis. EXAM: TUNNELED CENTRAL VENOUS HEMODIALYSIS CATHETER PLACEMENT WITH ULTRASOUND AND FLUOROSCOPIC GUIDANCE MEDICATIONS: None. ANESTHESIA/SEDATION: Moderate (conscious) sedation was employed during this procedure. A total of Versed  1 mg and Fentanyl  50 mcg was administered intravenously. Moderate Sedation Time: 13 minutes. The patient's level of consciousness and vital signs were monitored continuously by radiology nursing throughout the procedure under my direct supervision. FLUOROSCOPY TIME:  One mGy reference air kerma COMPLICATIONS: None immediate. PROCEDURE: Informed written consent was obtained from the patient after a discussion of the risks, benefits, and alternatives to treatment. Questions regarding the procedure were encouraged and answered. The right neck and chest were prepped with chlorhexidine  in a sterile fashion, and a sterile drape was applied covering the operative field. Maximum barrier sterile technique with sterile gowns and gloves were used for the procedure. A timeout was performed prior to the initiation of the procedure. After creating a small venotomy incision, a 21 gauge micropuncture kit was utilized to access the internal jugular vein. Real-time ultrasound guidance was utilized for vascular access including the acquisition of a permanent ultrasound image documenting patency of the accessed vessel. A Rosen wire was advanced to the level of the IVC and the micropuncture sheath was exchanged for an 8 Fr dilator. A 14.5 French tunneled hemodialysis catheter  measuring 23 cm from tip to cuff was tunneled in a retrograde fashion from the anterior chest wall to the venotomy incision. Serial dilation was then performed an a peel-away sheath was placed. The catheter was then placed through the peel-away sheath with the catheter tip ultimately positioned within the right atrium. Final catheter positioning was confirmed and documented with a spot radiographic image. The catheter aspirates and flushes normally. The catheter was flushed with appropriate volume heparin  dwells. The catheter exit site was secured with a 0-Silk retention suture. The venotomy incision was closed with Dermabond. Sterile dressings were applied. The patient tolerated the procedure well without immediate post procedural complication. IMPRESSION: Successful placement of 23 cm tip to cuff tunneled hemodialysis catheter via the right internal jugular vein with catheter tip terminating within the right atrium. The catheter is ready for immediate use. Ester Sides, MD [facility] Radiology Electronically Signed   By: Ester Sides M.D.   On: 05/16/2024 11:27   VAS US  UPPER EXT VEIN MAPPING (PRE-OP  AVF) Result Date: 05/15/2024 UPPER EXTREMITY VEIN MAPPING Patient Name:  CAROLL CUNNINGTON  Date of Exam:   05/15/2024 Medical Rec #: [id number]          Accession #:    [id number] Date of Birth: 07/04/1971  Patient Gender: M Patient Age:   27 years Exam Location:  [facility] Procedure:      VAS US  UPPER EXT VEIN MAPPING (PRE-OP  AVF) Referring Phys: DONNICE SENDER --------------------------------------------------------------------------------  Indications: Pre-access. History: Type 2 diabetes melitus, CHF, HTN, and ESRD.  Performing Technologist: Ricka Sturdivant-Jones RDMS, RVT  Examination Guidelines: A complete evaluation includes B-mode imaging, spectral Doppler, color Doppler, and power Doppler as needed of all accessible portions of each  vessel. Bilateral testing is considered an integral part of a complete examination. Limited examinations for reoccurring indications may be performed as noted. +-----------------+-------------+----------+---------+ Right Cephalic   Diameter (cm)Depth (cm)Findings  +-----------------+-------------+----------+---------+ Shoulder             0.37        0.67             +-----------------+-------------+----------+---------+ Prox upper arm       0.21        0.43             +-----------------+-------------+----------+---------+ Mid upper arm        0.29        0.27             +-----------------+-------------+----------+---------+ Dist upper arm       0.31        0.25             +-----------------+-------------+----------+---------+ Antecubital fossa    0.43        0.32             +-----------------+-------------+----------+---------+ Prox forearm         0.28        0.45             +-----------------+-------------+----------+---------+ Mid forearm          0.23        0.30   branching +-----------------+-------------+----------+---------+ Dist forearm         0.26        0.33             +-----------------+-------------+----------+---------+ Wrist                0.26        0.23             +-----------------+-------------+----------+---------+ +-----------------+-------------+----------+---------+ Right Basilic    Diameter (cm)Depth (cm)Findings  +-----------------+-------------+----------+---------+ Prox upper arm       0.78        0.64             +-----------------+-------------+----------+---------+ Mid upper arm        0.59        1.01             +-----------------+-------------+----------+---------+ Dist upper arm       0.68        0.94             +-----------------+-------------+----------+---------+ Antecubital fossa    0.70        0.75   branching +-----------------+-------------+----------+---------+ Prox forearm         0.23         0.42             +-----------------+-------------+----------+---------+ Mid forearm          0.17        0.26             +-----------------+-------------+----------+---------+ Distal forearm       0.18        0.23             +-----------------+-------------+----------+---------+  Wrist                0.17        0.21             +-----------------+-------------+----------+---------+ +-----------------+-------------+----------+--------+ Left Cephalic    Diameter (cm)Depth (cm)Findings +-----------------+-------------+----------+--------+ Shoulder             0.19        0.62            +-----------------+-------------+----------+--------+ Prox upper arm       0.17        0.41            +-----------------+-------------+----------+--------+ Mid upper arm        0.17        0.38            +-----------------+-------------+----------+--------+ Dist upper arm       0.16        0.27            +-----------------+-------------+----------+--------+ Antecubital fossa    0.23        0.29            +-----------------+-------------+----------+--------+ Prox forearm         0.24        0.31            +-----------------+-------------+----------+--------+ Mid forearm          0.20        0.30            +-----------------+-------------+----------+--------+ Dist forearm         0.18        0.25            +-----------------+-------------+----------+--------+ Wrist                0.15        0.18            +-----------------+-------------+----------+--------+ +-----------------+-------------+----------+--------------+ Left Basilic     Diameter (cm)Depth (cm)   Findings    +-----------------+-------------+----------+--------------+ Prox upper arm       0.64        1.31                  +-----------------+-------------+----------+--------------+ Mid upper arm        0.57        1.36                   +-----------------+-------------+----------+--------------+ Dist upper arm       0.41        1.41                  +-----------------+-------------+----------+--------------+ Antecubital fossa    0.41        0.71      Thrombus    +-----------------+-------------+----------+--------------+ Prox forearm         0.14        0.45                  +-----------------+-------------+----------+--------------+ Mid forearm                             not visualized +-----------------+-------------+----------+--------------+ Distal forearm                          not visualized +-----------------+-------------+----------+--------------+ Wrist                0.17  0.26                  +-----------------+-------------+----------+--------------+ *See table(s) above for measurements and observations.  Diagnosing physician: Debby Robertson Electronically signed by Debby Robertson on 05/15/2024 at 9:16:59 PM.    Final    ECHOCARDIOGRAM COMPLETE Result Date: 05/15/2024    ECHOCARDIOGRAM REPORT   Patient Name:   ABHIMANYU CRUCES Date of Exam: 05/15/2024 Medical Rec #:  [id number]         Height:       73.0 in Accession #:    [id number]        Weight:       173.3 lb Date of Birth:  1971/01/19        BSA:          2.024 m Patient Age:    52 years          BP:           164/106 mmHg Patient Gender: M                 HR:           94 bpm. Exam Location:  Inpatient Procedure: 2D Echo, Cardiac Doppler, Color Doppler and Strain Analysis (Both            Spectral and Color Flow Doppler were utilized during procedure). Indications:     CHF  History:         Patient has prior history of Echocardiogram examinations, most                  recent 03/31/2021. Cardiomyopathy; Risk Factors:CKD.  Sonographer:     Therisa Crouch Referring Phys:  [id number] EVA KATHEE PORE Diagnosing Phys: Morene Brownie IMPRESSIONS  1. Left ventricular ejection fraction, by estimation, is 30 to 35%. The left ventricle has moderately  decreased function. The left ventricle demonstrates global hypokinesis. Left ventricular diastolic parameters are consistent with Grade II diastolic dysfunction (pseudonormalization). The average left ventricular global longitudinal strain is -14.4 %. The global longitudinal strain is abnormal.  2. Right ventricular systolic function is normal. The right ventricular size is mildly enlarged.  3. Left atrial size was moderately dilated.  4. The mitral valve is normal in structure. Mild mitral valve regurgitation. No evidence of mitral stenosis.  5. The aortic valve is normal in structure. Aortic valve regurgitation is not visualized. No aortic stenosis is present.  6. Aortic dilatation noted. There is borderline dilatation of the ascending aorta, measuring 40 mm.  7. The inferior vena cava is normal in size with greater than 50% respiratory variability, suggesting right atrial pressure of 3 mmHg. Comparison(s): Changes from prior study are noted. LV function significantly worse. FINDINGS  Left Ventricle: Left ventricular ejection fraction, by estimation, is 30 to 35%. The left ventricle has moderately decreased function. The left ventricle demonstrates global hypokinesis. The average left ventricular global longitudinal strain is -14.4 %. Strain was performed and the global longitudinal strain is abnormal. The left ventricular internal cavity size was normal in size. There is no left ventricular hypertrophy. Left ventricular diastolic parameters are consistent with Grade II diastolic dysfunction (pseudonormalization). Right Ventricle: The right ventricular size is mildly enlarged. No increase in right ventricular wall thickness. Right ventricular systolic function is normal. Left Atrium: Left atrial size was moderately dilated. Right Atrium: Right atrial size was normal in size. Pericardium: There is no evidence of pericardial effusion. Mitral Valve: The mitral valve is normal in structure. Mild mitral valve  regurgitation. No evidence of mitral valve stenosis. Tricuspid Valve: The tricuspid valve is normal in structure. Tricuspid valve regurgitation is mild . No evidence of tricuspid stenosis. Aortic Valve: The aortic valve is normal in structure. Aortic valve regurgitation is not visualized. No aortic stenosis is present. Pulmonic Valve: The pulmonic valve was normal in structure. Pulmonic valve regurgitation is mild. No evidence of pulmonic stenosis. Aorta: The aortic root is normal in size and structure and aortic dilatation noted. There is borderline dilatation of the ascending aorta, measuring 40 mm. Venous: The inferior vena cava is normal in size with greater than 50% respiratory variability, suggesting right atrial pressure of 3 mmHg. IAS/Shunts: No atrial level shunt detected by color flow Doppler.  LEFT VENTRICLE PLAX 2D LVIDd:         5.60 cm      Diastology LVIDs:         4.70 cm      LV e' medial:    5.03 cm/s LV PW:         1.20 cm      LV E/e' medial:  20.5 LV IVS:        1.20 cm      LV e' lateral:   5.19 cm/s LVOT diam:     2.20 cm      LV E/e' lateral: 19.8 LVOT Area:     3.80 cm                             2D Longitudinal Strain                             2D Strain GLS Avg:     -14.4 % LV Volumes (MOD) LV vol d, MOD A2C: 190.0 ml LV vol d, MOD A4C: 153.0 ml LV vol s, MOD A2C: 109.0 ml LV vol s, MOD A4C: 89.0 ml LV SV MOD A2C:     81.0 ml LV SV MOD A4C:     153.0 ml LV SV MOD BP:      77.5 ml RIGHT VENTRICLE             IVC RV Basal diam:  4.40 cm     IVC diam: 2.30 cm RV S prime:     12.90 cm/s TAPSE (M-mode): 2.1 cm LEFT ATRIUM              Index        RIGHT ATRIUM           Index LA diam:        4.40 cm  2.17 cm/m   RA Area:     19.90 cm LA Vol (A2C):   77.3 ml  38.18 ml/m  RA Volume:   62.10 ml  30.68 ml/m LA Vol (A4C):   103.0 ml 50.88 ml/m LA Biplane Vol: 92.2 ml  45.54 ml/m   AORTA Ao Root diam: 4.00 cm Ao Asc diam:  4.00 cm MITRAL VALVE                  TRICUSPID VALVE MV Area (PHT):  7.66 cm       TR Peak grad:   28.7 mmHg MV Decel Time: 99 msec        TR Vmax:        268.00 cm/s MR Peak grad:    127.2 mmHg MR Mean grad:  89.0 mmHg    SHUNTS MR Vmax:         564.00 cm/s  Systemic Diam: 2.20 cm MR Vmean:        453.0 cm/s MR PISA:         1.01 cm MR PISA Eff ROA: 5 mm MR PISA Radius:  0.40 cm MV E velocity: 103.00 cm/s MV A velocity: 54.00 cm/s MV E/A ratio:  1.91 Morene Brownie Electronically signed by Morene Brownie Signature Date/Time: 05/15/2024/6:51:57 PM    Final (Updated)    US  RENAL Result Date: 05/15/2024 CLINICAL DATA:  Acute renal failure EXAM: RENAL / URINARY TRACT ULTRASOUND COMPLETE COMPARISON:  None Available. FINDINGS: Right Kidney: Renal measurements: 9.7 x 3.9 x 4.2 cm = volume: 82 mL. Renal cortical echogenicity is diffusely increased in keeping with changes of underlying medical renal disease. Cortical thickness is preserved. No intrarenal masses or calcifications. No hydronephrosis. No perinephric fluid collections are seen. Left Kidney: Renal measurements: 10.1 x 4.1 x 3.4 cm = volume: 73 mL. Renal cortical echogenicity is diffusely increased in keeping with changes of underlying medical renal disease. Cortical thickness is preserved. No intrarenal masses or calcifications. No hydronephrosis. No perinephric fluid collections are seen. 2.3 cm simple cyst is seen within the interpolar region of the left kidney for which no follow-up imaging is recommended. Bladder: Appears normal for degree of bladder distention. Other: Moderate left pleural effusion. IMPRESSION: 1. Diffusely increased renal cortical echogenicity in keeping with changes of underlying medical renal disease. No hydronephrosis. 2. Moderate left pleural effusion. Electronically Signed   By: Dorethia Molt M.D.   On: 05/15/2024 01:58   DG Chest Port 1 View Result Date: 05/14/2024 EXAM: 1 VIEW XRAY OF THE CHEST 05/14/2024 06:01:04 AM COMPARISON: 10/30/2023 CLINICAL HISTORY: Shortness of breath. SOB x 2  weeks, worse tonight. Pt reports non-compliance with blood pressure meds x 6 months. Hx diabetes and HTN. FINDINGS: LUNGS AND PLEURA: No focal pulmonary opacity. No pulmonary edema. No pleural effusion. No pneumothorax. Mild diffuse increase in interstitial markings. HEART AND MEDIASTINUM: Stable mild cardiac enlargement. BONES AND SOFT TISSUES: No acute osseous abnormality. Osseous structures appear intact. IMPRESSION: 1. Stable mild cardiac enlargement with mild diffuse increase in interstitial markings. Increased interstitial markings may be seen with pulmonary edema or atypical infection 2. No pleural effusion or consolidative change. Electronically signed by: Waddell Calk MD 05/14/2024 06:13 AM EDT RP Workstation: [id number]    Microbiology: Results for orders placed or performed during the hospital encounter of 05/14/24  MRSA Next Gen by PCR, Nasal     Status: None   Collection Time: 05/14/24  8:15 PM   Specimen: Nasal Mucosa; Nasal Swab  Result Value Ref Range Status   MRSA by PCR Next Gen NOT DETECTED NOT DETECTED Final    Comment: (NOTE) The GeneXpert MRSA Assay (FDA approved for NASAL specimens only), is one component of a comprehensive MRSA colonization surveillance program. It is not intended to diagnose MRSA infection nor to guide or monitor treatment for MRSA infections. Test performance is not FDA approved in patients less than 50 years old. Performed at [facility], [address] [place], [place] [address]     Labs: CBC: Recent Labs  Lab 05/14/24 0548 05/15/24 0012 05/16/24 0411 05/17/24 0255  WBC 10.5 10.0 11.5* 10.9*  NEUTROABS 7.5 7.1  --  7.8*  HGB 10.0* 8.7* 9.4* 9.9*  HCT 30.5* 27.0* 28.8* 30.6*  MCV 82.7 83.6 82.8 83.2  PLT 298 254 289 288  Basic Metabolic Panel: Recent Labs  Lab 05/14/24 0548 05/14/24 1419 05/15/24 0012 05/15/24 1454 05/15/24 1455 05/16/24 0411 05/17/24 0255  NA 140 138 136  --  137 139 138  K 3.6 3.6 3.9  --  3.8 3.6  3.5  CL 101 102 103  --  104 103 100  CO2 21* 19* 20*  --  20* 20* 23  GLUCOSE 105* 140* 149*  --  119* 95 108*  BUN 87* 94* 95*  --  95* 93* 70*  CREATININE 11.90* 12.00* 12.07*  --  12.49* 12.88* 10.85*  CALCIUM 9.0 8.6* 8.3*  --  8.2* 8.2* 8.7*  MG 2.3  --  2.2  --   --   --   --   PHOS  --   --  7.9* 7.9* 7.9*  --   --    Liver Function Tests: Recent Labs  Lab 05/14/24 0548 05/15/24 0012 05/15/24 1455  AST 21 16  --   ALT 23 18  --   ALKPHOS 74 52  --   BILITOT 0.6 0.5  --   PROT 6.8 5.7*  --   ALBUMIN 4.1 2.9* 2.8*   CBG: Recent Labs  Lab 05/17/24 1219 05/17/24 1542 05/17/24 2119 05/18/24 0610 05/18/24 1108  GLUCAP 88 153* 141* 96 103*    Discharge time spent: 45 minutes.  Signed: Deliliah Room, MD [facility] 05/18/2024

## 2024-05-18 NOTE — Progress Notes (Signed)
 53 y.o. yo male admitted 05/14/2024 with acute on chronic CHF and ARF. PMH significant for CKD stage IV/V, HTN, DM p;/w SOB x2weeks, hypertensive crisis. Naproxen 2-3 times a week for not feeling well.  Off his medications about 6 months ago, reports that the medications he was taking caused him to feel nauseous.  Had been following with [facility]. [facility].  Assessment/Plan:  Acute Renal Failure, progressing to ESRD  Prior CKD stage V, eGFR 14  Presented to ED with Scr 11.90, baseline Scr 4.59 noted 10/2023; labs pertinent for BUN 87. UA showed small blood and proteinuria. FeNa 17% indicating intrinsic cause, post IV lasix  80 mg. RUQ noted diffusely increased renal cortical echogenicity, no hydronephrosis.  No obstruction noted.  Patient stopped taking all of his medications about 6 months ago, etiology likely in the setting of chronic uncontrolled hypertension and volume overload resulting in acute renal failure. Unfortunately he has just progressed to ESRD; he was told a while ago that he had very advanced kidney disease which will need dialysis in the near future which scared the patient away but he understands the need for dialysis at this current time.   - Appreciate RIJ TC by VIR on 7/31   - Tolerated HD #1 Th and on for #2 today. - He has a Monday Wednesday Friday spot at [facility] second shift; he can start on Monday with a 1215 chair time but needs to be there at 1145 the first day.  - Appreciate [facility] seeing the patient; they will obtain these as being and schedule outpatient placement of the access given scheduling conflicts  - Renally dose medications  - Strict Is and Os and daily weights - RESTRICTING LEFT ARM for future fistula. BP, IV and Labs on the R arm.    Iron  deficiency Anemia Anemia of chronic kidney disease  Presented with Hgb 9.4. Iron  studies indicating low saturation ratios 9 and iron  31.   - loading  w/ IV iron  replacement therapy -> venofer  200mg  daily  (started 7/30)   Renal osteodystrophy Phosphorus 7.9 (start Auryxia  2), 25 vitamin D  very low  and PTH level (350); cholecalciferol  5000U MWF, calcitriol  0.25 mwf   Hypertensive Crisis  Resistant HTN/hypertensive cardiomyopathy  Patient stopped taking all medications about 6 months ago. Currently on amlodipine  10 mg, UF with dialysis will help as well.    HFrecEF Exacerbation Elevated troponins  Moderate left pleural effusion BNP 1,373 with symptoms of DOE and volume overload.  Chest x-ray noted mild diffuse increase in interstitial markings, edema.  ECHO pending. - manage by primary  Subjective:    Patient is evaluated at bedside, no apparent distress.  Chest pain improving and denies shortness of breath, abdominal pain.  Denies any nausea or vomiting.  No issues with dialysis on Friday; mother at bedside updated  Objective Vital signs in last 24 hours: Vitals:   05/17/24 2300 05/18/24 0429 05/18/24 0716 05/18/24 0806  BP: (!) 134/104 (!) 161/107 (!) 153/99 (!) 153/99  Pulse: 87 93  (!) 107  Resp: 16 17    Temp: 98.5 F (36.9 C) 98.2 F (36.8 C) 98.3 F (36.8 C)   TempSrc: Oral Oral Oral   SpO2: 100% 98%    Weight:  74.8 kg    Height:       Weight change: -1.6 kg  Intake/Output Summary (Last 24 hours) at 05/18/2024 1029 Last data filed at 05/17/2024 2305 Gross per 24 hour  Intake 840 ml  Output 1300 ml  Net -460 ml    Labs: Basic Metabolic Panel: Recent Labs  Lab 05/15/24 0012 05/15/24 1454 05/15/24 1455 05/16/24 0411 05/17/24 0255  NA 136  --  137 139 138  K 3.9  --  3.8 3.6 3.5  CL 103  --  104 103 100  CO2 20*  --  20* 20* 23  GLUCOSE 149*  --  119* 95 108*  BUN 95*  --  95* 93* 70*  CREATININE 12.07*  --  12.49* 12.88* 10.85*  CALCIUM 8.3*  --  8.2* 8.2* 8.7*  PHOS 7.9* 7.9* 7.9*  --   --    Liver Function Tests: Recent Labs  Lab 05/14/24 0548 05/15/24 0012 05/15/24 1455  AST 21 16  --   ALT 23 18  --   ALKPHOS 74 52  --   BILITOT 0.6 0.5  --    PROT 6.8 5.7*  --   ALBUMIN 4.1 2.9* 2.8*   No results for input(s): LIPASE, AMYLASE in the last 168 hours. No results for input(s): AMMONIA in the last 168 hours. CBC: Recent Labs  Lab 05/14/24 0548 05/15/24 0012 05/16/24 0411 05/17/24 0255  WBC 10.5 10.0 11.5* 10.9*  NEUTROABS 7.5 7.1  --  7.8*  HGB 10.0* 8.7* 9.4* 9.9*  HCT 30.5* 27.0* 28.8* 30.6*  MCV 82.7 83.6 82.8 83.2  PLT 298 254 289 288   Cardiac Enzymes: No results for input(s): CKTOTAL, CKMB, CKMBINDEX, TROPONINI in the last 168 hours. CBG: Recent Labs  Lab 05/17/24 0622 05/17/24 1219 05/17/24 1542 05/17/24 2119 05/18/24 0610  GLUCAP 98 88 153* 141* 96    Iron  Studies:  No results for input(s): IRON , TIBC, TRANSFERRIN, FERRITIN in the last 72 hours.  Studies/Results: No results found.   Medications: Infusions:  iron  sucrose 200 mg (05/18/24 0817)    Scheduled Medications:  amLODipine   10 mg Oral Daily   calcitRIOL   0.25 mcg Oral Q M,W,F   carvedilol   6.25 mg Oral BID WC   Chlorhexidine  Gluconate Cloth  6 each Topical Q0600   cholecalciferol   5,000 Units Oral Q M,W,F   ferric citrate   420 mg Oral TID WC   furosemide   80 mg Intravenous BID   hydrALAZINE   50 mg Oral Q8H   insulin  aspart  0-6 Units Subcutaneous TID WC    have reviewed scheduled and prn medications.  Physical Exam: General: Laying bed, no acute distress Heart: Regular rate Lungs: Breathing comfortably Abdomen: Soft, nontender, nondistended Extremities: No lower extremity edema bilateral lower extremities Dialysis Access: Right IJ TC

## 2024-05-18 NOTE — ED Provider Notes (Signed)
 [place] EMERGENCY DEPARTMENT AT [facility] Provider Note   CSN: [id number] Arrival date & time: 05/18/24  1846     Patient presents with: No chief complaint on file.   Bobby Stevens is a 53 y.o. male w/ hx of ESRD here with lightheadedness.  Patient was just discharged from hospital today, reports that after leaving he began to feel very weak and lightheaded with standing, like my legs are going to give out. Denies LOC or headache or SOB.  The patient reports he had lightheadedness problems even before his recent hospitalization.  Medical records reviewed -discharge summary - patient admitted for HTN urgency, acute renal failure w/ progression to HD, with his last session done yesterday, and next session scheduled on Monday.  His renal failure was felt to be due to poorly controlled HTN. He has CHF with EF 30-35% during 7/30 echo.  He is on lasix  on alternating days and coreg , hydralazine  and amlodipine  for HTN.  Also on imdur  30 mg daily   TDC placed right jugular by IR on 7/31  He is also diabetic on insulin    HPI     Prior to Admission medications   Medication Sig Start Date End Date Taking? Authorizing Provider  amLODipine  (NORVASC ) 10 MG tablet Take 1 tablet (10 mg total) by mouth daily. Patient not taking: Reported on 05/14/2024 10/30/23   Prosperi, Christian H, PA-C  calcitRIOL  (ROCALTROL ) 0.25 MCG capsule Take 1 capsule (0.25 mcg total) by mouth every Monday, Wednesday, and Friday. 05/20/24   Rashid, Farhan, MD  carvedilol  (COREG ) 25 MG tablet Take 0.5 tablet (12.5 mg total) by mouth 2 (two) times daily. 05/18/24   Rashid, Farhan, MD  vitamin D3 (CHOLECALCIFEROL ) 25 MCG tablet Take 5 tablets (5,000 Units total) by mouth every Monday, Wednesday, and Friday. 05/20/24   Dino Antu, MD  ferric citrate  (AURYXIA ) 1 GM 210 MG(Fe) tablet Take 2 tablets (420 mg total) by mouth 3 (three) times daily with meals. 05/18/24   Dino Antu, MD  ferrous sulfate  325 (65 FE) MG  tablet Take 6 tablets (1,950 mg total) by mouth in the morning, at noon, and at bedtime. 05/18/24   Rashid, Farhan, MD  furosemide  (LASIX ) 80 MG tablet Take 1 tablet (80 mg total) by mouth every Tuesday, Thursday, Saturday, and Sunday. 05/18/24   Rashid, Farhan, MD  hydrALAZINE  (APRESOLINE ) 50 MG tablet Take 0.5 tablets (25 mg total) by mouth every 8 (eight) hours. 05/18/24   Cottie Donnice PARAS, MD  isosorbide  mononitrate (IMDUR ) 30 MG 24 hr tablet Take 1 tablet (30 mg total) by mouth daily. Patient not taking: Reported on 05/14/2024 10/30/23   Prosperi, Christian H, PA-C    Allergies: Penicillins    Review of Systems  Updated Vital Signs BP (!) 145/105   Pulse 86   Temp 98.9 F (37.2 C) (Oral)   Resp 19   SpO2 100%   Physical Exam Constitutional:      General: He is not in acute distress. HENT:     Head: Normocephalic and atraumatic.     Comments: Temporary dialysis site right IJ Eyes:     Conjunctiva/sclera: Conjunctivae normal.     Pupils: Pupils are equal, round, and reactive to light.  Cardiovascular:     Rate and Rhythm: Normal rate and regular rhythm.  Pulmonary:     Effort: Pulmonary effort is normal. No respiratory distress.  Abdominal:     General: There is no distension.     Tenderness: There is no  abdominal tenderness.  Skin:    General: Skin is warm and dry.  Neurological:     General: No focal deficit present.     Mental Status: He is alert and oriented to person, place, and time. Mental status is at baseline.     Cranial Nerves: No cranial nerve deficit.     Sensory: No sensory deficit.     Motor: No weakness.  Psychiatric:        Mood and Affect: Mood normal.        Behavior: Behavior normal.     (all labs ordered are listed, but only abnormal results are displayed) Labs Reviewed  BASIC METABOLIC PANEL WITH GFR - Abnormal; Notable for the following components:      Result Value   Chloride 96 (*)    Glucose, Bld 120 (*)    BUN 65 (*)    Creatinine, Ser  10.02 (*)    GFR, Estimated 6 (*)    Anion gap 16 (*)    All other components within normal limits  CBC WITH DIFFERENTIAL/PLATELET - Abnormal; Notable for the following components:   WBC 13.8 (*)    RBC 3.77 (*)    Hemoglobin 10.1 (*)    HCT 32.3 (*)    Neutro Abs 10.6 (*)    Eosinophils Absolute 1.7 (*)    All other components within normal limits  CBG MONITORING, ED - Abnormal; Notable for the following components:   Glucose-Capillary 107 (*)    All other components within normal limits  TROPONIN I (HIGH SENSITIVITY) - Abnormal; Notable for the following components:   Troponin I (High Sensitivity) 84 (*)    All other components within normal limits    EKG: EKG Interpretation Date/Time:  Saturday May 18 2024 18:53:57 EDT Ventricular Rate:  85 PR Interval:  155 QRS Duration:  102 QT Interval:  445 QTC Calculation: 530 R Axis:   8  Text Interpretation: Sinus rhythm Biatrial enlargement Left ventricular hypertrophy Prolonged QT interval Confirmed by Cottie Cough [id number]) on 05/18/2024 7:06:02 PM  Radiology: DG Chest Portable 1 View Result Date: 05/18/2024 CLINICAL DATA:  Shortness of breath. EXAM: PORTABLE CHEST 1 VIEW COMPARISON:  05/14/2024. FINDINGS: Stable cardiomegaly. Right IJ dialysis catheter tip overlies the proximal right atrium. No overt edema, focal consolidation, pleural effusion, or pneumothorax. No acute osseous abnormality. IMPRESSION: 1. No acute cardiopulmonary findings. 2. Cardiomegaly. Electronically Signed   By: Harrietta Sherry M.D.   On: 05/18/2024 19:40     Procedures   Medications Ordered in the ED  sodium chloride  0.9 % bolus 500 mL (500 mLs Intravenous New Bag/Given 05/18/24 2036)    Clinical Course as of 05/18/24 2112  Sat May 18, 2024  2018 Orthostatic VS negative but pt still feeling lightheaded standing - ordered 500 cc fluid.  [MT]  2029 I spoke to Dr Melia from nephrology who feels this is unlikely DDS given very small and slow amounts of  fluid taken during dialysis sessions.  We agree this may more likely be related to sudden control in blood pressure/hypoperfusion, and have discussed reducing his hydralazine  from 50 mg TID to 25 mg TID, and allowing the nephrologist to continue to titrate as needed outpatient. [MT]    Clinical Course User Index [MT] Deone Leifheit, Cough PARAS, MD                                 Medical Decision Making Amount and/or  Complexity of Data Reviewed Labs: ordered. Radiology: ordered.  Risk Prescription drug management.   This patient presents to the ED with concern for near syncope/lightheadedness. This involves an extensive number of treatment options, and is a complaint that carries with it a high risk of complications and morbidity.  The differential diagnosis includes anemia vs fluid shifts/dialysis syndrome vs infection vs orthostatic hypotension/dehydration vs other  Co-morbidities that complicate the patient evaluation: ESRD on dialysis  External records from outside source obtained and reviewed including recent hospital discharge summary, echo, and nephrology notes  I ordered and personally interpreted labs.  The pertinent results include:  no emergent findings.  Labs are near baseline discharge levels, including mild chronic leukocytosis and trop elevation.  I ordered imaging studies including dg chest I independently visualized and interpreted imaging which showed no emergent findings I agree with the radiologist interpretation  The patient was maintained on a cardiac monitor.  I personally viewed and interpreted the cardiac monitored which showed an underlying rhythm of: regular HR  Per my interpretation the patient's ECG shows no acute ischemia  I ordered medication including IV fluids for lightheadedness  I have reviewed the patients home medicines and have made adjustments as needed  Test Considered: doubt CVA, stroke, meningitis, ACS, PE  I suspect this may be dialysis  disequilibrium or else related to changes in his chronic HTN/ mild hypoperfusion, given the aggressive correction of his HTN in the hospital recently. He's had a recent echocardiogram and full workup otherwise and likely would not require hospitalization unless he is not steady standing/walking.  We'll reassess after IV fluids.    After the interventions noted above, I reevaluated the patient and found that they have: improved  Clinical Course as of 05/18/24 2112  Sat May 18, 2024  2018 Orthostatic VS negative but pt still feeling lightheaded standing - ordered 500 cc fluid.  [MT]  2029 I spoke to Dr Melia from nephrology who feels this is unlikely DDS given very small and slow amounts of fluid taken during dialysis sessions.  We agree this may more likely be related to sudden control in blood pressure/hypoperfusion, and have discussed reducing his hydralazine  from 50 mg TID to 25 mg TID, and allowing the nephrologist to continue to titrate as needed outpatient. [MT]    Clinical Course User Index [MT] Cottie Donnice PARAS, MD      Disposition:  After consideration of the diagnostic results and the patient's response to treatment, I feel that the patient would benefit from close outpatient follow up; attend next scheduled dialysis session Monday.      Final diagnoses:  Lightheadedness    ED Discharge Orders          Ordered    hydrALAZINE  (APRESOLINE ) 50 MG tablet  Every 8 hours        05/18/24 2035               Cottie Donnice PARAS, MD 05/18/24 2112

## 2024-05-18 NOTE — Progress Notes (Signed)
 Pt is alert and fully oriented x 4, stable hemodynamically, NSR on the monitor, afebrile, normal respiratory effort, no acute distress noted overnight. He is able to rest well with no major complaints. Plan of care is reviewed. Pt has been progressing. We will continue to monitor.  Wendi Dash, RN

## 2024-05-18 NOTE — Discharge Instructions (Addendum)
 HD on Monday, Wednesday, Friday at [facility]; start on Monday 05/20/24 with a 1215 chair time but needs to be there at 1145 the first day.

## 2024-05-18 NOTE — Discharge Instructions (Signed)
 Please make sure to attend your next dialysis session on Monday.  It is possible you are feeling dizzy or lightheaded because of the sudden changes in your blood pressure, now on medications.  This can often make the brain feel dizzy, and may take a few days or weeks to normalize.  We will cut back your dose of hydralazine  from 50 mg three times daily, to 25 mg three times daily.  Your kidney doctor will follow up on this issue and will continue to help control your blood pressure moving forward.

## 2024-05-18 NOTE — Progress Notes (Signed)
 AVS went over with patient, answered any/all questions, ivs removed, TOC meds given, patient belongings gathered and patient was wheeled out to family vehicle.

## 2024-05-19 ENCOUNTER — Telehealth (HOSPITAL_COMMUNITY): Payer: Self-pay | Admitting: Nephrology

## 2024-05-19 NOTE — Telephone Encounter (Signed)
 Transition of care contact from inpatient facility  Date of Discharge: 05/18/24  Date of Contact: 05/19/24 - attempt #1 Method of contact: Phone  Attempted to contact patient to discuss transition of care from inpatient admission. Patient did not answer the phone. Message was left on the patient's voicemail with call back number [phone].   Izetta Boehringer, PA-C [employer] Pager [phone]

## 2024-05-20 ENCOUNTER — Other Ambulatory Visit: Payer: Self-pay

## 2024-05-20 ENCOUNTER — Telehealth: Payer: Self-pay

## 2024-05-20 NOTE — Transitions of Care (Post Inpatient/ED Visit) (Signed)
   05/20/2024  Name: Bobby Stevens MRN: [id number] DOB: [birth year]  Today's TOC FU Call Status: Today's TOC FU Call Status:: Unsuccessful Call (1st Attempt) Unsuccessful Call (1st Attempt) Date: 05/20/24  Attempted to reach the patient regarding the most recent Inpatient/ED visit.  Follow Up Plan: Additional outreach attempts will be made to reach the patient to complete the Transitions of Care (Post Inpatient/ED visit) call.   Signature  Slater Diesel, RN

## 2024-05-20 NOTE — Progress Notes (Signed)
 Late Note entry 05/20/24 Noted that pt discharged over weekend. Contacted [facility] to inform of arrival today for HD appointment. D/c summary and last progress note faxed over.   Shaida Route Dialysis Navigator [phone]

## 2024-05-21 ENCOUNTER — Telehealth: Payer: Self-pay

## 2024-05-21 NOTE — Transitions of Care (Post Inpatient/ED Visit) (Signed)
 05/21/2024  Name: Bobby Stevens MRN: [id number] DOB: Jan 18, 1971  Today's TOC FU Call Status: Today's TOC FU Call Status:: Successful TOC FU Call Completed Unsuccessful Call (1st Attempt) Date: 05/20/24 [facility] FU Call Complete Date: 05/21/24 Patient's Name and Date of Birth confirmed.  Transition Care Management Follow-up Telephone Call Date of Discharge: 05/18/24 Discharge Facility: [facility]) Type of Discharge: Inpatient Admission Primary Inpatient Discharge Diagnosis:: acute on chronic diastolic CHF How have you been since you were released from the hospital?: Better (He said this is a journey and he is trying to figure things out. He needs to adjust to accommodating to a dialysis schedule.) Any questions or concerns?: Yes Patient Questions/Concerns:: He had questions about medication refills.  He said he has medications at 2 pharmacies and is not sure how he will get refills because there are no refills with the medications he has. I explained to him that the  hospital does not usually give refills, he will need to establish care with a PCP and follow up with specialists to obtain the refills he needs.  His appointment with Rosaline Bohr, NP is not until 06/18/2024 and i explained that is cutting it close to running out of meds and I can schedule him an appointment to be seen sooner but he declined. I also explained that we have a MMU in the community and they are generally in [facility] once a month if he needs to be seen prior to his scheduled appointment.  When he meet with a PCP he said he wants to ask about acid reflux and he may need a referral to GI Patient Questions/Concerns Addressed: Other: (recommendations noted above.)  Items Reviewed: Did you receive and understand the discharge instructions provided?: Yes Medications obtained,verified, and reconciled?: Yes (Medications Reviewed) (He said he has all of his meds but the call was lost before we could review the list. I  called him back and reviewed the med list. He does not have Auryxia  and I gave him the number for the [facility] pharmacy and instructed him to call to check on that rx.) Any new allergies since your discharge?: No Dietary orders reviewed?: Yes Type of Diet Ordered:: heart healthy, low sodium Do you have support at home?: Yes People in Home [RPT]: parent(s) Name of Support/Comfort Primary Source: his mother  Medications Reviewed Today: Medications Reviewed Today     Reviewed by Marvis Bradley, RN (Case Manager) on 05/21/24 at 1804  Med List Status: <None>   Medication Order Taking? Sig Documenting Provider Last Dose Status Informant  amLODipine  (NORVASC ) 10 MG tablet [id number]  Take 1 tablet (10 mg total) by mouth daily.  Patient not taking: Reported on 05/14/2024   Rosan Sherlean DEL, PA-C  Active Self  calcitRIOL  (ROCALTROL ) 0.25 MCG capsule [id number]  Take 1 capsule (0.25 mcg total) by mouth every Monday, Wednesday, and Friday. Rashid, Farhan, MD  Active   carvedilol  (COREG ) 25 MG tablet [id number]  Take 0.5 tablet (12.5 mg total) by mouth 2 (two) times daily. Rashid, Farhan, MD  Active   ferric citrate  (AURYXIA ) 1 GM 210 MG(Fe) tablet [id number]  Take 2 tablets (420 mg total) by mouth 3 (three) times daily with meals. Dino Antu, MD  Active   ferrous sulfate  325 (65 FE) MG tablet [id number]  Take 6 tablets (1,950 mg total) by mouth in the morning, at noon, and at bedtime. Rashid, Farhan, MD  Active   furosemide  (LASIX ) 80 MG tablet [id number]  Take 1 tablet (80 mg  total) by mouth every Tuesday, Thursday, Saturday, and Sunday. Rashid, Farhan, MD  Active   hydrALAZINE  (APRESOLINE ) 50 MG tablet [id number]  Take 0.5 tablets (25 mg total) by mouth every 8 (eight) hours. Cottie Donnice PARAS, MD  Active   isosorbide  mononitrate (IMDUR ) 30 MG 24 hr tablet [id number]  Take 1 tablet (30 mg total) by mouth daily.  Patient not taking: Reported on 05/14/2024   Rosan Sherlean DEL, PA-C  Active Self  vitamin  D3 (CHOLECALCIFEROL ) 25 MCG tablet [id number]  Take 5 tablets (5,000 Units total) by mouth every Monday, Wednesday, and Friday. Dino Antu, MD  Active             Home Care and Equipment/Supplies: Were Home Health Services Ordered?: No Any new equipment or medical supplies ordered?: No  Functional Questionnaire: Do you need assistance with bathing/showering or dressing?: No Do you need assistance with meal preparation?: No Do you need assistance with eating?: No Do you have difficulty maintaining continence: No Do you need assistance with getting out of bed/getting out of a chair/moving?: No Do you have difficulty managing or taking your medications?: No  Follow up appointments reviewed: PCP Follow-up appointment confirmed?: Yes Date of PCP follow-up appointment?: 06/18/24 Follow-up Provider: Rosaline Bohr, NP.   He has not seen Ms Bohr since 11/2020 and does not have another PCP. I offered to reschedule him to see Ms Bohr sooner but he declined. I also offered to schedule him at another clinic, [facility], which is closer to his home but he declined. I emphasized the importance of establishing care with a PCP and he said he understood and would call the clinic if he wanted to be seen  before 06/18/2024. Specialist Hospital Follow-up appointment confirmed?: Yes Date of Specialist follow-up appointment?: 05/20/24 Follow-Up Specialty Provider:: He has dialysis MWF Do you need transportation to your follow-up appointment?: No (He does not need transportation at this time, but is not sure if he will always have a car available to transport him. I provided him with the number for [facility] [employer]: [phone] and explained that they provide rides to medical appointments) Do you understand care options if your condition(s) worsen?: Yes-patient verbalized understanding    SIGNATURE Slater Diesel, RN

## 2024-06-07 ENCOUNTER — Telehealth: Payer: Self-pay

## 2024-06-07 NOTE — Telephone Encounter (Signed)
 Patient answered and acknowledge himself as patient.  When advising that I was calling for scheduling of L arm AVF or AVG, the line was disconnected.

## 2024-06-07 NOTE — Telephone Encounter (Signed)
 Patient answered again but stated he was at dialysis at this time.  I repeated the reason for my call and the line was disconnected.  Will send letter to home address on file.

## 2024-06-11 ENCOUNTER — Other Ambulatory Visit (HOSPITAL_BASED_OUTPATIENT_CLINIC_OR_DEPARTMENT_OTHER): Payer: Self-pay

## 2024-06-12 ENCOUNTER — Telehealth: Payer: Self-pay

## 2024-06-12 NOTE — Telephone Encounter (Signed)
 Multiple telephone calls made to patient in order to schedule L arm AVF vs AVG with Dr. Magda.  Patient would disconnect call with no call back.  Letter generated / paper work scanned.

## 2024-06-14 ENCOUNTER — Telehealth (INDEPENDENT_AMBULATORY_CARE_PROVIDER_SITE_OTHER): Payer: Self-pay | Admitting: Primary Care

## 2024-06-14 NOTE — Telephone Encounter (Signed)
 Called pt to confirm appt. Pt did not answer and LVM for pt.

## 2024-06-18 ENCOUNTER — Ambulatory Visit (INDEPENDENT_AMBULATORY_CARE_PROVIDER_SITE_OTHER): Admitting: Primary Care

## 2024-07-01 ENCOUNTER — Encounter (HOSPITAL_COMMUNITY): Payer: Self-pay | Admitting: Vascular Surgery

## 2024-07-03 ENCOUNTER — Ambulatory Visit (HOSPITAL_COMMUNITY): Admission: RE | Admit: 2024-07-03 | Source: Home / Self Care | Admitting: Vascular Surgery

## 2024-07-03 ENCOUNTER — Encounter (HOSPITAL_COMMUNITY): Admission: RE | Payer: Self-pay | Source: Home / Self Care

## 2024-07-03 SURGERY — TUNNELLED CATHETER EXCHANGE
Anesthesia: LOCAL

## 2024-07-12 ENCOUNTER — Other Ambulatory Visit: Payer: Self-pay

## 2024-07-12 MED ORDER — SEVELAMER CARBONATE 800 MG PO TABS
1600.0000 mg | ORAL_TABLET | Freq: Three times a day (TID) | ORAL | 3 refills | Status: AC
  Filled 2024-07-12: qty 180, 30d supply, fill #0

## 2024-07-16 ENCOUNTER — Other Ambulatory Visit: Payer: Self-pay

## 2024-08-06 ENCOUNTER — Other Ambulatory Visit (HOSPITAL_BASED_OUTPATIENT_CLINIC_OR_DEPARTMENT_OTHER): Payer: Self-pay

## 2024-08-19 ENCOUNTER — Other Ambulatory Visit (HOSPITAL_BASED_OUTPATIENT_CLINIC_OR_DEPARTMENT_OTHER): Payer: Self-pay

## 2024-08-29 ENCOUNTER — Other Ambulatory Visit (HOSPITAL_BASED_OUTPATIENT_CLINIC_OR_DEPARTMENT_OTHER): Payer: Self-pay

## 2024-08-30 ENCOUNTER — Other Ambulatory Visit (HOSPITAL_BASED_OUTPATIENT_CLINIC_OR_DEPARTMENT_OTHER): Payer: Self-pay

## 2024-10-01 ENCOUNTER — Other Ambulatory Visit: Payer: Self-pay

## 2024-10-01 MED ORDER — VELPHORO 500 MG PO CHEW
CHEWABLE_TABLET | ORAL | 3 refills | Status: AC
  Filled 2024-10-01: qty 90, 23d supply, fill #0

## 2024-10-04 ENCOUNTER — Other Ambulatory Visit: Payer: Self-pay

## 2024-10-09 ENCOUNTER — Other Ambulatory Visit: Payer: Self-pay

## 2024-10-11 ENCOUNTER — Other Ambulatory Visit: Payer: Self-pay

## 2024-10-14 ENCOUNTER — Other Ambulatory Visit (HOSPITAL_BASED_OUTPATIENT_CLINIC_OR_DEPARTMENT_OTHER): Payer: Self-pay

## 2024-10-16 ENCOUNTER — Other Ambulatory Visit (HOSPITAL_BASED_OUTPATIENT_CLINIC_OR_DEPARTMENT_OTHER): Payer: Self-pay

## 2024-10-16 MED ORDER — AMLODIPINE BESYLATE 10 MG PO TABS
10.0000 mg | ORAL_TABLET | Freq: Every day | ORAL | 3 refills | Status: AC
  Filled 2024-10-16 (×2): qty 90, 90d supply, fill #0

## 2024-10-21 ENCOUNTER — Other Ambulatory Visit: Payer: Self-pay

## 2024-10-21 MED ORDER — HYDRALAZINE HCL 50 MG PO TABS
50.0000 mg | ORAL_TABLET | Freq: Three times a day (TID) | ORAL | 4 refills | Status: AC
  Filled 2024-10-21: qty 270, 90d supply, fill #0

## 2024-10-31 ENCOUNTER — Other Ambulatory Visit: Payer: Self-pay

## 2024-11-08 ENCOUNTER — Other Ambulatory Visit: Payer: Self-pay

## 2024-11-08 MED ORDER — HYDRALAZINE HCL 50 MG PO TABS
ORAL_TABLET | ORAL | 4 refills | Status: AC
  Filled 2024-11-08: qty 135, 30d supply, fill #0

## 2024-11-08 MED ORDER — FUROSEMIDE 80 MG PO TABS
ORAL_TABLET | ORAL | 4 refills | Status: AC
  Filled 2024-11-08: qty 18, 30d supply, fill #0

## 2024-11-18 ENCOUNTER — Other Ambulatory Visit: Payer: Self-pay

## 2024-11-18 MED ORDER — ISOSORBIDE DINITRATE 30 MG PO TABS
30.0000 mg | ORAL_TABLET | Freq: Every day | ORAL | 3 refills | Status: AC
  Filled 2024-11-18: qty 30, 30d supply, fill #0

## 2024-11-20 ENCOUNTER — Other Ambulatory Visit: Payer: Self-pay
